# Patient Record
Sex: Male | Born: 1935 | Race: Black or African American | Hispanic: No | Marital: Married | Smoking: Never smoker
Health system: Southern US, Community
[De-identification: ages and names within clinical notes are randomized; demographics above are authoritative.]

## PROBLEM LIST (undated history)

## (undated) DIAGNOSIS — R4702 Dysphasia: Secondary | ICD-10-CM

## (undated) DIAGNOSIS — B1910 Unspecified viral hepatitis B without hepatic coma: Secondary | ICD-10-CM

## (undated) DIAGNOSIS — C801 Malignant (primary) neoplasm, unspecified: Secondary | ICD-10-CM

## (undated) DIAGNOSIS — I10 Essential (primary) hypertension: Secondary | ICD-10-CM

## (undated) DIAGNOSIS — F0390 Unspecified dementia without behavioral disturbance: Secondary | ICD-10-CM

## (undated) DIAGNOSIS — N189 Chronic kidney disease, unspecified: Secondary | ICD-10-CM

## (undated) DIAGNOSIS — R569 Unspecified convulsions: Principal | ICD-10-CM

## (undated) HISTORY — DX: Unspecified convulsions: R56.9

---

## 1997-08-25 ENCOUNTER — Emergency Department (HOSPITAL_COMMUNITY): Admission: EM | Admit: 1997-08-25 | Discharge: 1997-08-25 | Payer: Self-pay | Admitting: Emergency Medicine

## 1999-02-10 ENCOUNTER — Encounter: Admission: RE | Admit: 1999-02-10 | Discharge: 1999-02-10 | Payer: Self-pay | Admitting: Internal Medicine

## 1999-09-13 ENCOUNTER — Encounter: Payer: Self-pay | Admitting: Urology

## 1999-09-13 ENCOUNTER — Ambulatory Visit (HOSPITAL_COMMUNITY): Admission: RE | Admit: 1999-09-13 | Discharge: 1999-09-13 | Payer: Self-pay | Admitting: Urology

## 2004-02-15 ENCOUNTER — Ambulatory Visit: Payer: Self-pay | Admitting: Internal Medicine

## 2004-05-30 ENCOUNTER — Ambulatory Visit: Payer: Self-pay | Admitting: Internal Medicine

## 2004-08-29 ENCOUNTER — Ambulatory Visit: Payer: Self-pay | Admitting: Internal Medicine

## 2004-09-23 ENCOUNTER — Emergency Department (HOSPITAL_COMMUNITY): Admission: EM | Admit: 2004-09-23 | Discharge: 2004-09-24 | Payer: Self-pay | Admitting: Emergency Medicine

## 2004-11-20 ENCOUNTER — Ambulatory Visit: Payer: Self-pay | Admitting: Adult Health

## 2005-03-12 ENCOUNTER — Ambulatory Visit: Payer: Self-pay | Admitting: Internal Medicine

## 2005-04-12 ENCOUNTER — Ambulatory Visit: Payer: Self-pay | Admitting: Pulmonary Disease

## 2005-07-03 ENCOUNTER — Ambulatory Visit: Payer: Self-pay | Admitting: Internal Medicine

## 2005-07-10 ENCOUNTER — Ambulatory Visit: Payer: Self-pay | Admitting: Internal Medicine

## 2005-07-13 ENCOUNTER — Ambulatory Visit: Payer: Self-pay | Admitting: Internal Medicine

## 2005-07-27 ENCOUNTER — Ambulatory Visit: Payer: Self-pay | Admitting: Internal Medicine

## 2005-09-03 ENCOUNTER — Ambulatory Visit: Payer: Self-pay | Admitting: Internal Medicine

## 2005-10-15 ENCOUNTER — Ambulatory Visit: Payer: Self-pay | Admitting: Internal Medicine

## 2005-11-19 ENCOUNTER — Ambulatory Visit: Payer: Self-pay | Admitting: Internal Medicine

## 2006-01-07 ENCOUNTER — Ambulatory Visit: Payer: Self-pay | Admitting: Internal Medicine

## 2006-04-08 ENCOUNTER — Ambulatory Visit: Payer: Self-pay | Admitting: Internal Medicine

## 2006-04-22 ENCOUNTER — Ambulatory Visit: Payer: Self-pay | Admitting: Internal Medicine

## 2006-07-17 ENCOUNTER — Ambulatory Visit: Payer: Self-pay | Admitting: Internal Medicine

## 2006-07-26 ENCOUNTER — Ambulatory Visit: Payer: Self-pay | Admitting: Internal Medicine

## 2007-01-16 DIAGNOSIS — Z87898 Personal history of other specified conditions: Secondary | ICD-10-CM

## 2008-01-04 ENCOUNTER — Emergency Department (HOSPITAL_COMMUNITY): Admission: EM | Admit: 2008-01-04 | Discharge: 2008-01-04 | Payer: Self-pay | Admitting: Emergency Medicine

## 2008-10-18 ENCOUNTER — Ambulatory Visit: Payer: Self-pay | Admitting: Diagnostic Radiology

## 2008-10-18 ENCOUNTER — Emergency Department (HOSPITAL_BASED_OUTPATIENT_CLINIC_OR_DEPARTMENT_OTHER): Admission: EM | Admit: 2008-10-18 | Discharge: 2008-10-18 | Payer: Self-pay | Admitting: Emergency Medicine

## 2008-10-21 ENCOUNTER — Emergency Department (HOSPITAL_BASED_OUTPATIENT_CLINIC_OR_DEPARTMENT_OTHER): Admission: EM | Admit: 2008-10-21 | Discharge: 2008-10-21 | Payer: Self-pay | Admitting: Emergency Medicine

## 2009-09-16 ENCOUNTER — Ambulatory Visit: Payer: Self-pay | Admitting: Hematology and Oncology

## 2009-09-20 ENCOUNTER — Other Ambulatory Visit: Admission: RE | Admit: 2009-09-20 | Discharge: 2009-09-20 | Payer: Self-pay | Admitting: Hematology and Oncology

## 2009-09-20 LAB — CBC & DIFF AND RETIC
Basophils Absolute: 0 10*3/uL (ref 0.0–0.1)
Eosinophils Absolute: 0.1 10*3/uL (ref 0.0–0.5)
HCT: 38.8 % (ref 38.4–49.9)
HGB: 13 g/dL (ref 13.0–17.1)
MCV: 97.2 fL (ref 79.3–98.0)
MONO%: 8.4 % (ref 0.0–14.0)
NEUT#: 1 10*3/uL — ABNORMAL LOW (ref 1.5–6.5)
NEUT%: 31.2 % — ABNORMAL LOW (ref 39.0–75.0)
RDW: 13.8 % (ref 11.0–14.6)
Retic %: 0.84 % (ref 0.50–1.60)
Retic Ct Abs: 33.52 10*3/uL (ref 24.10–77.50)

## 2009-09-20 LAB — MORPHOLOGY

## 2009-09-22 LAB — COMPREHENSIVE METABOLIC PANEL
ALT: 12 U/L (ref 0–53)
Alkaline Phosphatase: 82 U/L (ref 39–117)
Sodium: 143 mEq/L (ref 135–145)
Total Bilirubin: 0.6 mg/dL (ref 0.3–1.2)
Total Protein: 7 g/dL (ref 6.0–8.3)

## 2009-09-22 LAB — PROTEIN ELECTROPHORESIS, SERUM
Beta 2: 17.1 % — ABNORMAL HIGH (ref 3.2–6.5)
Beta Globulin: 5.2 % (ref 4.7–7.2)
Gamma Globulin: 6.4 % — ABNORMAL LOW (ref 11.1–18.8)
M-Spike, %: 0.74 g/dL

## 2009-09-22 LAB — LACTATE DEHYDROGENASE: LDH: 131 U/L (ref 94–250)

## 2009-09-23 ENCOUNTER — Ambulatory Visit (HOSPITAL_COMMUNITY): Admission: RE | Admit: 2009-09-23 | Discharge: 2009-09-23 | Payer: Self-pay | Admitting: Hematology and Oncology

## 2009-09-29 ENCOUNTER — Other Ambulatory Visit: Admission: RE | Admit: 2009-09-29 | Discharge: 2009-09-29 | Payer: Self-pay | Admitting: Hematology and Oncology

## 2009-09-29 LAB — CBC WITH DIFFERENTIAL/PLATELET
BASO%: 0.5 % (ref 0.0–2.0)
Basophils Absolute: 0 10*3/uL (ref 0.0–0.1)
EOS%: 3.4 % (ref 0.0–7.0)
Eosinophils Absolute: 0.1 10*3/uL (ref 0.0–0.5)
HCT: 36.5 % — ABNORMAL LOW (ref 38.4–49.9)
HGB: 12.2 g/dL — ABNORMAL LOW (ref 13.0–17.1)
LYMPH%: 51.9 % — ABNORMAL HIGH (ref 14.0–49.0)
MCH: 33.2 pg (ref 27.2–33.4)
MCHC: 33.4 g/dL (ref 32.0–36.0)
MCV: 99.3 fL — ABNORMAL HIGH (ref 79.3–98.0)
MONO#: 0.3 10*3/uL (ref 0.1–0.9)
MONO%: 9.9 % (ref 0.0–14.0)
NEUT#: 1 10*3/uL — ABNORMAL LOW (ref 1.5–6.5)
NEUT%: 34.3 % — ABNORMAL LOW (ref 39.0–75.0)
Platelets: 124 10*3/uL — ABNORMAL LOW (ref 140–400)
RBC: 3.68 10*6/uL — ABNORMAL LOW (ref 4.20–5.82)
RDW: 14.4 % (ref 11.0–14.6)
WBC: 2.9 10*3/uL — ABNORMAL LOW (ref 4.0–10.3)
lymph#: 1.5 10*3/uL (ref 0.9–3.3)

## 2009-09-30 LAB — PROTEIN ELECTROPHORESIS, SERUM, WITH REFLEX
Alpha-1-Globulin: 3.4 % (ref 2.9–4.9)
Alpha-2-Globulin: 7.7 % (ref 7.1–11.8)
Beta 2: 16.6 % — ABNORMAL HIGH (ref 3.2–6.5)
Beta Globulin: 5.3 % (ref 4.7–7.2)
Gamma Globulin: 6.6 % — ABNORMAL LOW (ref 11.1–18.8)

## 2009-09-30 LAB — IFE INTERPRETATION

## 2009-09-30 LAB — IGG, IGA, IGM: IgG (Immunoglobin G), Serum: 621 mg/dL — ABNORMAL LOW (ref 694–1618)

## 2009-10-14 LAB — CBC WITH DIFFERENTIAL/PLATELET
BASO%: 0.3 % (ref 0.0–2.0)
Basophils Absolute: 0 10*3/uL (ref 0.0–0.1)
EOS%: 4 % (ref 0.0–7.0)
Eosinophils Absolute: 0.1 10*3/uL (ref 0.0–0.5)
HCT: 36.4 % — ABNORMAL LOW (ref 38.4–49.9)
HGB: 12.5 g/dL — ABNORMAL LOW (ref 13.0–17.1)
LYMPH%: 55.1 % — ABNORMAL HIGH (ref 14.0–49.0)
MCH: 34.3 pg — ABNORMAL HIGH (ref 27.2–33.4)
MCHC: 34.3 g/dL (ref 32.0–36.0)
MCV: 100.1 fL — ABNORMAL HIGH (ref 79.3–98.0)
MONO#: 0.3 10*3/uL (ref 0.1–0.9)
MONO%: 9.6 % (ref 0.0–14.0)
NEUT#: 0.8 10*3/uL — ABNORMAL LOW (ref 1.5–6.5)
NEUT%: 31 % — ABNORMAL LOW (ref 39.0–75.0)
Platelets: 108 10*3/uL — ABNORMAL LOW (ref 140–400)
RBC: 3.64 10*6/uL — ABNORMAL LOW (ref 4.20–5.82)
RDW: 13.9 % (ref 11.0–14.6)
WBC: 2.7 10*3/uL — ABNORMAL LOW (ref 4.0–10.3)
lymph#: 1.5 10*3/uL (ref 0.9–3.3)

## 2010-02-24 ENCOUNTER — Encounter: Admission: RE | Admit: 2010-02-24 | Discharge: 2010-02-24 | Payer: Self-pay | Admitting: Geriatric Medicine

## 2010-04-15 ENCOUNTER — Other Ambulatory Visit: Payer: Self-pay | Admitting: Geriatric Medicine

## 2010-04-15 DIAGNOSIS — N281 Cyst of kidney, acquired: Secondary | ICD-10-CM

## 2010-05-29 ENCOUNTER — Inpatient Hospital Stay: Admission: RE | Admit: 2010-05-29 | Payer: Self-pay | Source: Ambulatory Visit

## 2010-06-11 LAB — CHROMOSOME ANALYSIS, BONE MARROW

## 2010-06-11 LAB — BONE MARROW EXAM

## 2010-08-11 NOTE — Assessment & Plan Note (Signed)
[facility]                                   ON-CALL NOTE   TENZIN, EDELMAN                  MRN:          [id number]  DATE:11/18/2005                            DOB:          1923-04-02    SUBJECTIVE:  Ms. Madeira called tonight concerned about her husband's  blood pressure.  He has had increasing lower extremity edema and had a blood  pressure of approximately 200/95 by a fairly new blood pressure monitor.  The patient usually takes Toprol and Cardura twice a day but has not had his  evening dose.  He does not take Lasix or hydrochlorothiazide.   IMPRESSION:  Hypertension with poor control at this time.  I have asked the  patient's wife to go ahead and have him take his evening Toprol as well as  Cardura now, and he is to call first thing in the morning for an appointment  to see Dr. Sandrea Hughs for further management of his blood pressure.  More  than likely he will need a diuretic.  The patient is to call back if he has  further difficulties or is not improving.                                   Barbaraann Share, MD, FCCP   [facility]/MedQ  DD:  11/18/2005  DT:  11/19/2005  Job #:  [id number]   cc:   [name]. Sherene Sires, MD, FCCP

## 2010-08-11 NOTE — Assessment & Plan Note (Signed)
[facility]                             PULMONARY OFFICE NOTE   SUTTER, AHLGREN                  MRN:          [id number]  DATE:07/26/2006                            DOB:          06/29/1923    HISTORY OF PRESENT ILLNESS:  The patient is an 77 year old, African-  American male patient of Dr. Thurston Hole who has a known history of mild  dementia previously evaluated by Dr. Sandria Manly in 2001. 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 A couple years later in  April 2003, the patient had felt that his memory issues were starting to  increase somewhat and subsequently the patient was started on Aricept 10  mg. The patient at that time mini mental status exam was 27/30. Blood  work was essentially unremarkable and a CT scan per Dr. Imagene Gurney note had  showed some mild atrophy which was not out of proportion for the  patient's age. Followup mental mini mental status exam had actually  improved with serial testing showing improved clock drawing and scores  of 28-29/30 on testing. Two years later the patient was placed on  Namenda in April 2005 and the patient has maintained  without  significant change over the last 3 years and last mini mental state exam  score 29/30 and clock drawing was normal. The patient has been very high  functioning and participates in church activities, travels, drives and  has not noticed any loss in memory. Conversation is normal at exam.  However, one month ago, the patient was accompanied by his wife to a  visit who complained that patient has been losing his patience quite  often and has become agitated at activities where things were very  stressful for him. Today upon further questioning, the patient is an  extremely busy person and gets frustrated when things do not go as  planned. This is not a new finding with this patient after  much  discussion and has been like this most of his life. The wife has not  noticed any decline in memory or confusion at times just that he has  become agitated at different activities a little more than usual. The  patient denies any new stresses, depression-like symptoms, urinary  symptoms, cough, shortness of breath, fever, palpitations, presyncopal  or syncopal episodes.   PAST MEDICAL HISTORY:  1. Hypertension.  2. Chronic rhinitis.  3. BPH.  4. Chronic constipation.  5. Family history of Alzheimer's in a mother and a cousin.   CURRENT MEDICATIONS:  1. Aspirin 325 mg daily.  2. Citrucel daily.  3. Aricept 10 mg daily.  4. Namenda 10 mg b.i.d.  5. Toprol XL 25 mg 1/2 b.i.d.  6. Cardura 8 mg b.i.d.  7. Multivitamin daily.  8. Maxzide 75/50 daily.  9. Zyrtec 10 mg p.r.n.  10.Epipen p.r.n.  11.Sorbitol p.r.n.  12.Mucinex DM twice daily.   PHYSICAL EXAMINATION:  GENERAL:  This is very pleasant, delightful,  African-American, male patient in no acute distress.  VITAL SIGNS:  He is afebrile with blood pressure 132/88, pulse is 61, O2  saturation is 97% on room air.  HEENT:  Unremarkable.  NECK:  Supple without cervical adenopathy. No JVD.  LUNGS:  Lung sounds are clear to auscultation bilaterally.  CARDIAC:  S1, S2 without murmurs, rubs or gallops.  ABDOMEN:  Soft and nontender, no palpable hepatosplenomegaly. No  guarding or rebound noted.  EXTREMITIES:  Warm without any calf tenderness, cyanosis, clubbing or  edema.  NEUROLOGIC:  Alert and oriented x3. No focal deficits are detected.   LABORATORY DATA:  A mini mental state exam shows essentially a 29/30  score; however, the patient did redo his spelling of world backwards  correctly the second time which is actually an improvement from previous  testing. Recall was all 3 objects this time as well. Clock drawing test  was normal without any significant change. Conversation throughout the  exam was normal.    IMPRESSION/PLAN:  1. Mild dementia with recent intermittent episodes of agitation which      is actually quite questionable whether this is just the patient's      personality and/or new symptomatology of his dementia. Have offered      the patient to be given some low dose sedatives; however, they      decline at this time and also have recommended that we could      actually refer back to Dr. Sandria Manly for another followup. However at      this time, the patient and wife feel that the patient's memory is      well controlled and has stabilized on Namenda and Aricept and the      patient is encouraged to try stress reducing exercises. He keeps      very active with his schedule at church and hobbies; however, I      have recommended that he not over commit himself which is quite      stressful for this patient. At this time, we will keep his regimen      the same and the patient wife will contact us if they want to see      Dr. Sandria Manly and/or have some low-dose sedative for anxiety symptoms.  2. Complex medication regimen. The patient's medications were reviewed      in detail. Patient education was provided. The patient has been      recommended to discontinue fish oil and      Gingko Biloba at this time and the patient's computerized      medication calendar was adjusted accordingly and patient education      was provided.      Rubye Oaks, NP  Electronically Signed      [name]. Sherene Sires, MD, [facility]  Electronically Signed   TP/MedQ  DD: 07/26/2006  DT: 07/26/2006  Job #: [id number]

## 2010-08-11 NOTE — Assessment & Plan Note (Signed)
[facility]                               PULMONARY OFFICE NOTE   Darryl Ryan, Darryl Ryan                  MRN:          [id number]  DATE:11/19/2005                            DOB:          1923/04/09    This is a extended follow-up office visit.   HISTORY:  A 77 year old black male with hypertension and mild memory loss,  confirmed as dementia by Dr. Sandria Manly in 2000 and maintained on Aricept with a  combination of Namenda with improvement/stabilization by MMSE, documented by  the nurse practitioner.  He comes in with multiple complaints today with his  wife present.  One is that he sleeps erratically, and his wife notices that  he is very restless.  The patient is mildly hypersomnolent during the day  and sleeps an erratic schedule, getting up early to play golf and then  sleeping late on the days he does not play golf.   He is also having trouble with increasing bilateral leg swelling for the  last month, previously controlled with Maxzide 75 1 daily.  After much  discussion, it turns out he actually is not taking this anymore and is not  sure when he stopped it relative to when the swelling began; however, the  swelling is symmetric, gets better overnight, and is not associated with any  orthopnea or increased dyspnea from baseline.   For full list of medications, please see patient face sheet, dated November 19, 2005, and note that he has a very meticulous medication calendar that he  himself constructed, which mimics our calendar, almost 100% with the one  problem being that it does not actually match up with the bag of pills that  he has and the Maxzide is not in the bag.   PHYSICAL EXAMINATION:  GENERAL:  He is a pleasant, ambulatory black male who  states that he did not take any of his morning medicines today before coming  to the clinic.  VITAL SIGNS:  Blood pressure 156/94, pulse rate 59.  HEENT:  Unremarkable.  Oropharynx is clear.  LUNGS:  Lung fields are completely clear bilaterally to auscultation and  percussion.  HEART:  Regular rhythm without murmur, rub or gallop.  No increase in P2 or  S3.  ABDOMEN:  Soft, benign with no palpable ascites.  Excursion was normal in  supine position.  EXTREMITIES:  Warm without calf tenderness, clubbing, or cyanosis.  There  was 1+ edema bilaterally.   Lab data was reviewed as recently as July 03, 2005.  No significant  decrease in albumin, no reduction in renal function, and a normal BNP and  TSH.   IMPRESSION:  1. Short term memory loss has improved on the combination of Aricept and      Namenda, which I have asked him to continue and explained why.  2. Hypertension is poorly controlled, due to nonadherence.  (See problem      #3).  3. Inconsistent about following the medication calendar.  Patient should      have already taken Maxzide 75 and his Toprol this morning before coming  to the office and did neither.  I emphasized to him the reason that he      has a calendar is so he can organize his medicines and remember if it      says to take eight pills in the morning, that all eight pills should be      taken first thing in the morning, as the calendar indicates with      p.r.n.'s listed separately.  4. New onset leg swelling I believe is simply secondary to medication      error and should correct it along with blood pressure if he takes the      medicines as they are prescribed and avoids excess salt, which I      reviewed with him also in detail.   I spent an extra 15-20 minutes with his wife and him going over a day in  his life, reading upside down the list to him to show him how user friendly  and easy it is to self-administer medicines if he simply follows the list.   The only loose end was the new complaint of apparent restlessness at night  with poor sleep hygiene.  I reviewed sleep hygiene issues with the patient,  including setting regular hours to go  to bed and wake up but do feel a sleep  study is indicated to explore the quality of his sleep and ask the question  whether the restlessness is due to sleep apnea or primary restless leg  syndrome, which of course could be treated medically.  Follow-up has already  been arranged for six weeks.  We will see him sooner as needed.  I have  asked them to call me if the edema does not resolve and the blood pressure  normalize (he self-monitors blood pressure) following the instructions I  reviewed with him and his wife today in detail.                                   Darryl Ryan. Darryl Sires, MD, [facility]   MBW/MedQ  DD:  11/19/2005  DT:  11/19/2005  Job #:  [id number]

## 2010-08-11 NOTE — Assessment & Plan Note (Signed)
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  He complains of fatigue that happens after lunch if he is not  keeping himself busy.  He also complains of leg swelling during the day  and for some reason misunderstood the Maxzide instructions and is taking it  in the evening instead of in the morning.  He denies any excessive sleep  disruption but he gets up several times a night to urinate.  It turns out  that he gets up several times a night to urinate related to the Maxzide use  in the evening.  He states he wakes up feeling fine, however, in the morning  and feels well-rested and in the afternoon simply gets sleepy if he is not  stimulated.   For full list of medications, please see face sheet dated October 15, 2005, but  no comments below regarding generics.   The patient denies any exertional chest pain, orthopnea, PND, GI or  claudication symptoms.   PHYSICAL EXAMINATION:  GENERAL:  He is a robust, pleasant-appearing  ambulatory black man in no acute distress.  VITAL SIGNS:  His blood pressure is 140/82 after reporting taking his blood  pressure medicines (except for the Maxzide) this morning.  HEENT:  Unremarkable.  Pharynx clear.  NECK:  Supple without cervical adenopathy or tenderness.  Trachea is  midline.  LUNGS:  Lung fields perfectly clear bilaterally __________  CARDIAC:  Regular rhythm without murmur, gallop, or rub.  ABDOMEN:  Soft, benign.  EXTREMITIES:  Warm, no calf tenderness, cyanosis or  clubbing.  There was  trace edema.   Lab studies were reviewed from April 2007 indicate that the patient had a  normal BMP and TSH with a BUN of 12 and creatinine of 1.6.   IMPRESSION:  1.  Hypertension, complicated by mild renal insufficiency and a tendency to      peripheral edema chronically.  Of note, he has no evidence of heart      failure clinically nor by BNP or chest x-ray.  Therefore, rather than      reevaluate him for the edema, I am simply going to ask him to take the      Maxzide-75 appropriately, that is, one every morning, rather than      waiting until the evening, which may help problem #2.  2.  Afternoon sleepiness if not stimulated.  May be related to sleep      disruption related to diuretic.  By taking the Maxzide in the morning,      this problem should be solved.  If not, a sleep study could be pursued.  Note, however, that when the patient is stimulated, including driving a      car, he has no trouble with hypersomnolence.  3.  Difficulty following instructions.  I went line by line over his entire      medicine calendar with him.  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  Also, I found      out today that many of his medicines are written in generic form and do      not correlate with the calendar.  I made the changes on the calendar so      that by the end of the visit there was 100% correlation between his      medicine calendar and all of his medicines.   I have asked him to make an appointment to see [place] in 3 months, at which time  we need to recheck his renal function with a BMET and see him sooner if the  edema or the fatigue/sleepiness do not improve.                                   [name]. Sherene Sires, MD, [facility]   MBW/MedQ  DD:  10/15/2005  DT:  10/15/2005  Job #:  [id number]

## 2010-08-11 NOTE — Assessment & Plan Note (Signed)
[facility]                             PULMONARY OFFICE NOTE   LESTON, SCHUELLER                  MRN:          [id number]  DATE:07/17/2006                            DOB:          05-31-1923    HISTORY:  An 77 year old black male here for followup of hypertension  and dementia (the records indicate that he has seen Dr. Sandria Manly for  dementia and is on Aricept and Namenda) with increased agitation after  taking his Aricept and Namenda every morning for the last several weeks.  However, at the same time, he can go out and play golf all day without  difficultly. He denies any difficultly with sleeping, overt depression,  headaches, fevers, chills, sweats, chest pain, or leg swelling.   For full inventory of medications please MAR dated July 17, 2006 which  I reviewed with the patient in detail.   On physical examination, he is a pleasant ambulatory male in no acute  distress. He is afebrile, stable vital signs.  HEENT: Unremarkable. Pharynx clear.  LUNG FIELDS: Clear bilaterally to auscultation  and percussion.  There is a regular rate and rhythm without murmur, gallop, or rub.  ABDOMEN: Soft, benign.  EXTREMITIES: Warm without calf tenderness, cyanosis, clubbing, or edema.   IMPRESSION:  1. Hypertension is well controlled.  2. Dementia with intermittent agitation that may or may not be related      to medications. When I attempted to try and do medication      reconciliation, I realized the patient did not actually have the      correct sheet that was given to him and so I am not confident that      the above medication inventory is correct at all. In fact, the more      I talk to him the more sure I became that both he and his wife did      not benefit from medication reconciliation which was attempted by      our nurse practitioner on January 28. Therefore before referring      him back to [facility] for an adverse drug effect versus dementia     effects, I am going to recommend we first do full medication      reconciliation by asking the patient to bring his medications in 2      bags, those that he takes perfectly regularly, and the rest of      those he takes p.r.n. for review by our nurse practitioner and then      referal on to Dr. Sandria Manly if needed. I spent almost 30 minutes with      this patient and his wife today trying to work through this problem      and to help them understand that if he is going to continue to      consider himself a primary care patient here that we need to      maintain full medication reconciliation with each visit, and he      needs to follow the instructions at  home as they written in the  form of a pill box for which we can review not only the  2      different bags of pills but review the pill box on the day he is      seen in      the visit to be sure he is keeping up with how he takes his      medicines, and the pill box      should only be filled once a week so that when he returns to the      office by the day of the week we can tell whether or not he is      taking his medicines effectively.     [name]. Sherene Sires, MD, [facility]  Electronically Signed    MBW/MedQ  DD: 07/17/2006  DT: 07/17/2006  Job #: [id number]

## 2010-08-11 NOTE — Assessment & Plan Note (Signed)
[facility]                             PULMONARY OFFICE NOTE   Darryl Ryan, Darryl Ryan                  MRN:          [id number]  DATE:04/22/2006                            DOB:          [birth year]    HISTORY OF PRESENT ILLNESS:  The patient is an 77 year old African-  American male patient of Dr. Sherene Sires, who has a known history of  hypertension and dementia, who presents for a 2-week followup.  Last  visit, the patient had been off of his Maxzide due to a mix-up with his  insurance company.  The patient has recently refilled this and has  noticed resolution of lower extremity swelling and decreased blood  pressures.  Since last visit the patient reports he is doing well  without any new complaints.  The patient has brought all of his  medications in today, which are correct with his medication list.   PAST MEDICAL HISTORY:  Reviewed.   CURRENT MEDICATIONS:  Reviewed.   PHYSICAL EXAM:  The patient is a pleasant male in no acute distress.  He is afebrile with stable vital signs.  HEENT:  Unremarkable.  NECK:  Supple without anemopathy.  LUNGS:  Clear.  CARDIAC:  Regular rate.  ABDOMEN:  Soft and benign.  EXTREMITIES:  Warm without any edema.   IMPRESSION AND PLAN:  1. Hypertension and fluid retention secondary to discontinuation of      Maxzide.  This is all now resolved with restarting his diuretic.      The patient will continue on his present regimen.  He will continue      on a low sodium diet.  The patient will follow back up in 3 months      or sooner if needed.  2. Complex medication regimen.  The patient's medications are reviewed      in detail.  Patient education is provided.  The patient's      computerized medication calendar was adjusted accordingly and      reviewed with the patient.  3. Mild dementia.  Currently on namenda and Aricept.  The patient will      return here in 3 months for a mini mental state exam or sooner if   needed.      Rubye Oaks, NP  Electronically Signed      Darryl Ryan. Sherene Sires, MD, [facility]  Electronically Signed   TP/MedQ  DD: 04/22/2006  DT: 04/22/2006  Job #: [id number]

## 2010-08-11 NOTE — Assessment & Plan Note (Signed)
[facility]                             PULMONARY OFFICE NOTE   MUNIR, Darryl Ryan                  MRN:          [id number]  DATE:04/08/2006                            DOB:          Jun 17, 1923    HISTORY:  An 77 year old, black male with hypertension and mild dementia  returns for followup evaluation having not been consistent about taking  his medicines per the medication calendar that he carries with him.  For  some reason, his Maxzide was not refilled by his pharmacist and  it is  not clear whether this was the generic or not.  Since his Maxzide ran  out several weeks ago, he has been noticing increasing swelling in his  ankles and his blood pressure is as high as 200/100 on his monitoring.  However, he denies any chest pain, orthopnea, PND, or leg swelling.   PHYSICAL EXAMINATION:  He is a pleasant, ambulatory, black male in no  acute distress.  He is afebrile, stable vital signs.  HEENT:  Unremarkable.  Oropharynx pharynx.  Lung fields revealed diminished breath sounds bilaterally but no  wheezing.  There is a regular rhythm without murmur, gallop, or rub with no S3.  ABDOMEN:  Soft, benign but no palpable organomegaly including aortic  enlargement.  EXTREMITIES:  Warm without calf tenderness, cyanosis, or clubbing.  He  does have trace edema in his ankles only.   IMPRESSION:  Fluid retention and intermittent hypertension are both  related to non-adherence with Maxzide.  It is not clear to me why he  could not get the prescription refilled.  I gave him a script for  generic Maxzide and told him that if his insurance will not pay for it  that he may find that it is cheaper just to buy it on his own rather  than to go jumping through hoops, and I enlisted his wife to either  supply [place] with an accurate updated formulary of what his insurance will  pay for or he always has the option of filling the medications outside  of his insurance  plan.   However, because of the confusion with his medicines and the whole  notion of medication reconciliation, I have asked him to return in 2  weeks with his medicines and the medication calendar for full  reconciliation at that time.  If possible, I would like the patient to  bring a formulary with him so we can find out what his insurance will  reimburse him for in terms of antihypertensive medications.   When his medication calendar is redone, we need to make sure that the  names on the calendar line up with the names on his bottles, noting that  the patient has multiple generic products that do not agree with the  calendar he carries with him and that he and his wife do not appear to  have the insight to work through this and for some reason has not  elicited the help of the pharmacist to solve this problem, which I've  encouraged him to do in the future.     Darryl Ryan. Wert,  MD, [facility]  Electronically Signed   MBW/MedQ  DD: 04/08/2006  DT: 04/09/2006  Job #: [id number]

## 2010-08-11 NOTE — Assessment & Plan Note (Signed)
[facility]                               PULMONARY OFFICE NOTE   Darryl Ryan, Darryl Ryan                  MRN:          [id number]  DATE:01/07/2006                            DOB:          [birth year]    HISTORY OF PRESENT ILLNESS:  The patient is a 77 year old African American  male patient of Dr. Sherene Ryan with a known history of hypertension and dementia.  The patient returns today for a two-week follow up.  Last visit, the patient  had complained of some increased lower extremity swelling.  The patient was  recommended to restart his Maxzide and his symptoms have resolved nicely  with reinstitution of his diuretic.  The patient had also complained of some  episodes of difficulty sleeping and questionable sleep apnea and had been  recommended to go to a sleep study.  However, the patient reports that since  his last visit that patient had changed over some of his different sleep  practices and is sleeping much better now and wants to wait a few months and  see if his symptoms return and then we will go for a sleep study if in fact  they do.  The patient has brought all of his medications in today for review  which are current with our medication list.  Past medical history is  reviewed.  Current medications reviewed.   PHYSICAL EXAMINATION:  The patient is a pleasant male in no acute distress.  He is afebrile with stable vital signs.  O2 saturation is 97% on room air.  HEENT:  Unremarkable.  NECK:  Supple without adenopathy.  LUNGS:  Sounds are clear.  CARDIAC:  Regular rate.  ABDOMEN:  Soft.  EXTREMITIES:  Upper extremities warm with trace edema.   IMPRESSION/PLAN:  1. Hypertension and lower extremity edema, improved on his current      regimen:  The patient will follow up back up with Dr. Sherene Ryan as scheduled      to see him.  2. Dementia:  Currently well compensated on Aricept and Namenda.  The      patient will follow up as scheduled.  3.  Complex medication regimen:  The patient's medications were reviewed in      detail.  The patient education was provided.  A computerized medication      calendar was adjusted and reviewed with this patient.  4. Mild sleep disturbances:  Seems to be improving now with better sleep,      hygiene practices.      We will continue to monitor and determine if need to proceed with this      sleep study in the future.    ______________________________  Darryl Oaks, NP    ______________________________  Darryl Dalton. Sherene Sires, MD, Tonny Bollman   TP/MedQ DD:  01/10/2006 DT:  01/12/2006 Job #:  [id number]

## 2011-07-31 ENCOUNTER — Other Ambulatory Visit: Payer: Self-pay | Admitting: Geriatric Medicine

## 2011-07-31 ENCOUNTER — Ambulatory Visit
Admission: RE | Admit: 2011-07-31 | Discharge: 2011-07-31 | Disposition: A | Discharge status: Home / Self Care | Payer: Medicare Other | Source: Ambulatory Visit | Attending: Geriatric Medicine | Admitting: Geriatric Medicine

## 2011-07-31 DIAGNOSIS — N281 Cyst of kidney, acquired: Secondary | ICD-10-CM

## 2013-04-30 ENCOUNTER — Encounter (HOSPITAL_BASED_OUTPATIENT_CLINIC_OR_DEPARTMENT_OTHER): Payer: Self-pay | Admitting: Emergency Medicine

## 2013-04-30 ENCOUNTER — Emergency Department (HOSPITAL_BASED_OUTPATIENT_CLINIC_OR_DEPARTMENT_OTHER)
Admission: EM | Admit: 2013-04-30 | Discharge: 2013-04-30 | Disposition: A | Discharge status: Home / Self Care | Payer: Medicare Other | Attending: Emergency Medicine | Admitting: Emergency Medicine

## 2013-04-30 ENCOUNTER — Emergency Department (HOSPITAL_BASED_OUTPATIENT_CLINIC_OR_DEPARTMENT_OTHER): Payer: Medicare Other

## 2013-04-30 DIAGNOSIS — J209 Acute bronchitis, unspecified: Secondary | ICD-10-CM | POA: Insufficient documentation

## 2013-04-30 DIAGNOSIS — I10 Essential (primary) hypertension: Secondary | ICD-10-CM | POA: Insufficient documentation

## 2013-04-30 DIAGNOSIS — Z8619 Personal history of other infectious and parasitic diseases: Secondary | ICD-10-CM | POA: Insufficient documentation

## 2013-04-30 DIAGNOSIS — J40 Bronchitis, not specified as acute or chronic: Secondary | ICD-10-CM

## 2013-04-30 DIAGNOSIS — F0390 Unspecified dementia without behavioral disturbance: Secondary | ICD-10-CM | POA: Insufficient documentation

## 2013-04-30 HISTORY — DX: Unspecified dementia, unspecified severity, without behavioral disturbance, psychotic disturbance, mood disturbance, and anxiety: F03.90

## 2013-04-30 HISTORY — DX: Unspecified viral hepatitis B without hepatic coma: B19.10

## 2013-04-30 HISTORY — DX: Essential (primary) hypertension: I10

## 2013-04-30 LAB — BASIC METABOLIC PANEL
BUN: 14 mg/dL (ref 6–23)
CHLORIDE: 100 meq/L (ref 96–112)
CO2: 29 mEq/L (ref 19–32)
Calcium: 9 mg/dL (ref 8.4–10.5)
Creatinine, Ser: 1.2 mg/dL (ref 0.50–1.35)
GFR, EST AFRICAN AMERICAN: 61 mL/min — AB (ref >90)
GFR, EST NON AFRICAN AMERICAN: 53 mL/min — AB (ref >90)
Glucose, Bld: 156 mg/dL — ABNORMAL HIGH (ref 70–99)
POTASSIUM: 3.7 meq/L (ref 3.7–5.3)
SODIUM: 142 meq/L (ref 137–147)

## 2013-04-30 LAB — CBC WITH DIFFERENTIAL/PLATELET
BASOS PCT: 0 % (ref 0–1)
Basophils Absolute: 0 10*3/uL (ref 0.0–0.1)
EOS ABS: 0.2 10*3/uL (ref 0.0–0.7)
Eosinophils Relative: 6 % — ABNORMAL HIGH (ref 0–5)
HCT: 37.5 % — ABNORMAL LOW (ref 39.0–52.0)
Hemoglobin: 12.5 g/dL — ABNORMAL LOW (ref 13.0–17.0)
LYMPHS ABS: 1.3 10*3/uL (ref 0.7–4.0)
Lymphocytes Relative: 35 % (ref 12–46)
MCH: 34.2 pg — AB (ref 26.0–34.0)
MCHC: 33.3 g/dL (ref 30.0–36.0)
MCV: 102.7 fL — ABNORMAL HIGH (ref 78.0–100.0)
Monocytes Absolute: 0.5 10*3/uL (ref 0.1–1.0)
Monocytes Relative: 13 % — ABNORMAL HIGH (ref 3–12)
NEUTROS ABS: 1.7 10*3/uL (ref 1.7–7.7)
NEUTROS PCT: 46 % (ref 43–77)
PLATELETS: 99 10*3/uL — AB (ref 150–400)
RBC: 3.65 MIL/uL — AB (ref 4.22–5.81)
RDW: 12.4 % (ref 11.5–15.5)
WBC: 3.6 10*3/uL — ABNORMAL LOW (ref 4.0–10.5)

## 2013-04-30 MED ORDER — AZITHROMYCIN 250 MG PO TABS: ORAL_TABLET | ORAL | Status: DC

## 2013-04-30 NOTE — ED Notes (Signed)
[facility] [employer] , [facility]

## 2013-04-30 NOTE — Discharge Instructions (Signed)

## 2013-04-30 NOTE — ED Provider Notes (Signed)
CSN: [id number]     Arrival date & time 04/30/13  1531 History   First MD Initiated Contact with Patient 04/30/13 1556     Chief Complaint  Patient presents with  . Cough   (Consider location/radiation/quality/duration/timing/severity/associated sxs/prior Treatment) Patient is a 80 y.o. male presenting with cough.  Cough  Pt with history of HTN has had several days of gradually worsening nasal congestion, sinus drainage, productive cough and SOB. Denies any CP or fever. No leg swelling, vomiting or diarrhea. He has been taking OTC cold medications and has noticed increased BP the last few days.  Past Medical History  Diagnosis Date  . Hypertension   . Dementia   . Hepatitis B    History reviewed. No pertinent past surgical history. History reviewed. No pertinent family history. History  Substance Use Topics  . Smoking status: Never Smoker   . Smokeless tobacco: Not on file  . Alcohol Use: No    Review of Systems  Respiratory: Positive for cough.    All other systems reviewed and are negative except as noted in HPI.   Allergies  Bee venom  Home Medications  No current outpatient prescriptions on file. BP 180/82  Pulse 63  Temp(Src) 98.7 F (37.1 C) (Oral)  Resp 16  Ht 5\' 7"  (1.702 m)  Wt 170 lb (77.111 kg)  BMI 26.62 kg/m2  SpO2 99% Physical Exam  Nursing note and vitals reviewed. Constitutional: He is oriented to person, place, and time. He appears well-developed and well-nourished.  HENT:  Head: Normocephalic and atraumatic.  Eyes: EOM are normal. Pupils are equal, round, and reactive to light.  Neck: Normal range of motion. Neck supple.  Cardiovascular: Normal rate, normal heart sounds and intact distal pulses.   Pulmonary/Chest: Effort normal. He has no wheezes. He has no rales.  Abdominal: Bowel sounds are normal. He exhibits no distension. There is no tenderness.  Musculoskeletal: Normal range of motion. He exhibits no edema and no tenderness.   Neurological: He is alert and oriented to person, place, and time. He has normal strength. No cranial nerve deficit or sensory deficit.  Skin: Skin is warm and dry. No rash noted.  Psychiatric: He has a normal mood and affect.    ED Course  Procedures (including critical care time) Labs Review Labs Reviewed  CBC WITH DIFFERENTIAL - Abnormal; Notable for the following:    WBC 3.6 (*)    RBC 3.65 (*)    Hemoglobin 12.5 (*)    HCT 37.5 (*)    MCV 102.7 (*)    MCH 34.2 (*)    Platelets 99 (*)    Monocytes Relative 13 (*)    Eosinophils Relative 6 (*)    All other components within normal limits  BASIC METABOLIC PANEL - Abnormal; Notable for the following:    Glucose, Bld 156 (*)    GFR calc non Af Amer 53 (*)    GFR calc Af Amer 61 (*)    All other components within normal limits   Imaging Review Dg Chest 2 View  04/30/2013   CLINICAL DATA:  80 year old male hypertension in cough. Shortness of Breath. Initial encounter.  EXAM: CHEST  2 VIEW  COMPARISON:  Abdomen CT 08/16/2011.  FINDINGS: Lung volumes are within normal limits. Normal cardiac size and mediastinal contours. Visualized tracheal air column is within normal limits. Apical pleural/parenchymal scarring. No pneumothorax, pulmonary edema, pleural effusion or consolidation. No confluent pulmonary opacity. No acute osseous abnormality identified.  IMPRESSION: No acute cardiopulmonary  abnormality.   Electronically Signed   By: Lars Pinks M.D.   On: 04/30/2013 17:24    EKG Interpretation    Date/Time:  Thursday April 30 2013 16:22:34 EST Ventricular Rate:  65 PR Interval:  212 QRS Duration: 90 QT Interval:  438 QTC Calculation: 455 R Axis:   40 Text Interpretation:  Sinus rhythm with 1st degree A-V block Otherwise normal ECG No old tracing to compare Confirmed by [facility]  MD, CHARLES [phone]) on 04/30/2013 5:06:36 PM            MDM   1. Bronchitis   2. HYPERTENSION     Pt with URI/Bronchitis. High BP likely from  cold meds. Advised to discuss symptom treatment with PCP. Avoid stimulant decongestants. Will start Zpak, close PCP followup.    Charles B. Karle Starch, MD 04/30/13 [phone]

## 2013-04-30 NOTE — ED Notes (Signed)
Pt c/o hypertension with cough x 1 day

## 2013-09-07 ENCOUNTER — Emergency Department (HOSPITAL_BASED_OUTPATIENT_CLINIC_OR_DEPARTMENT_OTHER): Payer: Medicare Other

## 2013-09-07 ENCOUNTER — Encounter (HOSPITAL_BASED_OUTPATIENT_CLINIC_OR_DEPARTMENT_OTHER): Payer: Self-pay | Admitting: Emergency Medicine

## 2013-09-07 ENCOUNTER — Emergency Department (HOSPITAL_BASED_OUTPATIENT_CLINIC_OR_DEPARTMENT_OTHER)
Admission: EM | Admit: 2013-09-07 | Discharge: 2013-09-07 | Disposition: A | Discharge status: Home / Self Care | Payer: Medicare Other | Attending: Emergency Medicine | Admitting: Emergency Medicine

## 2013-09-07 DIAGNOSIS — I10 Essential (primary) hypertension: Secondary | ICD-10-CM | POA: Insufficient documentation

## 2013-09-07 DIAGNOSIS — Z79899 Other long term (current) drug therapy: Secondary | ICD-10-CM | POA: Insufficient documentation

## 2013-09-07 DIAGNOSIS — Z8619 Personal history of other infectious and parasitic diseases: Secondary | ICD-10-CM | POA: Insufficient documentation

## 2013-09-07 DIAGNOSIS — K5900 Constipation, unspecified: Secondary | ICD-10-CM | POA: Insufficient documentation

## 2013-09-07 DIAGNOSIS — F0390 Unspecified dementia without behavioral disturbance: Secondary | ICD-10-CM | POA: Insufficient documentation

## 2013-09-07 DIAGNOSIS — Z7982 Long term (current) use of aspirin: Secondary | ICD-10-CM | POA: Insufficient documentation

## 2013-09-07 MED ORDER — POLYETHYLENE GLYCOL 3350 17 G PO PACK
17.0000 g | PACK | Freq: Every day | ORAL | Status: DC
  Filled 2013-09-07: qty 1

## 2013-09-07 MED ORDER — MAGNESIUM CITRATE PO SOLN
1.0000 | Freq: Once | ORAL | Status: AC
  Administered 2013-09-07: 0.5 via ORAL
  Filled 2013-09-07: qty 296

## 2013-09-07 MED ORDER — FLEET ENEMA 7-19 GM/118ML RE ENEM
1.0000 | ENEMA | Freq: Once | RECTAL | Status: AC
  Administered 2013-09-07: 1 via RECTAL
  Filled 2013-09-07: qty 1

## 2013-09-07 NOTE — ED Notes (Signed)
Pt c/o constipation , reports last BM x 1 day ago . NO relief with OTC med

## 2013-09-07 NOTE — ED Notes (Signed)
Room found empty when checking to see if pt had been able to provide a urine sample. Pt's clothes gone and gown noted on bed. EDP Horton made aware. Pt did not inform staff he was leaving

## 2013-09-07 NOTE — ED Notes (Signed)
Pt left dept prior to receiving d/c instructions- did not inform staff he was leaving

## 2013-09-07 NOTE — ED Provider Notes (Signed)
CSN: [id number]     Arrival date & time 09/07/13  1454 History   This chart was scribed for Darryl Hacker, MD by Darryl Ryan, ED Scribe. The patient was seen in room MH08/MH08. Patient's care was started at 3:46 PM.   Chief Complaint  Patient presents with  . Constipation   The history is provided by the patient. No language interpreter was used.   HPI Comments: Darryl Ryan is a 80 y.o. male, with a h/o HTN, who presents to the Emergency Department complaining of gradually worsening constipation. Pt also reports associated rectal pain and straining when trying to use the restroom. Last BM was yesterday. Pt typically has a BM daily. He denies vomiting, abdominal pain, blood in stools, fever, urinary symptoms. Pt has tried [employer] tablets 4 hours ago. He denies tobacco or alcohol use. No known allergies.  PCP: Darryl Ryan   Past Medical History  Diagnosis Date  . Hypertension   . Dementia   . Hepatitis B    History reviewed. No pertinent past surgical history. History reviewed. No pertinent family history. History  Substance Use Topics  . Smoking status: Never Smoker   . Smokeless tobacco: Not on file  . Alcohol Use: No    Review of Systems  Constitutional: Negative.  Negative for fever.  Respiratory: Negative.  Negative for chest tightness and shortness of breath.   Cardiovascular: Negative.  Negative for chest pain.  Gastrointestinal: Positive for constipation. Negative for nausea, vomiting, abdominal pain and blood in stool.  Genitourinary: Negative.  Negative for dysuria.  Musculoskeletal: Negative for back pain.  Skin: Negative for rash.  Neurological: Negative for headaches.  All other systems reviewed and are negative.   Allergies  Bee venom  Home Medications   Prior to Admission medications   Medication Sig Start Date End Date Taking? Authorizing Provider  amLODipine (NORVASC) 10 MG tablet Take 10 mg by mouth daily.    Historical  Provider, MD  aspirin 325 MG tablet Take 325 mg by mouth daily.    Historical Provider, MD  azithromycin (ZITHROMAX) 250 MG tablet Use as directed 04/30/13   Charles B. Karle Starch, MD  donepezil (ARICEPT) 10 MG tablet Take 10 mg by mouth at bedtime.    Historical Provider, MD  lisinopril (PRINIVIL,ZESTRIL) 10 MG tablet Take 10 mg by mouth daily.    Historical Provider, MD  memantine (NAMENDA) 10 MG tablet Take 10 mg by mouth 2 (two) times daily.    Historical Provider, MD  metoprolol tartrate (LOPRESSOR) 25 MG tablet Take 25 mg by mouth 2 (two) times daily.    Historical Provider, MD  Multiple Vitamin (MULTIVITAMIN WITH MINERALS) TABS tablet Take 1 tablet by mouth daily.    Historical Provider, MD  vitamin A 7500 UNIT capsule Take 7,500 Units by mouth daily.    Historical Provider, MD  vitamin A 7500 UNIT capsule Take 7,500 Units by mouth daily.    Historical Provider, MD   Triage Vitals: BP 131/76  Pulse 65  Temp(Src) 98.4 F (36.9 C) (Oral)  Resp 16  Ht 5\' 7"  (1.702 m)  Wt 175 lb (79.379 kg)  BMI 27.40 kg/m2  SpO2 99% Physical Exam  Nursing note and vitals reviewed. Constitutional: He is oriented to person, place, and time. He appears well-developed and well-nourished.  Elderly  HENT:  Head: Normocephalic and atraumatic.  Mouth/Throat: Oropharynx is clear and moist.  Cardiovascular: Normal rate, regular rhythm and normal heart sounds.   No murmur heard.  Pulmonary/Chest: Effort normal and breath sounds normal. No respiratory distress. He has no wheezes.  Abdominal: Soft. Bowel sounds are normal. He exhibits no distension. There is no tenderness. There is no rebound.  Genitourinary: Rectum normal.  Normal rectal done, no evidence of external hemorrhoids, hard stool noted in the vault just beyond digital reach  Musculoskeletal: He exhibits no edema.  Lymphadenopathy:    He has no cervical adenopathy.  Neurological: He is alert and oriented to person, place, and time.  Skin: Skin is  warm and dry.  Psychiatric: He has a normal mood and affect.    ED Course  Procedures (including critical care time) DIAGNOSTIC STUDIES: Oxygen Saturation is 99% on RA, normal by my interpretation.    COORDINATION OF CARE: 3:51 PM Discussed treatment plan with pt at bedside and pt agreed to plan.  Labs Review Labs Reviewed  URINALYSIS, ROUTINE W REFLEX MICROSCOPIC   Imaging Review Dg Abd 1 View  09/07/2013   CLINICAL DATA:  Constipation and rectal pain  EXAM: ABDOMEN - 1 VIEW  COMPARISON:  CT scan of the abdomen and pelvis of Aug 16, 2011  FINDINGS: There is increased stool burden throughout the colon and rectum. There is a small amount of small bowel gas without evidence of obstruction. There are calcified stones overlying both kidneys. There are degenerative changes of the lumbar spine.  IMPRESSION: The bowel gas pattern is consistent with clinical constipation. There is no evidence of obstruction, ileus, or perforation. Urinary tract stones are present bilaterally.   Electronically Signed   By: David  Martinique   On: 09/07/2013 16:28    EKG Interpretation None      MDM   Final diagnoses:  Constipation    Patient presents with constipation. He is nontoxic on exam. Abdomen is soft and he has no symptoms of obstruction. He does have a hard stool in the rectal vault just beyond digital reach. KUB consistent with constipation. Patient was given an enema which resulted in a large bowel movement. Requested the patient provide urine to rule out tract infection as a cause of constipation. However, patient left prior to arriving urine and receiving formal discharge. Have low suspicion for other intra-abdominal pathology at this time.  I personally performed the services described in this documentation, which was scribed in my presence. The recorded information has been reviewed and is accurate.     Darryl Hacker, MD 09/07/13 [phone]

## 2013-09-17 ENCOUNTER — Encounter: Payer: Self-pay | Admitting: Gastroenterology

## 2014-10-26 ENCOUNTER — Encounter (HOSPITAL_BASED_OUTPATIENT_CLINIC_OR_DEPARTMENT_OTHER): Payer: Self-pay

## 2014-10-26 ENCOUNTER — Emergency Department (HOSPITAL_BASED_OUTPATIENT_CLINIC_OR_DEPARTMENT_OTHER): Payer: Medicare Other

## 2014-10-26 ENCOUNTER — Emergency Department (HOSPITAL_BASED_OUTPATIENT_CLINIC_OR_DEPARTMENT_OTHER)
Admission: EM | Admit: 2014-10-26 | Discharge: 2014-10-26 | Disposition: A | Discharge status: Home / Self Care | Payer: Medicare Other | Attending: Emergency Medicine | Admitting: Emergency Medicine

## 2014-10-26 DIAGNOSIS — F0390 Unspecified dementia without behavioral disturbance: Secondary | ICD-10-CM | POA: Insufficient documentation

## 2014-10-26 DIAGNOSIS — Z7982 Long term (current) use of aspirin: Secondary | ICD-10-CM | POA: Insufficient documentation

## 2014-10-26 DIAGNOSIS — Z79899 Other long term (current) drug therapy: Secondary | ICD-10-CM | POA: Diagnosis not present

## 2014-10-26 DIAGNOSIS — M545 Low back pain: Secondary | ICD-10-CM | POA: Insufficient documentation

## 2014-10-26 DIAGNOSIS — K5900 Constipation, unspecified: Secondary | ICD-10-CM | POA: Insufficient documentation

## 2014-10-26 DIAGNOSIS — R52 Pain, unspecified: Secondary | ICD-10-CM

## 2014-10-26 DIAGNOSIS — I10 Essential (primary) hypertension: Secondary | ICD-10-CM | POA: Insufficient documentation

## 2014-10-26 DIAGNOSIS — Z8619 Personal history of other infectious and parasitic diseases: Secondary | ICD-10-CM | POA: Diagnosis not present

## 2014-10-26 LAB — CBC WITH DIFFERENTIAL/PLATELET
BASOS ABS: 0 10*3/uL (ref 0.0–0.1)
Basophils Relative: 0 % (ref 0–1)
Eosinophils Absolute: 0.1 10*3/uL (ref 0.0–0.7)
Eosinophils Relative: 2 % (ref 0–5)
HCT: 30.1 % — ABNORMAL LOW (ref 39.0–52.0)
Hemoglobin: 9.8 g/dL — ABNORMAL LOW (ref 13.0–17.0)
Lymphocytes Relative: 25 % (ref 12–46)
Lymphs Abs: 1 10*3/uL (ref 0.7–4.0)
MCH: 33.9 pg (ref 26.0–34.0)
MCHC: 32.6 g/dL (ref 30.0–36.0)
MCV: 104.2 fL — ABNORMAL HIGH (ref 78.0–100.0)
Monocytes Absolute: 0.5 10*3/uL (ref 0.1–1.0)
Monocytes Relative: 14 % — ABNORMAL HIGH (ref 3–12)
NEUTROS PCT: 59 % (ref 43–77)
Neutro Abs: 2.2 10*3/uL (ref 1.7–7.7)
Platelets: 103 10*3/uL — ABNORMAL LOW (ref 150–400)
RBC: 2.89 MIL/uL — ABNORMAL LOW (ref 4.22–5.81)
RDW: 14.9 % (ref 11.5–15.5)
SMEAR REVIEW: DECREASED
WBC: 3.8 10*3/uL — ABNORMAL LOW (ref 4.0–10.5)

## 2014-10-26 LAB — I-STAT CHEM 8, ED
BUN: 18 mg/dL (ref 6–20)
Calcium, Ion: 1.24 mmol/L (ref 1.13–1.30)
Chloride: 102 mmol/L (ref 101–111)
Creatinine, Ser: 1.7 mg/dL — ABNORMAL HIGH (ref 0.61–1.24)
Glucose, Bld: 129 mg/dL — ABNORMAL HIGH (ref 65–99)
HEMATOCRIT: 32 % — AB (ref 39.0–52.0)
HEMOGLOBIN: 10.9 g/dL — AB (ref 13.0–17.0)
Potassium: 3.7 mmol/L (ref 3.5–5.1)
Sodium: 145 mmol/L (ref 135–145)
TCO2: 25 mmol/L (ref 0–100)

## 2014-10-26 LAB — URINALYSIS, ROUTINE W REFLEX MICROSCOPIC
Bilirubin Urine: NEGATIVE
Glucose, UA: NEGATIVE mg/dL
Ketones, ur: NEGATIVE mg/dL
LEUKOCYTES UA: NEGATIVE
Nitrite: NEGATIVE
Protein, ur: 30 mg/dL — AB
Specific Gravity, Urine: 1.01 (ref 1.005–1.030)
Urobilinogen, UA: 0.2 mg/dL (ref 0.0–1.0)
pH: 7 (ref 5.0–8.0)

## 2014-10-26 LAB — URINE MICROSCOPIC-ADD ON

## 2014-10-26 LAB — OCCULT BLOOD X 1 CARD TO LAB, STOOL: Fecal Occult Bld: NEGATIVE

## 2014-10-26 MED ORDER — MINERAL OIL RE ENEM
ENEMA | RECTAL | Status: AC
  Filled 2014-10-26: qty 1

## 2014-10-26 MED ORDER — IOHEXOL 300 MG/ML  SOLN
80.0000 mL | Freq: Once | INTRAMUSCULAR | Status: AC | PRN
  Administered 2014-10-26: 80 mL via INTRAVENOUS

## 2014-10-26 MED ORDER — MINERAL OIL RE ENEM
1.0000 | ENEMA | Freq: Once | RECTAL | Status: AC
  Administered 2014-10-26: 1 via RECTAL

## 2014-10-26 MED ORDER — IOHEXOL 300 MG/ML  SOLN
25.0000 mL | Freq: Once | INTRAMUSCULAR | Status: AC | PRN
  Administered 2014-10-26: 25 mL via ORAL

## 2014-10-26 NOTE — ED Notes (Signed)
MD at bedside. 

## 2014-10-26 NOTE — Discharge Instructions (Signed)
Constipation Darryl Ryan can take MiraLAX as directed for constipation as needed. Make sure that he drinks at least six 8 ounce glasses of water or Gatorade daily. His kidney function is slightly worse today than one year ago, and he is more anemic. Call Dr. Felipa Eth to schedule appointment in the office for within the next week. His blood pressure should be rechecked in the office in a week. Today's blood pressure was elevated at 202/92. Dr. Felipa Eth can gain access to today's laboratory values and to the record of today's visit here Constipation is when a person has fewer than three bowel movements a week, has difficulty having a bowel movement, or has stools that are dry, hard, or larger than normal. As people grow older, constipation is more common. If you try to fix constipation with medicines that make you have a bowel movement (laxatives), the problem may get worse. Long-term laxative use may cause the muscles of the colon to become weak. A low-fiber diet, not taking in enough fluids, and taking certain medicines may make constipation worse.  CAUSES   Certain medicines, such as antidepressants, pain medicine, iron supplements, antacids, and water pills.   Certain diseases, such as diabetes, irritable bowel syndrome (IBS), thyroid disease, or depression.   Not drinking enough water.   Not eating enough fiber-rich foods.   Stress or travel.   Lack of physical activity or exercise.   Ignoring the urge to have a bowel movement.   Using laxatives too much.  SIGNS AND SYMPTOMS   Having fewer than three bowel movements a week.   Straining to have a bowel movement.   Having stools that are hard, dry, or larger than normal.   Feeling full or bloated.   Pain in the lower abdomen.   Not feeling relief after having a bowel movement.  DIAGNOSIS  Your health care provider will take a medical history and perform a physical exam. Further testing may be done for severe  constipation. Some tests may include:  A barium enema X-ray to examine your rectum, colon, and, sometimes, your small intestine.   A sigmoidoscopy to examine your lower colon.   A colonoscopy to examine your entire colon. TREATMENT  Treatment will depend on the severity of your constipation and what is causing it. Some dietary treatments include drinking more fluids and eating more fiber-rich foods. Lifestyle treatments may include regular exercise. If these diet and lifestyle recommendations do not help, your health care provider may recommend taking over-the-counter laxative medicines to help you have bowel movements. Prescription medicines may be prescribed if over-the-counter medicines do not work.  HOME CARE INSTRUCTIONS   Eat foods that have a lot of fiber, such as fruits, vegetables, whole grains, and beans.  Limit foods high in fat and processed sugars, such as french fries, hamburgers, cookies, candies, and soda.   A fiber supplement may be added to your diet if you cannot get enough fiber from foods.   Drink enough fluids to keep your urine clear or pale yellow.   Exercise regularly or as directed by your health care provider.   Go to the restroom when you have the urge to go. Do not hold it.   Only take over-the-counter or prescription medicines as directed by your health care provider. Do not take other medicines for constipation without talking to your health care provider first.  [place] IF:   You have bright red blood in your stool.   Your constipation lasts for more  than 4 days or gets worse.   You have abdominal or rectal pain.   You have thin, pencil-like stools.   You have unexplained weight loss. MAKE SURE YOU:   Understand these instructions.  Will watch your condition.  Will get help right away if you are not doing well or get worse. Document Released: 12/09/2003 Document Revised: 03/17/2013 Document Reviewed:  12/22/2012 [facility] Patient Information 2015 [place], [place]. This information is not intended to replace advice given to you by your health care provider. Make sure you discuss any questions you have with your health care provider.

## 2014-10-26 NOTE — ED Notes (Signed)
Pt reports he feels much better after BM. EDP informed

## 2014-10-26 NOTE — ED Notes (Signed)
Patient transported to CT 

## 2014-10-26 NOTE — ED Provider Notes (Signed)
CSN: [id number]     Arrival date & time 10/26/14  3299 History   First MD Initiated Contact with Patient 10/26/14 [phone]     Chief Complaint  Patient presents with  . Constipation     (Consider location/radiation/quality/duration/timing/severity/associated sxs/prior Treatment) HPI Level V caveat dementia. Patient complains of constipation for approximately a week. He reports he His last bowel movement was 2 days ago. He complains of low back pain worse with movement or changing positions and lower abdominal pain also worse with changing positions he denies decrease in appetite. Denies fever nausea or vomiting. He is treated himself with "oils" bowel movement this bowels as well as a suppository. Low back pain and abdominal pain is improved when he remains still. No other associated symptoms Past Medical History  Diagnosis Date  . Hypertension   . Dementia   . Hepatitis B    No past surgical history on file. No family history on file. History  Substance Use Topics  . Smoking status: Never Smoker   . Smokeless tobacco: Not on file  . Alcohol Use: No    Review of Systems  Unable to perform ROS: Dementia  Gastrointestinal: Positive for abdominal pain and constipation.  Musculoskeletal: Positive for back pain.      Allergies  Bee venom  Home Medications   Prior to Admission medications   Medication Sig Start Date End Date Taking? Authorizing Provider  amLODipine (NORVASC) 10 MG tablet Take 10 mg by mouth daily.    Historical Provider, MD  aspirin 325 MG tablet Take 325 mg by mouth daily.    Historical Provider, MD  donepezil (ARICEPT) 10 MG tablet Take 10 mg by mouth at bedtime.    Historical Provider, MD  lisinopril (PRINIVIL,ZESTRIL) 10 MG tablet Take 10 mg by mouth daily.    Historical Provider, MD  memantine (NAMENDA) 10 MG tablet Take 10 mg by mouth 2 (two) times daily.    Historical Provider, MD  metoprolol tartrate (LOPRESSOR) 25 MG tablet Take 25 mg by mouth 2 (two)  times daily.    Historical Provider, MD  Multiple Vitamin (MULTIVITAMIN WITH MINERALS) TABS tablet Take 1 tablet by mouth daily.    Historical Provider, MD  vitamin A 7500 UNIT capsule Take 7,500 Units by mouth daily.    Historical Provider, MD   BP 199/85 mmHg  Pulse 67  Temp(Src) 97.6 F (36.4 C) (Oral)  Resp 18  Ht 5\' 9"  (1.753 m)  Wt 170 lb (77.111 kg)  BMI 25.09 kg/m2  SpO2 96% Physical Exam  Constitutional: He appears well-developed and well-nourished.  HENT:  Head: Normocephalic and atraumatic.  Eyes: Conjunctivae are normal. Pupils are equal, round, and reactive to light.  Neck: Neck supple. No tracheal deviation present. No thyromegaly present.  Cardiovascular: Normal rate and regular rhythm.   No murmur heard. Pulmonary/Chest: Effort normal and breath sounds normal.  Abdominal: Soft. Bowel sounds are normal. He exhibits no distension. There is no tenderness.  Genitourinary: Guaiac negative stool.  Normal tone nontender. HardBrown stool. No gross blood  Musculoskeletal: Normal range of motion. He exhibits no edema or tenderness.  No point tenderness along entire spine. He has pain at lower back when he sits up from a supine position  Neurological: He is alert. Coordination normal.  Skin: Skin is warm and dry. No rash noted.  Psychiatric: He has a normal mood and affect.  Nursing note and vitals reviewed.   ED Course  Procedures (including critical care time) Labs Review Labs Reviewed -  No data to display  Imaging Review No results found.   EKG Interpretation None     12 noon patient received enema with good result. He is resting comfortably and feels much improved after treatment with enema Results for orders placed or performed during the hospital encounter of 10/26/14  CBC with Differential/Platelet  Result Value Ref Range   WBC 3.8 (L) 4.0 - 10.5 K/uL   RBC 2.89 (L) 4.22 - 5.81 MIL/uL   Hemoglobin 9.8 (L) 13.0 - 17.0 g/dL   HCT 30.1 (L) 39.0 - 52.0 %    MCV 104.2 (H) 78.0 - 100.0 fL   MCH 33.9 26.0 - 34.0 pg   MCHC 32.6 30.0 - 36.0 g/dL   RDW 14.9 11.5 - 15.5 %   Platelets 103 (L) 150 - 400 K/uL   Neutrophils Relative % 59 43 - 77 %   Lymphocytes Relative 25 12 - 46 %   Monocytes Relative 14 (H) 3 - 12 %   Eosinophils Relative 2 0 - 5 %   Basophils Relative 0 0 - 1 %   Neutro Abs 2.2 1.7 - 7.7 K/uL   Lymphs Abs 1.0 0.7 - 4.0 K/uL   Monocytes Absolute 0.5 0.1 - 1.0 K/uL   Eosinophils Absolute 0.1 0.0 - 0.7 K/uL   Basophils Absolute 0.0 0.0 - 0.1 K/uL   Smear Review PLATELETS APPEAR DECREASED   Occult blood card to lab, stool Provider will collect  Result Value Ref Range   Fecal Occult Bld NEGATIVE NEGATIVE  Urinalysis, Routine w reflex microscopic (not at [facility])  Result Value Ref Range   Color, Urine YELLOW YELLOW   APPearance CLEAR CLEAR   Specific Gravity, Urine 1.010 1.005 - 1.030   pH 7.0 5.0 - 8.0   Glucose, UA NEGATIVE NEGATIVE mg/dL   Hgb urine dipstick TRACE (A) NEGATIVE   Bilirubin Urine NEGATIVE NEGATIVE   Ketones, ur NEGATIVE NEGATIVE mg/dL   Protein, ur 30 (A) NEGATIVE mg/dL   Urobilinogen, UA 0.2 0.0 - 1.0 mg/dL   Nitrite NEGATIVE NEGATIVE   Leukocytes, UA NEGATIVE NEGATIVE  Urine microscopic-add on  Result Value Ref Range   Squamous Epithelial / LPF FEW (A) RARE   WBC, UA 3-6 <3 WBC/hpf   RBC / HPF 3-6 <3 RBC/hpf   Bacteria, UA FEW (A) RARE  I-stat chem 8, ed  Result Value Ref Range   Sodium 145 135 - 145 mmol/L   Potassium 3.7 3.5 - 5.1 mmol/L   Chloride 102 101 - 111 mmol/L   BUN 18 6 - 20 mg/dL   Creatinine, Ser 1.70 (H) 0.61 - 1.24 mg/dL   Glucose, Bld 129 (H) 65 - 99 mg/dL   Calcium, Ion 1.24 1.13 - 1.30 mmol/L   TCO2 25 0 - 100 mmol/L   Hemoglobin 10.9 (L) 13.0 - 17.0 g/dL   HCT 32.0 (L) 39.0 - 52.0 %   Ct Abdomen Pelvis W Contrast  10/26/2014   CLINICAL DATA:  Constipation and pelvic pain for 1 week. Low back pain.  EXAM: CT ABDOMEN AND PELVIS WITH CONTRAST  TECHNIQUE: Multidetector CT imaging  of the abdomen and pelvis was performed using the standard protocol following bolus administration of intravenous contrast.  CONTRAST:  61mL OMNIPAQUE IOHEXOL 300 MG/ML SOLN, 70mL OMNIPAQUE IOHEXOL 300 MG/ML SOLN  COMPARISON:  One-view abdomen 09/07/2013. CT of the abdomen and pelvis without and with contrast 08/16/2011.  FINDINGS: Mild atelectasis is present at the lung bases bilaterally. The heart size is within normal limits.  Coronary artery calcifications are present. There is no significant pleural or pericardial effusion.  Mild diffuse fatty infiltration of the liver is again noted. Sub cm cysts are unchanged. Multiple calcifications in the spleen are again noted, compatible with prior granulomatous disease.  The stomach, duodenum, and pancreas are within normal limits. The common bile duct and gallbladder are normal. A right-sided adrenal adenoma is stable. The left adrenal gland is normal.  Benign appearing cysts in both kidneys are unchanged. There is increased in size and number of multiple nonobstructing stones. The largest stone on the right is at the lower pole measuring 7.5 mm. The largest left-sided stone is in the midportion of the kidney measuring 8.0 mm. The ureters are within normal limits. Urinary bladder is unremarkable.  Moderate stool is present in the distal rectosigmoid colon. The just rectum is dilated to 7 cm in transverse diameter. There is moderate stool throughout the colon without evidence for obstruction otherwise. The appendix is not discretely visualized and may be surgically absent. The small bowel is within normal limits. No significant adenopathy or free fluid is present.  Bone windows demonstrate multilevel degenerative change. There is chronic loss of disc height and a vacuum disc at L2-3, L4-5, and L5-S1. Multilevel facet degenerative changes are present. No focal lytic or blastic lesions are noted.  IMPRESSION: 1. Moderate stool at the rectum may be impacted. 2. Extensive  stool throughout the colon suggesting constipation. 3. Chronic mild fatty infiltration of the liver and multiple benign appearing sub cm cyst. 4. Granulomatous changes in the spleen. 5. Increase in size and number of multiple nonobstructing stones in both kidneys. 6. Stable cystic changes in both kidneys. 7. Multilevel degenerative changes in the lumbar spine. 8. Atherosclerotic changes including coronary artery disease.   Electronically Signed   By: San Morelle M.D.   On: 10/26/2014 10:46    MDM  Patient has chronic macrocytic anemia Final diagnoses:  None    Plan  Use Miralax as directed for constipation ; f/u with Dr. Romero Belling. Encourage oral hydration.tylwenol prn back pain D/c'd with wife     Orlie Dakin, MD 10/26/14 [phone]

## 2014-10-26 NOTE — ED Notes (Signed)
Pt returned from CT, no change in condition.

## 2014-10-26 NOTE — ED Notes (Addendum)
Pt reports constipation, pelvic and back pain x 1 week.  Pain is progressively worsening.  Last normal BM 3-4 days ago and has to strain when going to the restroom. Suppository used today without relief.

## 2014-10-26 NOTE — ED Notes (Signed)
Family at bedside and informed of plan of care.

## 2014-11-24 ENCOUNTER — Ambulatory Visit
Admission: RE | Admit: 2014-11-24 | Discharge: 2014-11-24 | Disposition: A | Discharge status: Home / Self Care | Payer: Medicare Other | Source: Ambulatory Visit | Attending: Geriatric Medicine | Admitting: Geriatric Medicine

## 2014-11-24 ENCOUNTER — Other Ambulatory Visit: Payer: Self-pay | Admitting: Geriatric Medicine

## 2014-11-24 DIAGNOSIS — M545 Low back pain: Secondary | ICD-10-CM

## 2014-11-24 DIAGNOSIS — R4702 Dysphasia: Secondary | ICD-10-CM

## 2014-11-26 ENCOUNTER — Ambulatory Visit
Admission: RE | Admit: 2014-11-26 | Discharge: 2014-11-26 | Disposition: A | Discharge status: Home / Self Care | Payer: Medicare Other | Source: Ambulatory Visit | Attending: Geriatric Medicine | Admitting: Geriatric Medicine

## 2014-11-26 DIAGNOSIS — R4702 Dysphasia: Secondary | ICD-10-CM

## 2014-12-07 ENCOUNTER — Encounter (HOSPITAL_BASED_OUTPATIENT_CLINIC_OR_DEPARTMENT_OTHER): Payer: Self-pay | Admitting: Emergency Medicine

## 2014-12-07 ENCOUNTER — Inpatient Hospital Stay (HOSPITAL_BASED_OUTPATIENT_CLINIC_OR_DEPARTMENT_OTHER)
Admission: EM | Admit: 2014-12-07 | Discharge: 2014-12-14 | DRG: 682 | Disposition: A | Discharge status: Home Health | Payer: Medicare Other | Attending: Internal Medicine | Admitting: Internal Medicine

## 2014-12-07 DIAGNOSIS — T508X5A Adverse effect of diagnostic agents, initial encounter: Secondary | ICD-10-CM | POA: Diagnosis present

## 2014-12-07 DIAGNOSIS — M545 Low back pain: Secondary | ICD-10-CM | POA: Diagnosis present

## 2014-12-07 DIAGNOSIS — R040 Epistaxis: Secondary | ICD-10-CM | POA: Diagnosis present

## 2014-12-07 DIAGNOSIS — N3944 Nocturnal enuresis: Secondary | ICD-10-CM | POA: Diagnosis not present

## 2014-12-07 DIAGNOSIS — M4856XA Collapsed vertebra, not elsewhere classified, lumbar region, initial encounter for fracture: Secondary | ICD-10-CM | POA: Diagnosis present

## 2014-12-07 DIAGNOSIS — N141 Nephropathy induced by other drugs, medicaments and biological substances: Secondary | ICD-10-CM | POA: Diagnosis present

## 2014-12-07 DIAGNOSIS — Z79899 Other long term (current) drug therapy: Secondary | ICD-10-CM | POA: Diagnosis not present

## 2014-12-07 DIAGNOSIS — F0390 Unspecified dementia without behavioral disturbance: Secondary | ICD-10-CM | POA: Diagnosis present

## 2014-12-07 DIAGNOSIS — N179 Acute kidney failure, unspecified: Secondary | ICD-10-CM | POA: Diagnosis present

## 2014-12-07 DIAGNOSIS — Z885 Allergy status to narcotic agent status: Secondary | ICD-10-CM | POA: Diagnosis not present

## 2014-12-07 DIAGNOSIS — D472 Monoclonal gammopathy: Secondary | ICD-10-CM | POA: Diagnosis present

## 2014-12-07 DIAGNOSIS — D61818 Other pancytopenia: Secondary | ICD-10-CM | POA: Diagnosis present

## 2014-12-07 DIAGNOSIS — M549 Dorsalgia, unspecified: Secondary | ICD-10-CM

## 2014-12-07 DIAGNOSIS — I129 Hypertensive chronic kidney disease with stage 1 through stage 4 chronic kidney disease, or unspecified chronic kidney disease: Principal | ICD-10-CM | POA: Diagnosis present

## 2014-12-07 DIAGNOSIS — N184 Chronic kidney disease, stage 4 (severe): Secondary | ICD-10-CM | POA: Diagnosis present

## 2014-12-07 DIAGNOSIS — E860 Dehydration: Secondary | ICD-10-CM | POA: Diagnosis present

## 2014-12-07 DIAGNOSIS — Z87442 Personal history of urinary calculi: Secondary | ICD-10-CM | POA: Diagnosis not present

## 2014-12-07 DIAGNOSIS — N170 Acute kidney failure with tubular necrosis: Secondary | ICD-10-CM | POA: Diagnosis present

## 2014-12-07 DIAGNOSIS — I2510 Atherosclerotic heart disease of native coronary artery without angina pectoris: Secondary | ICD-10-CM | POA: Diagnosis present

## 2014-12-07 DIAGNOSIS — C9000 Multiple myeloma not having achieved remission: Secondary | ICD-10-CM | POA: Diagnosis present

## 2014-12-07 DIAGNOSIS — M4854XA Collapsed vertebra, not elsewhere classified, thoracic region, initial encounter for fracture: Secondary | ICD-10-CM | POA: Diagnosis present

## 2014-12-07 DIAGNOSIS — I160 Hypertensive urgency: Secondary | ICD-10-CM

## 2014-12-07 DIAGNOSIS — K760 Fatty (change of) liver, not elsewhere classified: Secondary | ICD-10-CM | POA: Diagnosis present

## 2014-12-07 DIAGNOSIS — Z91030 Bee allergy status: Secondary | ICD-10-CM | POA: Diagnosis not present

## 2014-12-07 DIAGNOSIS — IMO0002 Reserved for concepts with insufficient information to code with codable children: Secondary | ICD-10-CM

## 2014-12-07 DIAGNOSIS — K5900 Constipation, unspecified: Secondary | ICD-10-CM | POA: Diagnosis present

## 2014-12-07 DIAGNOSIS — N189 Chronic kidney disease, unspecified: Secondary | ICD-10-CM

## 2014-12-07 DIAGNOSIS — I10 Essential (primary) hypertension: Secondary | ICD-10-CM | POA: Diagnosis not present

## 2014-12-07 DIAGNOSIS — E876 Hypokalemia: Secondary | ICD-10-CM | POA: Diagnosis present

## 2014-12-07 DIAGNOSIS — T148 Other injury of unspecified body region: Secondary | ICD-10-CM | POA: Diagnosis not present

## 2014-12-07 DIAGNOSIS — G8929 Other chronic pain: Secondary | ICD-10-CM | POA: Diagnosis present

## 2014-12-07 HISTORY — DX: Dysphasia: R47.02

## 2014-12-07 HISTORY — DX: Chronic kidney disease, unspecified: N18.9

## 2014-12-07 LAB — URINALYSIS, ROUTINE W REFLEX MICROSCOPIC
Bilirubin Urine: NEGATIVE
GLUCOSE, UA: NEGATIVE mg/dL
KETONES UR: NEGATIVE mg/dL
LEUKOCYTES UA: NEGATIVE
Nitrite: NEGATIVE
PH: 6.5 (ref 5.0–8.0)
PROTEIN: 30 mg/dL — AB
Specific Gravity, Urine: 1.008 (ref 1.005–1.030)
Urobilinogen, UA: 0.2 mg/dL (ref 0.0–1.0)

## 2014-12-07 LAB — BASIC METABOLIC PANEL
ANION GAP: 16 — AB (ref 5–15)
BUN: 44 mg/dL — AB (ref 6–20)
CALCIUM: 9.3 mg/dL (ref 8.9–10.3)
CO2: 28 mmol/L (ref 22–32)
Chloride: 93 mmol/L — ABNORMAL LOW (ref 101–111)
Creatinine, Ser: 5.39 mg/dL — ABNORMAL HIGH (ref 0.61–1.24)
GFR calc Af Amer: 10 mL/min — ABNORMAL LOW (ref >60)
GFR calc non Af Amer: 8 mL/min — ABNORMAL LOW (ref >60)
GLUCOSE: 121 mg/dL — AB (ref 65–99)
Potassium: 3.5 mmol/L (ref 3.5–5.1)
Sodium: 137 mmol/L (ref 135–145)

## 2014-12-07 LAB — URINE MICROSCOPIC-ADD ON

## 2014-12-07 MED ORDER — SODIUM CHLORIDE 0.9 % IV SOLN: INTRAVENOUS | Status: DC

## 2014-12-07 MED ORDER — SODIUM CHLORIDE 0.9 % IV BOLUS (SEPSIS)
1000.0000 mL | Freq: Once | INTRAVENOUS | Status: AC
  Administered 2014-12-07: 1000 mL via INTRAVENOUS

## 2014-12-07 MED ORDER — AMLODIPINE BESYLATE 5 MG PO TABS
5.0000 mg | ORAL_TABLET | Freq: Once | ORAL | Status: AC
  Administered 2014-12-07: 5 mg via ORAL
  Filled 2014-12-07: qty 1

## 2014-12-07 MED ORDER — HYDRALAZINE HCL 20 MG/ML IJ SOLN
5.0000 mg | Freq: Once | INTRAMUSCULAR | Status: AC
  Administered 2014-12-07: 5 mg via INTRAVENOUS
  Filled 2014-12-07: qty 1

## 2014-12-07 NOTE — ED Notes (Signed)
Nose bleed this afternoon, mild hypertension.  Bleeding controlled.  Pt also c/o lower back pain.

## 2014-12-07 NOTE — ED Provider Notes (Addendum)
CSN: [id number]     Arrival date & time 12/07/14  1754 History   First MD Initiated Contact with Patient 12/07/14 1757     Chief Complaint  Patient presents with  . Epistaxis     (Consider location/radiation/quality/duration/timing/severity/associated sxs/prior Treatment) Patient is a 81 y.o. male presenting with nosebleeds. The history is provided by the patient.  Epistaxis Location:  R nare Severity:  Moderate Duration:  10 minutes Timing:  Constant Progression:  Resolved Chronicity:  New Context: hypertension   Context: not anticoagulants, not bleeding disorder, not nose picking and not recent infection   Context comment:  Over last 3 days blood pressure had been much higher than his baseline.  Running in the 409'W systolic when it was in the 140's.  States 2 weeks ago stopped lisinopril due to kidneys Relieved by:  Applying pressure Worsened by:  Nothing tried Ineffective treatments:  None tried Associated symptoms: no blood in oropharynx, no cough, no dizziness, no facial pain, no fever, no headaches, no sinus pain, no sore throat and no syncope   Associated symptoms comment:  No chest pain, SOB.   Risk factors: no frequent nosebleeds, no intranasal steroids and no recent nasal surgery   Risk factors comment:  No prior nose bleeds   Past Medical History  Diagnosis Date  . Hypertension   . Dementia   . Hepatitis B   . Dysphasia   . Chronic kidney disease    History reviewed. No pertinent past surgical history. No family history on file. Social History  Substance Use Topics  . Smoking status: Never Smoker   . Smokeless tobacco: None  . Alcohol Use: No    Review of Systems  Constitutional: Negative for fever.  HENT: Positive for nosebleeds. Negative for sore throat.   Respiratory: Negative for cough.   Cardiovascular: Negative for syncope.  Musculoskeletal: Positive for back pain.       Ongoing back pain from compression fractures about a month ago. They feel  pretty good while he is lying still but are worse when he attempts to move around  Neurological: Negative for dizziness and headaches.      Allergies  Bee venom and Tramadol  Home Medications   Prior to Admission medications   Medication Sig Start Date End Date Taking? Authorizing Provider  doxazosin (CARDURA) 2 MG tablet Take 2 mg by mouth daily.   Yes Historical Provider, MD  amLODipine (NORVASC) 10 MG tablet Take 10 mg by mouth daily.    Historical Provider, MD  aspirin 325 MG tablet Take 325 mg by mouth daily.    Historical Provider, MD  donepezil (ARICEPT) 10 MG tablet Take 10 mg by mouth at bedtime.    Historical Provider, MD  lisinopril (PRINIVIL,ZESTRIL) 10 MG tablet Take 10 mg by mouth daily.    Historical Provider, MD  memantine (NAMENDA) 10 MG tablet Take 10 mg by mouth 2 (two) times daily.    Historical Provider, MD  metoprolol tartrate (LOPRESSOR) 25 MG tablet Take 25 mg by mouth 2 (two) times daily.    Historical Provider, MD  Multiple Vitamin (MULTIVITAMIN WITH MINERALS) TABS tablet Take 1 tablet by mouth daily.    Historical Provider, MD  vitamin A 7500 UNIT capsule Take 7,500 Units by mouth daily.    Historical Provider, MD   BP 195/88 mmHg  Pulse 73  Resp 18  Ht _0  (1.702 m)  Wt 160 lb (72.576 kg)  BMI 25.05 kg/m2  SpO2 95% Physical Exam  Constitutional:  He is oriented to person, place, and time. He appears well-developed and well-nourished. No distress.  HENT:  Head: Normocephalic and atraumatic.  Nose: Epistaxis is observed.    Mouth/Throat: Oropharynx is clear and moist.  Eyes: Conjunctivae and EOM are normal. Pupils are equal, round, and reactive to light.  Neck: Normal range of motion. Neck supple.  Cardiovascular: Normal rate, regular rhythm and intact distal pulses.   No murmur heard. Pulmonary/Chest: Effort normal and breath sounds normal. No respiratory distress. He has no wheezes. He has no rales.  Abdominal: Soft. Bowel sounds are normal. He  exhibits no distension and no mass. There is no tenderness. There is no rebound and no guarding.  Musculoskeletal: Normal range of motion. He exhibits no edema or tenderness.  Neurological: He is alert and oriented to person, place, and time.  Skin: Skin is warm and dry. No rash noted. No erythema.  Psychiatric: He has a normal mood and affect. His behavior is normal.  Nursing note and vitals reviewed.   ED Course  Procedures (including critical care time) Labs Review Labs Reviewed  BASIC METABOLIC PANEL - Abnormal; Notable for the following:    Chloride 93 (*)    Glucose, Bld 121 (*)    BUN 44 (*)    Creatinine, Ser 5.39 (*)    GFR calc non Af Amer 8 (*)    GFR calc Af Amer 10 (*)    Anion gap 16 (*)    All other components within normal limits  URINALYSIS, ROUTINE W REFLEX MICROSCOPIC (NOT AT [facility]) - Abnormal; Notable for the following:    Hgb urine dipstick TRACE (*)    Protein, ur 30 (*)    All other components within normal limits  URINE MICROSCOPIC-ADD ON  CBC WITH DIFFERENTIAL/PLATELET    Imaging Review No results found. I have personally reviewed and evaluated these images and lab results as part of my medical decision-making.   EKG Interpretation None      MDM   Final diagnoses:  Acute renal failure, unspecified acute renal failure type  Multiple myeloma    Patient presenting today with complaint of right-sided epistaxis and persistent high blood pressure for the last 3 days. Patient in the last 1 month has been diagnosed with compression fractures and his wife states that they've had ongoing issues with back pain but he stooling okay until his nose started bleeding today. He has never had a nosebleed in the past. She states his blood pressure at least the last 3 days has been in the 220U systolic. Patient recently stopped his lisinopril 2 weeks ago because of elevated creatinine. No other medications were adjusted and he has an appointment to see Dr. Felipa Eth  on Friday. He denies any chest pain or shortness of breath. No headaches. He suffers from chronic constipation and has a poor appetite but denies excessive salt intake. Blood pressure here on repetitive checking is 195 over 80s. His nosebleed has stopped spontaneously and no evidence of acute bleeding at this time.  We'll recheck creatinine to ensure is not worsened from his baseline of 2.3. We'll increase amlodipine to 10 mg per day until he sees Dr. Felipa Eth.  7:11 PM Patient's creatinine today has now doubled from last week. He went from 2.5-5.39 putting him in acute renal failure. No urinary retention on bedside u/s.  After findings discussed with the patient and his wife the wife brings me outside the room and states that he had recently been diagnosed with multiple myeloma  today which is most likely the cause of his acute renal failure. Spoke with Dr. Inda Merlin with oncology who states the patient will be seen in the morning. Requested the patient could've [facility] for further evaluation and admission.  Blanchie Dessert, MD 12/07/14 1912  Blanchie Dessert, MD 12/07/14 2114

## 2014-12-07 NOTE — Progress Notes (Addendum)
81 y.o. male with a history of hypertension, recent diagnosis of multiple myeloma, not yet started on treatment, presented with epistaxis to medicine to [facility], also diagnosed with acute on chronic renal failure, recently asked to discontinue lisinopril, also presented with hypertensive urgency. Dr. Maryan Rued spoke with Dr. Julien Nordmann, oncologist on call who would like the patient to be transferred to [facility], Requested Dr. Maryan Rued to stabilize blood pressure prior to transfer Accepted to telemetry

## 2014-12-08 ENCOUNTER — Other Ambulatory Visit: Payer: Self-pay | Admitting: Hematology and Oncology

## 2014-12-08 ENCOUNTER — Encounter: Payer: Self-pay | Admitting: Hematology and Oncology

## 2014-12-08 ENCOUNTER — Inpatient Hospital Stay (HOSPITAL_COMMUNITY): Payer: Medicare Other

## 2014-12-08 ENCOUNTER — Encounter (HOSPITAL_COMMUNITY): Payer: Self-pay

## 2014-12-08 DIAGNOSIS — D472 Monoclonal gammopathy: Secondary | ICD-10-CM | POA: Diagnosis present

## 2014-12-08 DIAGNOSIS — F0390 Unspecified dementia without behavioral disturbance: Secondary | ICD-10-CM | POA: Diagnosis present

## 2014-12-08 DIAGNOSIS — R040 Epistaxis: Secondary | ICD-10-CM

## 2014-12-08 DIAGNOSIS — I10 Essential (primary) hypertension: Secondary | ICD-10-CM

## 2014-12-08 DIAGNOSIS — C9000 Multiple myeloma not having achieved remission: Secondary | ICD-10-CM | POA: Diagnosis present

## 2014-12-08 DIAGNOSIS — N179 Acute kidney failure, unspecified: Secondary | ICD-10-CM

## 2014-12-08 DIAGNOSIS — D61818 Other pancytopenia: Secondary | ICD-10-CM

## 2014-12-08 LAB — BASIC METABOLIC PANEL
ANION GAP: 13 (ref 5–15)
BUN: 41 mg/dL — ABNORMAL HIGH (ref 6–20)
CHLORIDE: 100 mmol/L — AB (ref 101–111)
CO2: 28 mmol/L (ref 22–32)
Calcium: 8.9 mg/dL (ref 8.9–10.3)
Creatinine, Ser: 4.9 mg/dL — ABNORMAL HIGH (ref 0.61–1.24)
GFR calc Af Amer: 11 mL/min — ABNORMAL LOW (ref >60)
GFR, EST NON AFRICAN AMERICAN: 10 mL/min — AB (ref >60)
GLUCOSE: 120 mg/dL — AB (ref 65–99)
POTASSIUM: 3.3 mmol/L — AB (ref 3.5–5.1)
Sodium: 141 mmol/L (ref 135–145)

## 2014-12-08 LAB — CBC WITH DIFFERENTIAL/PLATELET
Basophils Absolute: 0 10*3/uL (ref 0.0–0.1)
Basophils Relative: 0 % (ref 0–1)
EOS ABS: 0.1 10*3/uL (ref 0.0–0.7)
EOS PCT: 3 % (ref 0–5)
HCT: 25.7 % — ABNORMAL LOW (ref 39.0–52.0)
Hemoglobin: 8.4 g/dL — ABNORMAL LOW (ref 13.0–17.0)
LYMPHS ABS: 1.2 10*3/uL (ref 0.7–4.0)
LYMPHS PCT: 35 % (ref 12–46)
MCH: 33.9 pg (ref 26.0–34.0)
MCHC: 32.7 g/dL (ref 30.0–36.0)
MCV: 103.6 fL — AB (ref 78.0–100.0)
MONO ABS: 0.5 10*3/uL (ref 0.1–1.0)
MONOS PCT: 14 % — AB (ref 3–12)
Neutro Abs: 1.7 10*3/uL (ref 1.7–7.7)
Neutrophils Relative %: 48 % (ref 43–77)
PLATELETS: 115 10*3/uL — AB (ref 150–400)
RBC: 2.48 MIL/uL — AB (ref 4.22–5.81)
RDW: 14.8 % (ref 11.5–15.5)
WBC: 3.5 10*3/uL — AB (ref 4.0–10.5)

## 2014-12-08 LAB — CBC
HEMATOCRIT: 25.1 % — AB (ref 39.0–52.0)
HEMOGLOBIN: 8.4 g/dL — AB (ref 13.0–17.0)
MCH: 34 pg (ref 26.0–34.0)
MCHC: 33.5 g/dL (ref 30.0–36.0)
MCV: 101.6 fL — ABNORMAL HIGH (ref 78.0–100.0)
Platelets: 117 10*3/uL — ABNORMAL LOW (ref 150–400)
RBC: 2.47 MIL/uL — ABNORMAL LOW (ref 4.22–5.81)
RDW: 15.6 % — ABNORMAL HIGH (ref 11.5–15.5)
WBC: 3.8 10*3/uL — ABNORMAL LOW (ref 4.0–10.5)

## 2014-12-08 LAB — HEPATIC FUNCTION PANEL
ALBUMIN: 2.3 g/dL — AB (ref 3.5–5.0)
ALK PHOS: 67 U/L (ref 38–126)
ALT: 9 U/L — ABNORMAL LOW (ref 17–63)
AST: 16 U/L (ref 15–41)
BILIRUBIN TOTAL: 0.5 mg/dL (ref 0.3–1.2)
Bilirubin, Direct: <0.1 mg/dL — ABNORMAL LOW (ref 0.1–0.5)
Total Protein: 7.8 g/dL (ref 6.5–8.1)

## 2014-12-08 LAB — VITAMIN B12: VITAMIN B 12: 232 pg/mL (ref 180–914)

## 2014-12-08 LAB — PROTIME-INR
INR: 1.17 (ref 0.00–1.49)
PROTHROMBIN TIME: 15 s (ref 11.6–15.2)

## 2014-12-08 LAB — APTT: aPTT: 34 seconds (ref 24–37)

## 2014-12-08 LAB — FERRITIN: Ferritin: 250 ng/mL (ref 24–336)

## 2014-12-08 LAB — IRON AND TIBC
Iron: 49 ug/dL (ref 45–182)
SATURATION RATIOS: 32 % (ref 17.9–39.5)
TIBC: 154 ug/dL — ABNORMAL LOW (ref 250–450)
UIBC: 105 ug/dL

## 2014-12-08 MED ORDER — POTASSIUM CHLORIDE CRYS ER 20 MEQ PO TBCR
40.0000 meq | EXTENDED_RELEASE_TABLET | Freq: Once | ORAL | Status: AC
  Administered 2014-12-08: 40 meq via ORAL
  Filled 2014-12-08: qty 2

## 2014-12-08 MED ORDER — OXYCODONE HCL 5 MG PO TABS
5.0000 mg | ORAL_TABLET | ORAL | Status: DC | PRN
  Administered 2014-12-08 (×2): 5 mg via ORAL
  Filled 2014-12-08 (×2): qty 1

## 2014-12-08 MED ORDER — ONDANSETRON HCL 4 MG/2ML IJ SOLN: 4.0000 mg | Freq: Four times a day (QID) | INTRAMUSCULAR | Status: DC | PRN

## 2014-12-08 MED ORDER — DOXAZOSIN MESYLATE 2 MG PO TABS
2.0000 mg | ORAL_TABLET | Freq: Every day | ORAL | Status: DC
  Administered 2014-12-08 – 2014-12-14 (×7): 2 mg via ORAL
  Filled 2014-12-08 (×7): qty 1

## 2014-12-08 MED ORDER — SODIUM CHLORIDE 0.9 % IJ SOLN
3.0000 mL | Freq: Two times a day (BID) | INTRAMUSCULAR | Status: DC
  Administered 2014-12-08 – 2014-12-14 (×9): 3 mL via INTRAVENOUS

## 2014-12-08 MED ORDER — OMEGA-3-ACID ETHYL ESTERS 1 G PO CAPS
1.0000 g | ORAL_CAPSULE | Freq: Every day | ORAL | Status: DC
  Administered 2014-12-08 – 2014-12-14 (×7): 1 g via ORAL
  Filled 2014-12-08 (×7): qty 1

## 2014-12-08 MED ORDER — ONDANSETRON HCL 4 MG PO TABS: 4.0000 mg | ORAL_TABLET | Freq: Four times a day (QID) | ORAL | Status: DC | PRN

## 2014-12-08 MED ORDER — ALUM & MAG HYDROXIDE-SIMETH 200-200-20 MG/5ML PO SUSP: 30.0000 mL | Freq: Four times a day (QID) | ORAL | Status: DC | PRN

## 2014-12-08 MED ORDER — SODIUM CHLORIDE 0.9 % IV SOLN
INTRAVENOUS | Status: DC
Start: 2014-12-08 — End: 2014-12-10
  Administered 2014-12-08 – 2014-12-09 (×4): via INTRAVENOUS

## 2014-12-08 MED ORDER — ADULT MULTIVITAMIN W/MINERALS CH
1.0000 | ORAL_TABLET | Freq: Every day | ORAL | Status: DC
  Administered 2014-12-08 – 2014-12-14 (×7): 1 via ORAL
  Filled 2014-12-08 (×7): qty 1

## 2014-12-08 MED ORDER — HYDROMORPHONE HCL 1 MG/ML IJ SOLN
0.5000 mg | INTRAMUSCULAR | Status: DC | PRN
  Filled 2014-12-08 (×2): qty 1

## 2014-12-08 MED ORDER — AMLODIPINE BESYLATE 10 MG PO TABS
10.0000 mg | ORAL_TABLET | Freq: Every day | ORAL | Status: DC
  Administered 2014-12-08 – 2014-12-14 (×7): 10 mg via ORAL
  Filled 2014-12-08 (×7): qty 1

## 2014-12-08 MED ORDER — HYDRALAZINE HCL 20 MG/ML IJ SOLN
10.0000 mg | Freq: Four times a day (QID) | INTRAMUSCULAR | Status: DC | PRN
  Administered 2014-12-10 – 2014-12-14 (×2): 10 mg via INTRAVENOUS
  Filled 2014-12-08 (×2): qty 1

## 2014-12-08 MED ORDER — METOPROLOL TARTRATE 25 MG PO TABS
25.0000 mg | ORAL_TABLET | Freq: Two times a day (BID) | ORAL | Status: DC
  Administered 2014-12-08 – 2014-12-14 (×13): 25 mg via ORAL
  Filled 2014-12-08 (×14): qty 1

## 2014-12-08 MED ORDER — INFLUENZA VAC SPLIT QUAD 0.5 ML IM SUSY
0.5000 mL | PREFILLED_SYRINGE | INTRAMUSCULAR | Status: AC
  Administered 2014-12-10: 0.5 mL via INTRAMUSCULAR
  Filled 2014-12-08 (×2): qty 0.5

## 2014-12-08 MED ORDER — DONEPEZIL HCL 10 MG PO TABS
10.0000 mg | ORAL_TABLET | Freq: Every day | ORAL | Status: DC
  Administered 2014-12-08 – 2014-12-13 (×6): 10 mg via ORAL
  Filled 2014-12-08 (×8): qty 1

## 2014-12-08 MED ORDER — MEMANTINE HCL 10 MG PO TABS
10.0000 mg | ORAL_TABLET | Freq: Two times a day (BID) | ORAL | Status: DC
  Administered 2014-12-08 – 2014-12-14 (×13): 10 mg via ORAL
  Filled 2014-12-08 (×14): qty 1

## 2014-12-08 MED ORDER — ACETAMINOPHEN 650 MG RE SUPP: 650.0000 mg | Freq: Four times a day (QID) | RECTAL | Status: DC | PRN

## 2014-12-08 MED ORDER — ACETAMINOPHEN 325 MG PO TABS
650.0000 mg | ORAL_TABLET | Freq: Four times a day (QID) | ORAL | Status: DC | PRN
  Administered 2014-12-08 – 2014-12-12 (×6): 650 mg via ORAL
  Filled 2014-12-08 (×7): qty 2

## 2014-12-08 NOTE — Progress Notes (Signed)
Pt admitted after midnight. Please see earlier admission note by Dr. Arnoldo Morale. Pt admitted for evaluation of AKI, MM. Oncologist consulted and we appreciate assistance. Follow up on blood work and renal US.  [name], MD  [facility] Pager [phone]  If 7PM-7AM, please contact night-coverage [email or web address] Password [email or web address]

## 2014-12-08 NOTE — H&P (Signed)
[facility] Admission History and Physical       SHANTI EICHEL IHW:[id number] DOB: 1923/09/20 DOA: 12/07/2014  Referring physician: EDP PCP: Mathews Argyle, MD  Specialists:   Chief Complaint: Nosebleed  HPI: Darryl Ryan is a 81 y.o. male with a history of HTN, CKD, Pancytopenia/Recent Diagnosis of Multiple Myeloma and Dementia who was seen at the [facility] ED due to Epistaxis from the Right Nare.  In the ED he was evaluated and found to have an elevated Blood Pressure of 194/85.   He was also found to have an elevated BUN/Cr of 44/5.39.  Previously his baseline cr had been 2.5.  His Lisinopril Rx has been on hold for the past 2 days due to his lab values.   He is unable to give a history.   His Wife assisted with the history while in the [facility] ED.   He was transferred to  [facility] for further evaluation and treatment.     Review of Systems: Unable to Obtain from the Patient   Past Medical History  Diagnosis Date  . Hypertension   . Dementia   . Hepatitis B   . Dysphasia   . Chronic kidney disease      History reviewed. No pertinent past surgical history.    Prior to Admission medications   Medication Sig Start Date End Date Taking? Authorizing Provider  amLODipine (NORVASC) 10 MG tablet Take 10 mg by mouth daily.   Yes Historical Provider, MD  Ascorbic Acid (VITAMIN C) 1000 MG tablet Take 1,000 mg by mouth daily.   Yes Historical Provider, MD  aspirin 325 MG tablet Take 325 mg by mouth daily.   Yes Historical Provider, MD  donepezil (ARICEPT) 10 MG tablet Take 10 mg by mouth at bedtime.   Yes Historical Provider, MD  doxazosin (CARDURA) 2 MG tablet Take 2 mg by mouth daily.   Yes Historical Provider, MD  memantine (NAMENDA) 10 MG tablet Take 10 mg by mouth 2 (two) times daily.   Yes Historical Provider, MD  metoprolol tartrate (LOPRESSOR) 25 MG tablet Take 25 mg by mouth 2 (two) times daily.   Yes Historical Provider, MD  Multiple Vitamin  (MULTIVITAMIN WITH MINERALS) TABS tablet Take 1 tablet by mouth daily.   Yes Historical Provider, MD  Omega-3 Fatty Acids (FISH OIL) 1000 MG CAPS Take 1 capsule by mouth daily.   Yes Historical Provider, MD     Allergies  Allergen Reactions  . Bee Venom Anaphylaxis  . Tramadol Hives    Social History:  reports that he has never smoked. He does not have any smokeless tobacco history on file. He reports that he does not drink alcohol. His drug history is not on file.    History reviewed. No pertinent family history.     Physical Exam:  GEN:  Pleasant Elderly Well Nourished and Well Developed  81 y.o. African American male examined and in no acute distress; cooperative with exam Filed Vitals:   12/07/14 2315 12/07/14 2330 12/07/14 2345 12/08/14 0114  BP: 181/80 176/84 178/77 194/85  Pulse: 80 72 72 70  Temp:    97.8 F (36.6 C)  TempSrc:    Oral  Resp:    16  Height:    5' 8"  (1.727 m)  Weight:    71.3 kg (157 lb 3 oz)  SpO2: 96% 94% 92% 96%   Blood pressure 194/85, pulse 70, temperature 97.8 F (36.6 C), temperature source Oral, resp. rate 16, height 5' 8"  (  1.727 m), weight 71.3 kg (157 lb 3 oz), SpO2 96 %. PSYCH: He is alert and oriented x 2; does not appear anxious does not appear depressed; affect is normal HEENT: Normocephalic and Atraumatic, Mucous membranes pink; PERRLA; EOM intact; Fundi:  Benign;  No scleral icterus, Nares: Patent, Oropharynx: Clear,     Neck:  FROM, No Cervical Lymphadenopathy nor Thyromegaly or Carotid Bruit; No JVD; Breasts:: Not examined CHEST WALL: No tenderness CHEST: Normal respiration, clear to auscultation bilaterally HEART: Regular rate and rhythm; no murmurs rubs or gallops BACK: No kyphosis or scoliosis; No CVA tenderness ABDOMEN: Positive Bowel Sounds, Soft Non-Tender, No Rebound or Guarding; No Masses, No Organomegaly Rectal Exam: Not done EXTREMITIES: No Cyanosis, Clubbing, or Edema; No Ulcerations. Genitalia: not examined PULSES: 2+  and symmetric SKIN: Normal hydration no rash or ulceration CNS:  Alert and Oriented x 2, No Focal Deficits Vascular: pulses palpable throughout    Labs on Admission:  Basic Metabolic Panel:  Recent Labs Lab 12/07/14 1840  NA 137  K 3.5  CL 93*  CO2 28  GLUCOSE 121*  BUN 44*  CREATININE 5.39*  CALCIUM 9.3   Liver Function Tests: No results for input(s): AST, ALT, ALKPHOS, BILITOT, PROT, ALBUMIN in the last 168 hours. No results for input(s): LIPASE, AMYLASE in the last 168 hours. No results for input(s): AMMONIA in the last 168 hours. CBC:  Recent Labs Lab 12/07/14 1840  WBC 3.5*  NEUTROABS 1.7  HGB 8.4*  HCT 25.7*  MCV 103.6*  PLT 115*   Cardiac Enzymes: No results for input(s): CKTOTAL, CKMB, CKMBINDEX, TROPONINI in the last 168 hours.  BNP (last 3 results) No results for input(s): BNP in the last 8760 hours.  ProBNP (last 3 results) No results for input(s): PROBNP in the last 8760 hours.  CBG: No results for input(s): GLUCAP in the last 168 hours.  Radiological Exams on Admission: No results found.     Assessment/Plan:     81 y.o. male with  Principal Problem:   1.     AKI (acute kidney injury)   IVFs   Monitor BUN/Cr   Active Problems:   2.    Multiple myeloma/Other pancytopenia- Not clear when Multiple Myeloma was diagnosed per Documentation in [place]   Notify Oncology     3.    Essential hypertension   PRN IV Hydralazine for SBP > 170   Continue Lopressor , Cardura, and Amlodipine Rx    As BP tolerates     4.    Dementia   Continue Aricept and Namenda Rx      6.    DVT Prophylaxis   SCDs    Code Status:     FULL CODE        Family Communication:    No Family Present    Disposition Plan:    Inpatient Status   Expected to return to his home in 2-3 days  Time spent:  [facility] Pager [phone]   If [place] Please Contact the Day Rounding Team MD for [facility]  If 7PM-7AM,  Please Contact Night-Floor Coverage  [email or web address] Password [email or web address] 12/08/2014, 3:17 AM     ADDENDUM:   Patient was seen and examined on 12/08/2014

## 2014-12-08 NOTE — Progress Notes (Signed)
Patient became restless and pul IV out attempt to get up out of bed. Pt reorients easily, wife at bed side. Will Con to monitor. SRP, RN

## 2014-12-08 NOTE — Evaluation (Signed)
Physical Therapy Evaluation Patient Details Name: Darryl Ryan MRN: [id number] DOB: 1924/02/06 Today's Date: 12/08/2014   History of Present Illness  81 y.o. male is admitted to the hospital after presentation with nosebleed. He was also noted to have significant hypertension and evidence of acute renal failure.  PMHx: dementia, hypertension, Hep B, Dysphasia, chronic kidney disease, Pancytopenia, Multiple Myeloma   Clinical Impression  Pt admitted with above diagnosis. Pt currently with functional limitations due to the deficits listed below (see PT Problem List).  Pt will benefit from skilled PT to increase their independence and safety with mobility to allow discharge to the venue listed below.  Pt able to tolerate good distance ambulation today and anticipate no further needs upon d/c as pt appears close to his baseline.     Follow Up Recommendations No PT follow up    Equipment Recommendations  None recommended by PT    Recommendations for Other Services       Precautions / Restrictions Precautions Precautions: Fall      Mobility  Bed Mobility Overal bed mobility: Needs Assistance Bed Mobility: Supine to Sit     Supine to sit: Min assist     General bed mobility comments: slight assist for trunk due to pain  Transfers Overall transfer level: Needs assistance Equipment used: None Transfers: Sit to/from Stand Sit to Stand: Min guard         General transfer comment: verbal cues for safe technique  Ambulation/Gait Ambulation/Gait assistance: Min guard Ambulation Distance (Feet): 320 Feet Assistive device:  (IV pole) Gait Pattern/deviations: Step-through pattern;Decreased stride length     General Gait Details: pt pushed IV pole, steady with IV pole, no LOB observed, denies dizziness  Stairs            Wheelchair Mobility    Modified Rankin (Stroke Patients Only)       Balance                                              Pertinent Vitals/Pain Pain Assessment: Faces Faces Pain Scale: Hurts even more Pain Location: chronic back pain  Pain Descriptors / Indicators: Grimacing Pain Intervention(s): Monitored during session;Limited activity within patient's tolerance;Repositioned (spouse reports transitional movement typically cause pain, better once rest or on the move)    Home Living Family/patient expects to be discharged to:: Private residence Living Arrangements: Spouse/significant other   Type of Home: House Home Access: Ramped entrance     Home Layout: One level Home Equipment: Environmental consultant - 2 wheels      Prior Function Level of Independence: Independent               Hand Dominance        Extremity/Trunk Assessment               Lower Extremity Assessment: Generalized weakness         Communication   Communication: No difficulties  Cognition Arousal/Alertness: Awake/alert Behavior During Therapy: WFL for tasks assessed/performed Overall Cognitive Status: History of cognitive impairments - at baseline (dementia)                      General Comments      Exercises        Assessment/Plan    PT Assessment Patient needs continued PT services  PT Diagnosis Difficulty walking   PT  Problem List Decreased strength;Decreased activity tolerance;Decreased mobility  PT Treatment Interventions Gait training;DME instruction;Functional mobility training;Patient/family education;Therapeutic activities;Therapeutic exercise   PT Goals (Current goals can be found in the Care Plan section) Acute Rehab PT Goals PT Goal Formulation: With patient/family Time For Goal Achievement: 12/15/14 Potential to Achieve Goals: Good    Frequency Min 3X/week   Barriers to discharge        Co-evaluation               End of Session Equipment Utilized During Treatment: Gait belt Activity Tolerance: Patient tolerated treatment well Patient left: in chair;with chair alarm  set;with call bell/phone within reach;with family/visitor present           Time: 6438-3779 PT Time Calculation (min) (ACUTE ONLY): 16 min   Charges:   PT Evaluation $Initial PT Evaluation Tier I: 1 Procedure     PT G Codes:        Nefi Musich,KATHrine E 12/08/2014, 2:54 PM Carmelia Bake, PT, DPT 12/08/2014 Pager: [phone]

## 2014-12-08 NOTE — Consult Note (Signed)
[facility] CONSULT NOTE  Patient Care Team: Lajean Manes, MD as PCP - General (Internal Medicine)  CHIEF COMPLAINTS/PURPOSE OF CONSULTATION:  Acute renal failure, chronic pancytopenia, history of MGUS  HISTORY OF PRESENTING ILLNESS:  Darryl Ryan 81 y.o. male is admitted to the hospital after presentation with nosebleed. He was also noted to have significant hypertension and evidence of acute renal failure. He was subsequently admitted for further management. The patient is a poor historian and has reported history of dementia. Not much history is obtained from the patient's. I attempted to call his wife and unable to get hold of her. The patient was last seen at the cancer center 5 years ago for history of pancytopenia and was also follow for IgA MGUS.  He denies history of abnormal bone pain or bone fracture. Patient denies history of recurrent infection or atypical infections such as shingles of meningitis. Denies chills, night sweats. He states his appetite is poor and have some weight loss but he cannot quantify how much. In August 2016, he had constipation and lower back pain. He has CT scan with contrast at that time which showed kidney stones. Fatty liver infiltration were noted. Bilateral kidney cysts were noted. Degenerative changes were noted in his bones but no lytic lesions were noted. Lumbar spine x-ray from 11/24/2014 show no fracture. The patient had esophagram performed on 11/26/2014 for evaluation of dysphagia but he could not remember why. His baseline blood work from 10/26/2014 showed mild pancytopenia with white count 3.8, hemoglobin 9.8 with associated macrocytosis and low platelet count of 103. Serum creatinine at the time was 1.7. At the time of admission on 12/07/2014, white count was 3.5, hemoglobin 8.4, platelet count of 115 and evidence of acute renal failure with creatinine of 5.39. Hepatic panel was not drawn. He had remote history of bone  marrow biopsy in 2011 [id number]. At that time, it was done to evaluate for pancytopenia. 11% plasma cells were noted in that bone marrow biopsy although it is not clear whether they come in sheets or just scattered.  MEDICAL HISTORY:  Past Medical History  Diagnosis Date  . Hypertension   . Dementia   . Hepatitis B   . Dysphasia   . Chronic kidney disease     SURGICAL HISTORY: History reviewed. No pertinent past surgical history.  SOCIAL HISTORY: Social History   Social History  . Marital Status: Married    Spouse Name: N/A  . Number of Children: N/A  . Years of Education: N/A   Occupational History  . Not on file.   Social History Main Topics  . Smoking status: Never Smoker   . Smokeless tobacco: Never Used  . Alcohol Use: No  . Drug Use: Not on file  . Sexual Activity: No   Other Topics Concern  . Not on file   Social History Narrative    FAMILY HISTORY: History reviewed. No pertinent family history.  ALLERGIES:  is allergic to bee venom and tramadol.  MEDICATIONS:  Current Facility-Administered Medications  Medication Dose Route Frequency Provider Last Rate Last Dose  . 0.9 %  sodium chloride infusion   Intravenous Continuous Theressa Millard, MD 75 mL/hr at 12/08/14 0400    . acetaminophen (TYLENOL) tablet 650 mg  650 mg Oral Q6H PRN Theressa Millard, MD       Or  . acetaminophen (TYLENOL) suppository 650 mg  650 mg Rectal Q6H PRN Theressa Millard, MD      .  alum & mag hydroxide-simeth (MAALOX/MYLANTA) 200-200-20 MG/5ML suspension 30 mL  30 mL Oral Q6H PRN Theressa Millard, MD      . amLODipine (NORVASC) tablet 10 mg  10 mg Oral Daily Theressa Millard, MD   10 mg at 12/08/14 1019  . donepezil (ARICEPT) tablet 10 mg  10 mg Oral QHS Harvette Evonnie Dawes, MD      . doxazosin (CARDURA) tablet 2 mg  2 mg Oral Daily Theressa Millard, MD   2 mg at 12/08/14 1019  . hydrALAZINE (APRESOLINE) injection 10 mg  10 mg Intravenous Q6H PRN Theressa Millard, MD      . HYDROmorphone (DILAUDID) injection 0.5-1 mg  0.5-1 mg Intravenous Q3H PRN Theressa Millard, MD      . Derrill Memo ON 12/09/2014] Influenza vac split quadrivalent PF (FLUARIX) injection 0.5 mL  0.5 mL Intramuscular Tomorrow-1000 Reyne Dumas, MD      . memantine (NAMENDA) tablet 10 mg  10 mg Oral BID Theressa Millard, MD   10 mg at 12/08/14 1019  . metoprolol tartrate (LOPRESSOR) tablet 25 mg  25 mg Oral BID Theressa Millard, MD   25 mg at 12/08/14 1019  . multivitamin with minerals tablet 1 tablet  1 tablet Oral Daily Theressa Millard, MD   1 tablet at 12/08/14 1019  . omega-3 acid ethyl esters (LOVAZA) capsule 1 g  1 g Oral Daily Theressa Millard, MD   1 g at 12/08/14 1019  . ondansetron (ZOFRAN) tablet 4 mg  4 mg Oral Q6H PRN Theressa Millard, MD       Or  . ondansetron (ZOFRAN) injection 4 mg  4 mg Intravenous Q6H PRN Theressa Millard, MD      . oxyCODONE (Oxy IR/ROXICODONE) immediate release tablet 5 mg  5 mg Oral Q4H PRN Theressa Millard, MD   5 mg at 12/08/14 0400  . sodium chloride 0.9 % injection 3 mL  3 mL Intravenous Q12H Theressa Millard, MD   3 mL at 12/08/14 0400    REVIEW OF SYSTEMS:  Unreliable due to his dementia Eyes: Denies blurriness of vision, double vision or watery eyes Ears, nose, mouth, throat, and face: Denies mucositis or sore throat Respiratory: Denies cough, dyspnea or wheezes Cardiovascular: Denies palpitation, chest discomfort or lower extremity swelling Gastrointestinal:  Denies nausea, heartburn or change in bowel habits Skin: Denies abnormal skin rashes Lymphatics: Denies new lymphadenopathy or easy bruising Neurological:Denies numbness, tingling or new weaknesses Behavioral/Psych: Mood is stable, no new changes  All other systems were reviewed with the patient and are negative.  PHYSICAL EXAMINATION: ECOG PERFORMANCE STATUS: 1 - Symptomatic but completely ambulatory  Filed Vitals:   12/08/14 0651  BP: 158/77  Pulse: 68   Temp: 97.6 F (36.4 C)  Resp: 16   Filed Weights   12/07/14 1757 12/08/14 0114  Weight: 160 lb (72.576 kg) 157 lb 3 oz (71.3 kg)    GENERAL:alert, no distress and comfortable SKIN: skin color, texture, turgor are normal, no rashes or significant lesions EYES: normal, conjunctiva are pale and non-injected, sclera clear OROPHARYNX:no exudate, no erythema and lips, buccal mucosa, and tongue normal  NECK: supple, thyroid normal size, non-tender, without nodularity LYMPH:  no palpable lymphadenopathy in the cervical, axillary or inguinal LUNGS: clear to auscultation and percussion with normal breathing effort HEART: regular rate & rhythm and no murmurs and no lower extremity edema ABDOMEN:abdomen soft, non-tender and normal bowel sounds Musculoskeletal:no cyanosis of digits and  no clubbing  PSYCH: alert & oriented x 3 with fluent speech. He has very poor memory NEURO: no focal motor/sensory deficits  LABORATORY DATA:  I have reviewed the data as listed Lab Results  Component Value Date   WBC 3.8* 12/08/2014   HGB 8.4* 12/08/2014   HCT 25.1* 12/08/2014   MCV 101.6* 12/08/2014   PLT 117* 12/08/2014    RADIOGRAPHIC STUDIES: I have personally reviewed the radiological images as listed and agreed with the findings in the report. Dg Lumbar Spine 2-3 Views  11/24/2014   CLINICAL DATA:  Low back and bilateral hip pain for several weeks. No known injury. Initial encounter.  EXAM: LUMBAR SPINE - 2-3 VIEW  COMPARISON:  CT abdomen and pelvis is 10/26/2014.  FINDINGS: The patient has an acute or subacute superior endplate compression fracture of L1 with vertebral body height loss of approximately 30%. No other fracture is identified. Multilevel loss of disc space height is noted. Multiple bilateral renal stones are seen as on the prior examination.  IMPRESSION: Acute or subacute mild superior endplate compression fracture of L1.  Marked multilevel spondylosis.  Multiple bilateral renal stones.    Electronically Signed   By: Inge Rise M.D.   On: 11/24/2014 13:25   Dg Esophagus  11/26/2014   CLINICAL DATA:  Dysphagia  EXAM: ESOPHOGRAM/BARIUM SWALLOW  TECHNIQUE: Single contrast examination was performed using  thin barium.  FLUOROSCOPY TIME:  Radiation Exposure Index (as provided by the fluoroscopic device): 144 Gy per sq cm  If the device does not provide the exposure index:  Fluoroscopy Time:  2 minutes 0 second  Number of Acquired Images:  COMPARISON:  None.  FINDINGS: The study was begun in the direct lateral projection, to assess for possible aspiration. Rapid sequence spot films show the swallowing mechanism to be normal. No aspiration or penetration is seen. Only mild tertiary contractions are noted in the mid and distal esophagus. No definite hiatal hernia is seen. However there is an indentation upon the lower esophagus just above the gastroesophageal junction. On additional obliques, this appears to represent an intrinsic polypoid structure, and may represent a distal esophageal polyp. No gastroesophageal reflux is seen.  IMPRESSION: 1. Only mild tertiary contractions in the mid and distal esophagus. 2. Probable small polyp in the distal esophagus just above the gastroesophageal junction. No definite hernia or reflux is seen. 3. No aspiration or penetration is noted.   Electronically Signed   By: Ivar Drape M.D.   On: 11/26/2014 11:23    ASSESSMENT & PLAN:   Chronic worsening pancytopenia The patient have chronic pancytopenia. Cause is unknown. Prior bone marrow biopsy 5 years ago was nondiagnostic. I will order additional workup for this. The present time, he is not symptomatic and does not require transfusion. If further workup is unremarkable markable, he may need to have repeat bone marrow biopsy for further evaluation.  Acute on chronic renal failure Recent IV contrast use for CT scan in August 2016 Kidney stones IgA MGUS Poorly controlled hypertension I wonder whether  this is induced by recent contrast administration and lisinopril. The patient stated he had poor appetite and dehydration could very well cause acute renal failure. With a history of MGUS, it is important to exclude progression into multiple myeloma. I will order additional workup for this. I will return once the test results are available. I recommend nephrology to follow. He is currently receiving IV fluid resuscitation. Lisinopril has been discontinued  I will follow. Plan of care is  discussed with the hospitalist.  [facility], [place], MD 12/08/2014

## 2014-12-08 NOTE — Care Management Note (Signed)
Case Management Note  Patient Details  Name: Darryl Ryan MRN: [id number] Date of Birth: December 13, 1923  Subjective/Objective:   81 y/o m admitted w/AKI. From home.  PT cons. Await recc.               Action/Plan:d/c plan home.   Expected Discharge Date:                  Expected Discharge Plan:  Home/Self Care  In-House Referral:     Discharge planning Services  CM Consult  Post Acute Care Choice:    Choice offered to:     DME Arranged:    DME Agency:     HH Arranged:    HH Agency:     Status of Service:  In process, will continue to follow  Medicare Important Message Given:    Date Medicare IM Given:    Medicare IM give by:    Date Additional Medicare IM Given:    Additional Medicare Important Message give by:     If discussed at [place] of Stay Meetings, dates discussed:    Additional Comments:  Dessa Phi, RN 12/08/2014, 1:12 PM

## 2014-12-09 DIAGNOSIS — N189 Chronic kidney disease, unspecified: Secondary | ICD-10-CM

## 2014-12-09 DIAGNOSIS — N179 Acute kidney failure, unspecified: Secondary | ICD-10-CM | POA: Diagnosis present

## 2014-12-09 DIAGNOSIS — E876 Hypokalemia: Secondary | ICD-10-CM

## 2014-12-09 DIAGNOSIS — C9000 Multiple myeloma not having achieved remission: Secondary | ICD-10-CM

## 2014-12-09 DIAGNOSIS — D472 Monoclonal gammopathy: Secondary | ICD-10-CM

## 2014-12-09 DIAGNOSIS — I160 Hypertensive urgency: Secondary | ICD-10-CM | POA: Diagnosis present

## 2014-12-09 LAB — BASIC METABOLIC PANEL
Anion gap: 11 (ref 5–15)
BUN: 39 mg/dL — ABNORMAL HIGH (ref 6–20)
CALCIUM: 9.1 mg/dL (ref 8.9–10.3)
CO2: 27 mmol/L (ref 22–32)
CREATININE: 4.47 mg/dL — AB (ref 0.61–1.24)
Chloride: 104 mmol/L (ref 101–111)
GFR, EST AFRICAN AMERICAN: 12 mL/min — AB (ref >60)
GFR, EST NON AFRICAN AMERICAN: 11 mL/min — AB (ref >60)
Glucose, Bld: 99 mg/dL (ref 65–99)
Potassium: 3.9 mmol/L (ref 3.5–5.1)
SODIUM: 142 mmol/L (ref 135–145)

## 2014-12-09 LAB — KAPPA/LAMBDA LIGHT CHAINS
Kappa free light chain: 4086 mg/L — ABNORMAL HIGH (ref 3.30–19.40)
Kappa, lambda light chain ratio: 963.68 — ABNORMAL HIGH (ref 0.26–1.65)
Lambda free light chains: 4.24 mg/L — ABNORMAL LOW (ref 5.71–26.30)

## 2014-12-09 LAB — CBC
HCT: 23.5 % — ABNORMAL LOW (ref 39.0–52.0)
Hemoglobin: 8 g/dL — ABNORMAL LOW (ref 13.0–17.0)
MCH: 34.3 pg — ABNORMAL HIGH (ref 26.0–34.0)
MCHC: 34 g/dL (ref 30.0–36.0)
MCV: 100.9 fL — ABNORMAL HIGH (ref 78.0–100.0)
PLATELETS: 106 10*3/uL — AB (ref 150–400)
RBC: 2.33 MIL/uL — ABNORMAL LOW (ref 4.22–5.81)
RDW: 15.9 % — AB (ref 11.5–15.5)
WBC: 3.8 10*3/uL — AB (ref 4.0–10.5)

## 2014-12-09 LAB — CREATININE, URINE, RANDOM: Creatinine, Urine: 64.65 mg/dL

## 2014-12-09 LAB — IMMUNOFIXATION ELECTROPHORESIS
IGA: 3126 mg/dL — AB (ref 61–437)
IGG (IMMUNOGLOBIN G), SERUM: 264 mg/dL — AB (ref 700–1600)
IGM, SERUM: 27 mg/dL (ref 15–143)
TOTAL PROTEIN ELP: 7.6 g/dL (ref 6.0–8.5)

## 2014-12-09 LAB — PROTEIN ELECTROPHORESIS, SERUM
A/G Ratio: 0.6 — ABNORMAL LOW (ref 0.7–1.7)
ALBUMIN ELP: 2.7 g/dL — AB (ref 2.9–4.4)
ALPHA-1-GLOBULIN: 0.3 g/dL (ref 0.0–0.4)
Alpha-2-Globulin: 0.5 g/dL (ref 0.4–1.0)
BETA GLOBULIN: 4 g/dL — AB (ref 0.7–1.3)
GAMMA GLOBULIN: 0.3 g/dL — AB (ref 0.4–1.8)
Globulin, Total: 4.9 g/dL — ABNORMAL HIGH (ref 2.2–3.9)
M-SPIKE, %: 3.7 g/dL — AB
Total Protein ELP: 7.6 g/dL (ref 6.0–8.5)

## 2014-12-09 LAB — SODIUM, URINE, RANDOM: Sodium, Ur: 74 mmol/L

## 2014-12-09 MED ORDER — HYDRALAZINE HCL 10 MG PO TABS
10.0000 mg | ORAL_TABLET | Freq: Three times a day (TID) | ORAL | Status: DC
  Administered 2014-12-09 – 2014-12-14 (×15): 10 mg via ORAL
  Filled 2014-12-09 (×16): qty 1

## 2014-12-09 NOTE — Progress Notes (Signed)
Patient ID: Darryl Ryan, male   DOB: 12-20-1923, 81 y.o.   MRN: [id number] [facility] PROGRESS NOTE  Darryl Ryan DGU:[id number] DOB: Apr 08, 1923 DOA: 12/07/2014 PCP: Mathews Argyle, MD   Brief narrative:    81 yo male with HTN, CKD stage II - III, ? Recent diagnosis of MM, dementia, presented to [facility] for evaluation of epistaxis and while in ED, he was found to have elevated BP up to 194/85, elevated BUN/Cr = 44/5.39 (with last known baseline Cr 2.5). Pt's PCP has stopped Lisinopril as Cr continued to get worse. In ED pt was unable to provide history and by hospital day #1 wife was at the bedside able to provide more details. 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 Record review indicate chronic pancytopenia on 10/26/2014 WBC 3.8, Hg 9.8, Plt 103K.  In ED, blood work notable for WBC 3.5, Hg 8.3, Plt 115 and Cr up to 5.39. Pt was transferred to [facility] for further evaluation.   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 Lisinopril effect, contrast induced nephropathy (had Ct abd with contrast on 10/26/2014), dehydration and poor oral intake - renal US with no signs of hydronephrosis - oncologist following for work up of ? MM - follow upon on urine sodium and urine Cr to determine FENa - continue IVF for now and repeat BMP in AM  Active Problems:   Hypertensive urgency - currently on Norvasc and Metoprolol - will add Hydralazine scheduled and as needed as SBP is still in 150's    Acute on chronic pancytopenia - Cause is unknown. - Prior bone marrow biopsy 5 years ago was nondiagnostic. - Workup including iron studies B-12 etc. were unremarkable - per Dr. Alvy Bimler, plan on obtaining bone marrow biopsy tomorrow AM - no indication for transfusion at this time -  repeat CBC in AM    Epistaxis  - follow Plt count - this is now stable     Hypokalemia - supplemented and WNL this AM - BMP in AM    Dementia - stable and at baseline    MGUS history - important to exclude progression into multiple myeloma - oncologist assistance is greatly appreciated   DVT prophylaxis - Heparin SQ  Code Status: Full.  Family Communication:  plan of care discussed with the patient Disposition Plan: Home when stable.   IV access:  Peripheral IV  Procedures and diagnostic studies:    Dg Lumbar Spine 2-3 Views  11/24/2014   CLINICAL DATA:  Low back and bilateral hip pain for several weeks. No known injury. Initial encounter.  EXAM: LUMBAR SPINE - 2-3 VIEW  COMPARISON:  CT abdomen and pelvis is 10/26/2014.  FINDINGS: The patient has an acute or subacute superior endplate compression fracture of L1 with vertebral body height loss of approximately 30%. No other fracture is identified. Multilevel loss of disc space height is noted. Multiple bilateral renal stones are seen as on the prior examination.  IMPRESSION: Acute or subacute mild superior endplate compression fracture of L1.  Marked multilevel spondylosis.  Multiple bilateral renal stones.   Electronically Signed   By: Inge Rise M.D.   On: 11/24/2014 13:25   Dg Esophagus  11/26/2014   CLINICAL DATA:  Dysphagia  EXAM: ESOPHOGRAM/BARIUM SWALLOW  TECHNIQUE: Single  contrast examination was performed using  thin barium.  FLUOROSCOPY TIME:  Radiation Exposure Index (as provided by the fluoroscopic device): 144 Gy per sq cm  If the device does not provide the exposure index:  Fluoroscopy Time:  2 minutes 0 second  Number of Acquired Images:  COMPARISON:  None.  FINDINGS: The study was begun in the direct lateral projection, to assess for possible aspiration. Rapid sequence spot films show the swallowing mechanism to be normal. No aspiration or penetration is seen. Only mild tertiary contractions are noted in the mid and  distal esophagus. No definite hiatal hernia is seen. However there is an indentation upon the lower esophagus just above the gastroesophageal junction. On additional obliques, this appears to represent an intrinsic polypoid structure, and may represent a distal esophageal polyp. No gastroesophageal reflux is seen.  IMPRESSION: 1. Only mild tertiary contractions in the mid and distal esophagus. 2. Probable small polyp in the distal esophagus just above the gastroesophageal junction. No definite hernia or reflux is seen. 3. No aspiration or penetration is noted.   Electronically Signed   By: Ivar Drape M.D.   On: 11/26/2014 11:23   US Renal  12/08/2014   CLINICAL DATA:  Acute kidney injury.  EXAM: RENAL / URINARY TRACT ULTRASOUND COMPLETE  COMPARISON:  None.  FINDINGS: Right Kidney:  Length: 11.1 cm. Mildly increased parenchymal echogenicity. Lower pole cyst measures 4.2 x 3.1 x 3.2 cm. Upper pole cyst measuring 1.6 x 1.5 x 1.5 cm. Small, 4 mm, stone in the midpole. No hydronephrosis.  Left Kidney:  Length: 11.3 cm. Normal parenchymal echogenicity. Somewhat irregular renal sinus cyst measuring 3.4 x 2.5 x 2.9 cm. Mid and upper pole stones measuring 5-6 mm in size. No hydronephrosis.  Compared the prior ultrasound, right lower pole cyst is stable. Right upper pole cyst is mildly increased in size. Left renal sinus cyst is stable. Intrarenal stones seen currently were not evident on the prior ultrasound.  Bladder:  Appears normal for degree of bladder distention.  Prostate gland is heterogeneous, irregular and borderline enlarged measuring 4 x 3.1 x 4.5 cm.  Incidental note made of bilateral pleural effusions.  IMPRESSION: 1. Bilateral renal cysts and bilateral nonobstructing intrarenal stones. No hydronephrosis. 2. Increased parenchymal echogenicity from the right kidney consistent with medical renal disease.   Electronically Signed   By: Lajean Manes M.D.   On: 12/08/2014 16:53   Dg Bone Survey Met  12/08/2014    CLINICAL DATA:  81 year old male with history of monoclonal gammopathy. Evaluate for bone lesions.  EXAM: METASTATIC BONE SURVEY  COMPARISON:  None.  FINDINGS: Complete skeletal survey does not demonstrate definite acute fracture. No definite discrete lytic or sclerotic lesion identified. Multiple small sclerotic foci involving the L1 and L2 vertebra are indeterminate. There wall small lucencies involving the femoral neck bilaterally, likely related to osteopenia. MRI or nuclear medicine study may provide better evaluation of the osseous structures. There is L1 compression fracture, advanced from CT dated 10/26/2014. Clinical correlation is recommended. There is multilevel degenerative changes of spine.  IMPRESSION: L1 compression fracture, advanced since CT dated 10/26/2014.  In no definite suspicious lytic or sclerotic osseous lesions identified. Small sclerotic foci in the L1 and L2 vertebral bodies are indeterminate.   Electronically Signed   By: Anner Crete M.D.   On: 12/08/2014 18:47    Medical Consultants:  Oncology      Other Consultants:  PT     IAnti-Infectives:   None  Darryl Fowle,  MD  [facility] Pager [phone] Cell [phone]  If 7PM-7AM, please contact night-coverage [email or web address] Password [facility] 12/09/2014, 5:28 PM   LOS: 2 days   HPI/Subjective: No events overnight.   Objective: Filed Vitals:   12/09/14 0148 12/09/14 0526 12/09/14 1002 12/09/14 1346  BP: 163/73 161/86 161/77 156/81  Pulse: 70 79 78 71  Temp: 98.7 F (37.1 C) 99 F (37.2 C)  97.9 F (36.6 C)  TempSrc: Oral Oral  Oral  Resp: 16 16  20   Height:      Weight:      SpO2: 96% 96% 97% 98%    Intake/Output Summary (Last 24 hours) at 12/09/14 1728 Last data filed at 12/09/14 1350  Gross per 24 hour  Intake   1635 ml  Output   1125 ml  Net    510 ml    Exam:   General:  Pt is alert, follows commands appropriately, not in acute distress  Cardiovascular: Regular rate and rhythm, no  rubs, no gallops  Respiratory: Clear to auscultation bilaterally, no wheezing, no crackles, no rhonchi  Abdomen: Soft, non tender, non distended, bowel sounds present, no guarding  Extremities: pulses DP and PT palpable bilaterally  Neuro: Grossly nonfocal  Data Reviewed: Basic Metabolic Panel:  Recent Labs Lab 12/07/14 1840 12/08/14 0515 12/09/14 0454  NA 137 141 142  K 3.5 3.3* 3.9  CL 93* 100* 104  CO2 28 28 27   GLUCOSE 121* 120* 99  BUN 44* 41* 39*  CREATININE 5.39* 4.90* 4.47*  CALCIUM 9.3 8.9 9.1   Liver Function Tests:  Recent Labs Lab 12/08/14 1420  AST 16  ALT 9*  ALKPHOS 67  BILITOT 0.5  PROT 7.8  ALBUMIN 2.3*   CBC:  Recent Labs Lab 12/07/14 1840 12/08/14 0515 12/09/14 0454  WBC 3.5* 3.8* 3.8*  NEUTROABS 1.7  --   --   HGB 8.4* 8.4* 8.0*  HCT 25.7* 25.1* 23.5*  MCV 103.6* 101.6* 100.9*  PLT 115* 117* 106*   Scheduled Meds: . amLODipine  10 mg Oral Daily  . donepezil  10 mg Oral QHS  . doxazosin  2 mg Oral Daily  . Influenza vac split quadrivalent PF  0.5 mL Intramuscular Tomorrow-1000  . memantine  10 mg Oral BID  . metoprolol tartrate  25 mg Oral BID  . multivitamin with minerals  1 tablet Oral Daily  . omega-3 acid ethyl esters  1 g Oral Daily  . sodium chloride  3 mL Intravenous Q12H   Continuous Infusions: . sodium chloride 75 mL/hr at 12/09/14 0454

## 2014-12-09 NOTE — Care Management Important Message (Signed)
Important Message  Patient Details  Name: Darryl Ryan MRN: [id number] Date of Birth: 04/06/1923   Medicare Important Message Given:  Yes-second notification given    Camillo Flaming 12/09/2014, 1:20 [facility] Message  Patient Details  Name: Darryl Ryan MRN: [id number] Date of Birth: [birth year]   Medicare Important Message Given:  Yes-second notification given    Camillo Flaming 12/09/2014, 1:20 PM

## 2014-12-09 NOTE — Progress Notes (Signed)
Physical Therapy Treatment and Discharge from Acute PT  Patient Details Name: Darryl Ryan MRN: [id number] DOB: 08-23-1923 Today's Date: 01/05/15    History of Present Illness 81 y.o. male is admitted to the hospital after presentation with nosebleed. He was also noted to have significant hypertension and evidence of acute renal failure.  PMHx: dementia, hypertension, Hep B, Dysphasia, chronic kidney disease, Pancytopenia, Multiple Myeloma     PT Comments    Pt ambulated in hallway supervision level.  Family reports pt ambulated this morning once already.  Since pt at supervision level and spouse will be home upon d/c, PT to sign off.  Pt encouraged to ambulate with family/staff at least 3x/day during acute stay.   Follow Up Recommendations  No PT follow up     Equipment Recommendations  None recommended by PT    Recommendations for Other Services       Precautions / Restrictions Precautions Precautions: Fall    Mobility  Bed Mobility                  Transfers Overall transfer level: Needs assistance Equipment used: None Transfers: Sit to/from Stand Sit to Stand: Supervision            Ambulation/Gait Ambulation/Gait assistance: Supervision Ambulation Distance (Feet): 400 Feet Assistive device: None (pushed IV pole with both hands) Gait Pattern/deviations: Step-through pattern;Decreased stride length     General Gait Details: pt pushed IV pole with both hands - declined attempting without UE support so agreeable to use at least SPC upon d/c, steady with IV pole, no LOB observed, denies dizziness   Stairs            Wheelchair Mobility    Modified Rankin (Stroke Patients Only)       Balance                                    Cognition Arousal/Alertness: Awake/alert Behavior During Therapy: WFL for tasks assessed/performed Overall Cognitive Status: History of cognitive impairments - at baseline (dementia)                      Exercises      General Comments        Pertinent Vitals/Pain Pain Assessment: No/denies pain    Home Living                      Prior Function            PT Goals (current goals can now be found in the care plan section) Progress towards PT goals: Progressing toward goals    Frequency       PT Plan Other (comment) (d/c from acute PT)    Co-evaluation             End of Session   Activity Tolerance: Patient tolerated treatment well Patient left: in chair;with call bell/phone within reach;with chair alarm set;with family/visitor present     Time: 2952-8413 PT Time Calculation (min) (ACUTE ONLY): 9 min  Charges:  $Gait Training: 8-22 mins                    G Codes:      Delrick Dehart,KATHrine E 01-05-15, 1:20 PM Carmelia Bake, PT, DPT 01-05-2015 Pager: [phone]

## 2014-12-09 NOTE — Progress Notes (Signed)
Darryl Ryan   DOB:1923/09/15   AT#:[id number]    Subjective: He is feeling a bit better. No further bleeding is noted. His wife is by the bedside and fill in some of the history. He denies nausea. Denies pain. He received IV fluids overnight and underwent imaging study  Objective:  Filed Vitals:   12/09/14 1346  BP: 156/81  Pulse: 71  Temp: 97.9 F (36.6 C)  Resp: 20     Intake/Output Summary (Last 24 hours) at 12/09/14 1521 Last data filed at 12/09/14 1350  Gross per 24 hour  Intake   1635 ml  Output   1125 ml  Net    510 ml    GENERAL:alert, no distress and comfortable SKIN: skin color, texture, turgor are normal, no rashes or significant lesions EYES: normal, Conjunctiva are pale and non-injected, sclera clear OROPHARYNX:no exudate, no erythema and lips, buccal mucosa, and tongue normal  Musculoskeletal:no cyanosis of digits and no clubbing  NEURO: alert & oriented x 3 with fluent speech, no focal motor/sensory deficits   Labs:  Lab Results  Component Value Date   WBC 3.8* 12/09/2014   HGB 8.0* 12/09/2014   HCT 23.5* 12/09/2014   MCV 100.9* 12/09/2014   PLT 106* 12/09/2014   NEUTROABS 1.7 12/07/2014    Lab Results  Component Value Date   NA 142 12/09/2014   K 3.9 12/09/2014   CL 104 12/09/2014   CO2 27 12/09/2014    Studies:  US Renal  12/08/2014   CLINICAL DATA:  Acute kidney injury.  EXAM: RENAL / URINARY TRACT ULTRASOUND COMPLETE  COMPARISON:  None.  FINDINGS: Right Kidney:  Length: 11.1 cm. Mildly increased parenchymal echogenicity. Lower pole cyst measures 4.2 x 3.1 x 3.2 cm. Upper pole cyst measuring 1.6 x 1.5 x 1.5 cm. Small, 4 mm, stone in the midpole. No hydronephrosis.  Left Kidney:  Length: 11.3 cm. Normal parenchymal echogenicity. Somewhat irregular renal sinus cyst measuring 3.4 x 2.5 x 2.9 cm. Mid and upper pole stones measuring 5-6 mm in size. No hydronephrosis.  Compared the prior ultrasound, right lower pole cyst is stable. Right upper  pole cyst is mildly increased in size. Left renal sinus cyst is stable. Intrarenal stones seen currently were not evident on the prior ultrasound.  Bladder:  Appears normal for degree of bladder distention.  Prostate gland is heterogeneous, irregular and borderline enlarged measuring 4 x 3.1 x 4.5 cm.  Incidental note made of bilateral pleural effusions.  IMPRESSION: 1. Bilateral renal cysts and bilateral nonobstructing intrarenal stones. No hydronephrosis. 2. Increased parenchymal echogenicity from the right kidney consistent with medical renal disease.   Electronically Signed   By: Lajean Manes M.D.   On: 12/08/2014 16:53   Dg Bone Survey Met  12/08/2014   CLINICAL DATA:  81 year old male with history of monoclonal gammopathy. Evaluate for bone lesions.  EXAM: METASTATIC BONE SURVEY  COMPARISON:  None.  FINDINGS: Complete skeletal survey does not demonstrate definite acute fracture. No definite discrete lytic or sclerotic lesion identified. Multiple small sclerotic foci involving the L1 and L2 vertebra are indeterminate. There wall small lucencies involving the femoral neck bilaterally, likely related to osteopenia. MRI or nuclear medicine study may provide better evaluation of the osseous structures. There is L1 compression fracture, advanced from CT dated 10/26/2014. Clinical correlation is recommended. There is multilevel degenerative changes of spine.  IMPRESSION: L1 compression fracture, advanced since CT dated 10/26/2014.  In no definite suspicious lytic or sclerotic osseous lesions identified. Small sclerotic  foci in the L1 and L2 vertebral bodies are indeterminate.   Electronically Signed   By: Anner Crete M.D.   On: 12/08/2014 18:47    Assessment & Plan:   Chronic worsening pancytopenia The patient have chronic pancytopenia. Cause is unknown. Prior bone marrow biopsy 5 years ago was nondiagnostic. Workup including iron studies B-12 etc. were unremarkable I recommend repeat bone marrow  aspirate and biopsy and the patient agreed to proceed. I will proceed with unsedated bone marrow biopsy tomorrow at 8 AM. He does not need to be fasting The present time, he is not symptomatic and does not require transfusion.  Acute on chronic renal failure Recent IV contrast use for CT scan in August 2016 Kidney stones IgA MGUS Poorly controlled hypertension I wonder whether this is induced by recent contrast administration and lisinopril. Ultrasound showed no evidence of hydronephrosis The patient stated he had poor appetite and dehydration could very well caused acute renal failure. With a history of MGUS, it is important to exclude progression into multiple myeloma. I will order additional workup for this, SPEP is pending  He is currently receiving IV fluid resuscitation.  I will follow.  [facility], Panfilo Ketchum, MD 12/09/2014  3:21 PM  Amal Saiki, MD 12/09/2014

## 2014-12-09 NOTE — Clinical Documentation Improvement (Signed)
Hospitalist  Can the diagnosis of CKD be further specified?   CKD Stage I - GFR greater than or equal to 90  CKD Stage II - GFR 60-89  CKD Stage III - GFR 30-59  CKD Stage IV - GFR 15-29  CKD Stage V - GFR < 15  ESRD (End Stage Renal Disease)  Other condition  Unable to clinically determine   Supporting Information:  Patient with a history of chronic kidney disease per 9/14 progress notes.  Labs:  Bun   Creat   GFR: 9/15:    39       4.47     12 9/14:    41       4.90     11 9/13:    44       5.39     10  Please exercise your independent, professional judgment when responding. A specific answer is not anticipated or expected.   Thank You, [place] [phone]

## 2014-12-10 DIAGNOSIS — I129 Hypertensive chronic kidney disease with stage 1 through stage 4 chronic kidney disease, or unspecified chronic kidney disease: Secondary | ICD-10-CM | POA: Diagnosis not present

## 2014-12-10 LAB — BASIC METABOLIC PANEL
ANION GAP: 14 (ref 5–15)
BUN: 34 mg/dL — AB (ref 6–20)
CALCIUM: 9.3 mg/dL (ref 8.9–10.3)
CO2: 23 mmol/L (ref 22–32)
CREATININE: 3.85 mg/dL — AB (ref 0.61–1.24)
Chloride: 107 mmol/L (ref 101–111)
GFR calc Af Amer: 15 mL/min — ABNORMAL LOW (ref >60)
GFR, EST NON AFRICAN AMERICAN: 13 mL/min — AB (ref >60)
GLUCOSE: 116 mg/dL — AB (ref 65–99)
Potassium: 3.4 mmol/L — ABNORMAL LOW (ref 3.5–5.1)
Sodium: 144 mmol/L (ref 135–145)

## 2014-12-10 LAB — CBC WITH DIFFERENTIAL/PLATELET
Basophils Absolute: 0 10*3/uL (ref 0.0–0.1)
Basophils Relative: 0 %
EOS PCT: 3 %
Eosinophils Absolute: 0.1 10*3/uL (ref 0.0–0.7)
HEMATOCRIT: 24 % — AB (ref 39.0–52.0)
Hemoglobin: 7.9 g/dL — ABNORMAL LOW (ref 13.0–17.0)
LYMPHS ABS: 1.3 10*3/uL (ref 0.7–4.0)
LYMPHS PCT: 31 %
MCH: 33.6 pg (ref 26.0–34.0)
MCHC: 32.9 g/dL (ref 30.0–36.0)
MCV: 102.1 fL — AB (ref 78.0–100.0)
MONO ABS: 0.3 10*3/uL (ref 0.1–1.0)
MONOS PCT: 7 %
NEUTROS ABS: 2.4 10*3/uL (ref 1.7–7.7)
Neutrophils Relative %: 59 %
PLATELETS: 117 10*3/uL — AB (ref 150–400)
RBC: 2.35 MIL/uL — ABNORMAL LOW (ref 4.22–5.81)
RDW: 16.1 % — AB (ref 11.5–15.5)
WBC: 4.1 10*3/uL (ref 4.0–10.5)

## 2014-12-10 LAB — BONE MARROW EXAM: Bone Marrow Exam: 695

## 2014-12-10 NOTE — Progress Notes (Signed)
PT Cancellation Note / Screen  Patient Details Name: GENEVA PALLAS MRN: [id number] DOB: [birth year]   Cancelled Treatment:    Reason Eval/Treat Not Completed: PT screened, no needs identified, will sign off Received new order for PT evaluation.  Patient was evaluated 9/14 and seen again 9/15, please refer to these notes.  Pt mobilizing well and plans to ambulate with family during acute stay so PT has signed off.   LEMYRE,KATHrine E 12/10/2014, 8:20 AM Carmelia Bake, PT, DPT 12/10/2014 Pager: [phone]

## 2014-12-10 NOTE — Progress Notes (Signed)
Darryl Ryan   DOB:[birth year]   IH#:[id number]    I have seen the patient, examined him and edited the notes as follows  Subjective: He is feeling "miserable" this morning due to accidentally bedwetting this morning due to leaking catheter. No further bleeding is noted. His wife is by the bedside and fill in some of the history. Denies fever or chills, no night sweats. Denies chest pain or palpitations, or lower extremity edema.He denies nausea. Denies diarrhea. No bowel movements since admission. Denies pain. Wife reports mild confusion on this patient. He received IV fluids overnight   Scheduled Meds: . amLODipine  10 mg Oral Daily  . donepezil  10 mg Oral QHS  . doxazosin  2 mg Oral Daily  . hydrALAZINE  10 mg Oral 3 times per day  . Influenza vac split quadrivalent PF  0.5 mL Intramuscular Tomorrow-1000  . memantine  10 mg Oral BID  . metoprolol tartrate  25 mg Oral BID  . multivitamin with minerals  1 tablet Oral Daily  . omega-3 acid ethyl esters  1 g Oral Daily  . sodium chloride  3 mL Intravenous Q12H   Continuous Infusions: . sodium chloride 75 mL/hr at 12/09/14 1919   PRN Meds:.acetaminophen **OR** acetaminophen, alum & mag hydroxide-simeth, hydrALAZINE, HYDROmorphone (DILAUDID) injection, ondansetron **OR** ondansetron (ZOFRAN) IV, oxyCODONE   Objective:  Filed Vitals:   12/10/14 0556  BP: 143/67  Pulse: 98  Temp: 98 F (36.7 C)  Resp: 16     Intake/Output Summary (Last 24 hours) at 12/10/14 0747 Last data filed at 12/09/14 2303  Gross per 24 hour  Intake 1462.5 ml  Output   1350 ml  Net  112.5 ml    GENERAL:alert, no distress, uncomfortable after wetting his bed. SKIN: skin color, texture, turgor are normal, no rashes or significant lesions EYES: normal, Conjunctiva are pale and non-injected, sclera clear OROPHARYNX:no exudate, no erythema and lips, buccal mucosa, and tongue normal  Musculoskeletal:no cyanosis of digits and no clubbing  NEURO: alert &  oriented x 2, mildly confused,  no focal motor/sensory deficits   Labs:  Lab Results  Component Value Date   WBC 4.1 12/10/2014   HGB 7.9* 12/10/2014   HCT 24.0* 12/10/2014   MCV 102.1* 12/10/2014   PLT 117* 12/10/2014   NEUTROABS 2.4 12/10/2014    Lab Results  Component Value Date   NA 144 12/10/2014   K 3.4* 12/10/2014   CL 107 12/10/2014   CO2 23 12/10/2014   Urinalysis    Component Value Date/Time   COLORURINE YELLOW 12/07/2014 1945   APPEARANCEUR CLEAR 12/07/2014 1945   LABSPEC 1.008 12/07/2014 1945   PHURINE 6.5 12/07/2014 1945   GLUCOSEU NEGATIVE 12/07/2014 1945   HGBUR TRACE* 12/07/2014 1945   BILIRUBINUR NEGATIVE 12/07/2014 1945   KETONESUR NEGATIVE 12/07/2014 1945   PROTEINUR 30* 12/07/2014 1945   UROBILINOGEN 0.2 12/07/2014 1945   NITRITE NEGATIVE 12/07/2014 1945   LEUKOCYTESUR NEGATIVE 12/07/2014 1945     Studies:   METASTATIC BONE SURVEY  COMPARISON: None.  FINDINGS: Complete skeletal survey does not demonstrate definite acute fracture. No definite discrete lytic or sclerotic lesion identified.Multiple small sclerotic foci involving the L1 and L2 vertebra are indeterminate. There wall small lucencies involving the femoral neck bilaterally, likely related to osteopenia. MRI or nuclear medicine study may provide better evaluation of the osseous structures. There is L1 compression fracture, advanced from CT dated 10/26/2014. Clinical correlation is recommended. There is multilevel degenerative changes of spine.  IMPRESSION:  L1 compression fracture, advanced since CT dated 10/26/2014.  In no definite suspicious lytic or sclerotic osseous lesions identified. Small sclerotic foci in the L1 and L2 vertebral bodies are indeterminate. No new imaging studies  Assessment & Plan:   Chronic worsening pancytopenia The patient have chronic pancytopenia. Cause is unknown. Prior bone marrow biopsy 5 years ago was nondiagnostic. Workup including iron  studies B-12 etc. were unremarkable Metastatic bone survey on 9/14 shows no definite suspicious lytic or sclerotic lesions, except for a Small sclerotic foci in the L1 and L2 vertebral bodies Recommended repeat bone marrow aspirate and biopsy and the patient agreed to proceed to be performed today. The present time, he is not symptomatic and does not require transfusion.  Acute on chronic renal failure Recent IV contrast use for CT scan in August 2016 Kidney stones IgA MGUS Poorly controlled hypertension I wonder whether this is induced by recent contrast administration and lisinopril. Ultrasound showed no evidence of hydronephrosis The patient stated he had poor appetite and dehydration could very well caused acute renal failure. With a history of MGUS, it is important to exclude progression into multiple myeloma. Additional workup for this including Kappa/Lambda light chains, SPEP with immunofixation are pending  Renal failure improving slowly with IV fluid resuscitation.  Will follow.  Bone Marrow Biopsy and Aspiration Procedure Note   Informed consent was obtained and potential risks including bleeding, infection and pain were reviewed with the patient.   The patient's name, date of birth, identification, consent and allergies were verified prior to the start of procedure and time out was performed.  The left posterior iliac crest was chosen as the site of biopsy.  The skin was prepped with Betadine solution.   8 cc of 1% lidocaine was used to provide local anaesthesia.   10 cc of bone marrow aspirate was obtained followed by 1 inch biopsy. Multiple attempts were made to obtain biopsy due to fragile bone.  The procedure was tolerated well and there were no complications.  The patient was stable at the end of the procedure.  Specimens sent for flow cytometry, cytogenetics and additional studies.   Rondel Jumbo, PA-C 12/10/2014  7:47 AM GORSUCH, NI, MD 12/10/2014

## 2014-12-10 NOTE — Progress Notes (Signed)
Patient ID: JAVONNE DORKO, male   DOB: 01/14/1924, 81 y.o.   MRN: [id number] [facility] PROGRESS NOTE  TAYVIAN HOLYCROSS WER:[id number] DOB: Oct 29, 1923 DOA: 12/07/2014 PCP: Mathews Argyle, MD   Brief narrative:    81 yo male with HTN, CKD stage II - III, ? Recent diagnosis of MM, dementia, presented to [facility] for evaluation of epistaxis and while in ED, he was found to have elevated BP up to 194/85, elevated BUN/Cr = 44/5.39 (with last known baseline Cr 2.5). Pt's PCP has stopped Lisinopril as Cr continued to get worse. In ED pt was unable to provide history and by hospital day #1 wife was at the bedside able to provide more details. 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 Record review indicate chronic pancytopenia on 10/26/2014 WBC 3.8, Hg 9.8, Plt 103K.  In ED, blood work notable for WBC 3.5, Hg 8.3, Plt 115 and Cr up to 5.39. Pt was transferred to [facility] for further evaluation.   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 Lisinopril effect, contrast induced nephropathy (had Ct abd with contrast on 10/26/2014), dehydration and poor oral intake - renal US with no signs of hydronephrosis - oncologist following for work up of ? MM - Cr continues trending down  - urine sodium and urine Cr still pending    Active Problems:   Hypertensive urgency - currently on Norvasc and Metoprolol - added Hydralazine scheduled and as needed and BP more stable     Acute on chronic pancytopenia - Cause is unknown. - Prior bone marrow biopsy 5 years ago was nondiagnostic. - Workup including iron studies B-12 etc. were unremarkable - BM biopsy done this AM, follow upon results  - no indication for transfusion at this time - repeat CBC in AM    Epistaxis  - follow Plt count -  resolved     Hypokalemia - supplement and repeat BMP in AM    Dementia - stable and at baseline    MGUS history - important to exclude progression into multiple myeloma - oncologist assistance is greatly appreciated   DVT prophylaxis - Heparin SQ  Code Status: Full.  Family Communication:  plan of care discussed with the patient Disposition Plan: Home when stable.   IV access:  Peripheral IV  Procedures and diagnostic studies:    Dg Lumbar Spine 2-3 Views  11/24/2014   CLINICAL DATA:  Low back and bilateral hip pain for several weeks. No known injury. Initial encounter.  EXAM: LUMBAR SPINE - 2-3 VIEW  COMPARISON:  CT abdomen and pelvis is 10/26/2014.  FINDINGS: The patient has an acute or subacute superior endplate compression fracture of L1 with vertebral body height loss of approximately 30%. No other fracture is identified. Multilevel loss of disc space height is noted. Multiple bilateral renal stones are seen as on the prior examination.  IMPRESSION: Acute or subacute mild superior endplate compression fracture of L1.  Marked multilevel spondylosis.  Multiple bilateral renal stones.   Electronically Signed   By: Inge Rise M.D.   On: 11/24/2014 13:25   Dg Esophagus  11/26/2014   CLINICAL DATA:  Dysphagia  EXAM: ESOPHOGRAM/BARIUM SWALLOW  TECHNIQUE: Single contrast examination was performed using  thin barium.  FLUOROSCOPY TIME:  Radiation Exposure Index (as  provided by the fluoroscopic device): 144 Gy per sq cm  If the device does not provide the exposure index:  Fluoroscopy Time:  2 minutes 0 second  Number of Acquired Images:  COMPARISON:  None.  FINDINGS: The study was begun in the direct lateral projection, to assess for possible aspiration. Rapid sequence spot films show the swallowing mechanism to be normal. No aspiration or penetration is seen. Only mild tertiary contractions are noted in the mid and distal esophagus. No definite hiatal hernia is seen. However there is an  indentation upon the lower esophagus just above the gastroesophageal junction. On additional obliques, this appears to represent an intrinsic polypoid structure, and may represent a distal esophageal polyp. No gastroesophageal reflux is seen.  IMPRESSION: 1. Only mild tertiary contractions in the mid and distal esophagus. 2. Probable small polyp in the distal esophagus just above the gastroesophageal junction. No definite hernia or reflux is seen. 3. No aspiration or penetration is noted.   Electronically Signed   By: Ivar Drape M.D.   On: 11/26/2014 11:23   US Renal  12/08/2014   CLINICAL DATA:  Acute kidney injury.  EXAM: RENAL / URINARY TRACT ULTRASOUND COMPLETE  COMPARISON:  None.  FINDINGS: Right Kidney:  Length: 11.1 cm. Mildly increased parenchymal echogenicity. Lower pole cyst measures 4.2 x 3.1 x 3.2 cm. Upper pole cyst measuring 1.6 x 1.5 x 1.5 cm. Small, 4 mm, stone in the midpole. No hydronephrosis.  Left Kidney:  Length: 11.3 cm. Normal parenchymal echogenicity. Somewhat irregular renal sinus cyst measuring 3.4 x 2.5 x 2.9 cm. Mid and upper pole stones measuring 5-6 mm in size. No hydronephrosis.  Compared the prior ultrasound, right lower pole cyst is stable. Right upper pole cyst is mildly increased in size. Left renal sinus cyst is stable. Intrarenal stones seen currently were not evident on the prior ultrasound.  Bladder:  Appears normal for degree of bladder distention.  Prostate gland is heterogeneous, irregular and borderline enlarged measuring 4 x 3.1 x 4.5 cm.  Incidental note made of bilateral pleural effusions.  IMPRESSION: 1. Bilateral renal cysts and bilateral nonobstructing intrarenal stones. No hydronephrosis. 2. Increased parenchymal echogenicity from the right kidney consistent with medical renal disease.   Electronically Signed   By: Lajean Manes M.D.   On: 12/08/2014 16:53   Dg Bone Survey Met  12/08/2014   CLINICAL DATA:  81 year old male with history of monoclonal  gammopathy. Evaluate for bone lesions.  EXAM: METASTATIC BONE SURVEY  COMPARISON:  None.  FINDINGS: Complete skeletal survey does not demonstrate definite acute fracture. No definite discrete lytic or sclerotic lesion identified. Multiple small sclerotic foci involving the L1 and L2 vertebra are indeterminate. There wall small lucencies involving the femoral neck bilaterally, likely related to osteopenia. MRI or nuclear medicine study may provide better evaluation of the osseous structures. There is L1 compression fracture, advanced from CT dated 10/26/2014. Clinical correlation is recommended. There is multilevel degenerative changes of spine.  IMPRESSION: L1 compression fracture, advanced since CT dated 10/26/2014.  In no definite suspicious lytic or sclerotic osseous lesions identified. Small sclerotic foci in the L1 and L2 vertebral bodies are indeterminate.   Electronically Signed   By: Anner Crete M.D.   On: 12/08/2014 18:47    Medical Consultants:  Oncology      Other Consultants:  PT     IAnti-Infectives:   None  [name], MD  [facility] Pager [phone] Cell [phone]  If 7PM-7AM, please contact night-coverage [email or web address] Password [email or web address]  12/10/2014, 8:06 PM   LOS: 3 days   HPI/Subjective: No events overnight.   Objective: Filed Vitals:   12/10/14 1001 12/10/14 1025 12/10/14 1337 12/10/14 1824  BP: 126/56 125/56 175/81 138/66  Pulse: 88  89 77  Temp: 98.8 F (37.1 C)  99 F (37.2 C) 98.9 F (37.2 C)  TempSrc: Oral  Oral Oral  Resp: 20  20 20   Height:      Weight:      SpO2: 97%  96% 97%    Intake/Output Summary (Last 24 hours) at 12/10/14 2006 Last data filed at 12/10/14 1800  Gross per 24 hour  Intake   1740 ml  Output   1775 ml  Net    -35 ml    Exam:   General:  Pt is alert, follows commands appropriately, not in acute distress  Cardiovascular: Regular rate and rhythm, no rubs, no gallops  Respiratory: Clear to auscultation bilaterally, no  wheezing, no crackles, no rhonchi  Abdomen: Soft, non tender, non distended, bowel sounds present, no guarding  Extremities: pulses DP and PT palpable bilaterally  Neuro: Grossly nonfocal  Data Reviewed: Basic Metabolic Panel:  Recent Labs Lab 12/07/14 1840 12/08/14 0515 12/09/14 0454 12/10/14 0510  NA 137 141 142 144  K 3.5 3.3* 3.9 3.4*  CL 93* 100* 104 107  CO2 28 28 27 23   GLUCOSE 121* 120* 99 116*  BUN 44* 41* 39* 34*  CREATININE 5.39* 4.90* 4.47* 3.85*  CALCIUM 9.3 8.9 9.1 9.3   Liver Function Tests:  Recent Labs Lab 12/08/14 1420  AST 16  ALT 9*  ALKPHOS 67  BILITOT 0.5  PROT 7.8  ALBUMIN 2.3*   CBC:  Recent Labs Lab 12/07/14 1840 12/08/14 0515 12/09/14 0454 12/10/14 0510  WBC 3.5* 3.8* 3.8* 4.1  NEUTROABS 1.7  --   --  2.4  HGB 8.4* 8.4* 8.0* 7.9*  HCT 25.7* 25.1* 23.5* 24.0*  MCV 103.6* 101.6* 100.9* 102.1*  PLT 115* 117* 106* 117*   Scheduled Meds: . amLODipine  10 mg Oral Daily  . donepezil  10 mg Oral QHS  . doxazosin  2 mg Oral Daily  . hydrALAZINE  10 mg Oral 3 times per day  . memantine  10 mg Oral BID  . metoprolol tartrate  25 mg Oral BID  . multivitamin with minerals  1 tablet Oral Daily  . omega-3 acid ethyl esters  1 g Oral Daily  . sodium chloride  3 mL Intravenous Q12H   Continuous Infusions: . sodium chloride 75 mL/hr at 12/09/14 1919

## 2014-12-11 DIAGNOSIS — F0390 Unspecified dementia without behavioral disturbance: Secondary | ICD-10-CM

## 2014-12-11 LAB — CBC
HCT: 22.3 % — ABNORMAL LOW (ref 39.0–52.0)
Hemoglobin: 7.4 g/dL — ABNORMAL LOW (ref 13.0–17.0)
MCH: 33.6 pg (ref 26.0–34.0)
MCHC: 33.2 g/dL (ref 30.0–36.0)
MCV: 101.4 fL — AB (ref 78.0–100.0)
PLATELETS: 104 10*3/uL — AB (ref 150–400)
RBC: 2.2 MIL/uL — AB (ref 4.22–5.81)
RDW: 16.3 % — AB (ref 11.5–15.5)
WBC: 4.5 10*3/uL (ref 4.0–10.5)

## 2014-12-11 LAB — BASIC METABOLIC PANEL
ANION GAP: 10 (ref 5–15)
BUN: 32 mg/dL — ABNORMAL HIGH (ref 6–20)
CALCIUM: 9.4 mg/dL (ref 8.9–10.3)
CO2: 25 mmol/L (ref 22–32)
Chloride: 108 mmol/L (ref 101–111)
Creatinine, Ser: 3.57 mg/dL — ABNORMAL HIGH (ref 0.61–1.24)
GFR calc Af Amer: 16 mL/min — ABNORMAL LOW (ref >60)
GFR, EST NON AFRICAN AMERICAN: 14 mL/min — AB (ref >60)
GLUCOSE: 97 mg/dL (ref 65–99)
POTASSIUM: 3.7 mmol/L (ref 3.5–5.1)
SODIUM: 143 mmol/L (ref 135–145)

## 2014-12-11 LAB — ABO/RH: ABO/RH(D): O POS

## 2014-12-11 LAB — PREPARE RBC (CROSSMATCH)

## 2014-12-11 MED ORDER — SODIUM CHLORIDE 0.9 % IV SOLN
Freq: Once | INTRAVENOUS | Status: AC
  Administered 2014-12-11: 18:00:00 via INTRAVENOUS

## 2014-12-11 NOTE — Progress Notes (Signed)
Patient ID: Darryl Ryan, male   DOB: May 14, 1923, 81 y.o.   MRN: [id number] [facility] PROGRESS NOTE  Darryl Ryan DDU:[id number] DOB: 1923/10/11 DOA: 12/07/2014 PCP: Mathews Argyle, MD   Brief narrative:    81 yo male with HTN, CKD stage II - III, ? Recent diagnosis of MM, dementia, presented to [facility] for evaluation of epistaxis and while in ED, he was found to have elevated BP up to 194/85, elevated BUN/Cr = 44/5.39 (with last known baseline Cr 2.5). Pt's PCP has stopped Lisinopril as Cr continued to get worse. In ED pt was unable to provide history and by hospital day #1 wife was at the bedside able to provide more details. 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 Record review indicate chronic pancytopenia on 10/26/2014 WBC 3.8, Hg 9.8, Plt 103K.  In ED, blood work notable for WBC 3.5, Hg 8.3, Plt 115 and Cr up to 5.39. Pt was transferred to [facility] for further evaluation.   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 Lisinopril effect, contrast induced nephropathy (had Ct abd with contrast on 10/26/2014), dehydration and poor oral intake - FENa 3.6 consistent with ATN  - renal US with no signs of hydronephrosis - oncologist following for work up of ? MM - Cr continues trending down   Active Problems:   Hypertensive urgency - currently on Norvasc and Metoprolol - will add hydralazine 10 mg TID and continue hydralazine as needed     Acute on chronic pancytopenia - Cause is still unknown. - Prior bone marrow biopsy 5 years ago was nondiagnostic. - BM biopsy done 9/16, awaiting path report  - WBC is stable this AM  - Hg is down to 7.4, will transfuse one U PRBC today 9/17 - repeat CBC in AM    Acute on chronic low back pain - bone scan with lumbar  compression fracture - continue analgesia as needed  - PT evaluation - IR consult, > vertebroplasty benefit     Epistaxis  - follow Plt count - resolved     Hypokalemia - supplemented and WNL - BMP in AM    Dementia - stable and at baseline    MGUS history - important to exclude progression into multiple myeloma - oncologist assistance is greatly appreciated   DVT prophylaxis - Heparin SQ  Code Status: Full.  Family Communication:  plan of care discussed with the patient Disposition Plan: Home when stable.   IV access:  Peripheral IV  Procedures and diagnostic studies:    Dg Lumbar Spine 2-3 Views 11/24/2014  Acute or subacute mild superior endplate compression fracture of L1.  Marked multilevel spondylosis.  Multiple bilateral renal stones.     Dg Esophagus 11/26/2014  Only mild tertiary contractions in the mid and distal esophagus. 2. Probable small polyp in the distal esophagus just above the gastroesophageal junction. No definite hernia or reflux is seen. 3. No aspiration or penetration is noted.     US Renal 12/08/2014   Bilateral renal cysts and bilateral nonobstructing intrarenal stones. No hydronephrosis. 2. Increased parenchymal echogenicity from the right kidney consistent with medical renal disease.   Dg Bone Survey Met 12/08/2014  L1 compression fracture, advanced since CT dated 10/26/2014.  In no definite suspicious lytic or sclerotic  osseous lesions identified. Small sclerotic foci in the L1 and L2 vertebral bodies are indeterminate.    Medical Consultants:  Oncology   IR    Other Consultants:  PT     IAnti-Infectives:   None  [name], MD  [facility] Pager [phone] Cell [phone]  If 7PM-7AM, please contact night-coverage [email or web address] Password [email or web address] 12/11/2014, 1:53 PM   LOS: 4 days   HPI/Subjective: No events overnight. Still with low back pain   Objective: Filed Vitals:   12/10/14 2107 12/11/14 0144 12/11/14 0456 12/11/14 1005   BP: 163/78 149/72 170/89 175/81  Pulse: 88 74 88 88  Temp: 98.9 F (37.2 C) 98.7 F (37.1 C) 98.8 F (37.1 C) 98.1 F (36.7 C)  TempSrc: Oral Oral Oral Oral  Resp: 20 18 20 20   Height:      Weight:      SpO2: 95% 94% 97% 99%    Intake/Output Summary (Last 24 hours) at 12/11/14 1353 Last data filed at 12/11/14 0908  Gross per 24 hour  Intake    600 ml  Output    775 ml  Net   -175 ml    Exam:   General:  Pt is not in acute distress  Cardiovascular: Regular rate and rhythm, no rubs, no gallops  Respiratory: Clear to auscultation bilaterally, no wheezing, no crackles, no rhonchi  Abdomen: Soft, non tender, non distended, bowel sounds present, no guarding  Extremities: pulses DP and PT palpable bilaterally  Neuro: Grossly nonfocal  Data Reviewed: Basic Metabolic Panel:  Recent Labs Lab 12/07/14 1840 12/08/14 0515 12/09/14 0454 12/10/14 0510 12/11/14 0508  NA 137 141 142 144 143  K 3.5 3.3* 3.9 3.4* 3.7  CL 93* 100* 104 107 108  CO2 28 28 27 23 25   GLUCOSE 121* 120* 99 116* 97  BUN 44* 41* 39* 34* 32*  CREATININE 5.39* 4.90* 4.47* 3.85* 3.57*  CALCIUM 9.3 8.9 9.1 9.3 9.4   Liver Function Tests:  Recent Labs Lab 12/08/14 1420  AST 16  ALT 9*  ALKPHOS 67  BILITOT 0.5  PROT 7.8  ALBUMIN 2.3*   CBC:  Recent Labs Lab 12/07/14 1840 12/08/14 0515 12/09/14 0454 12/10/14 0510 12/11/14 0508  WBC 3.5* 3.8* 3.8* 4.1 4.5  NEUTROABS 1.7  --   --  2.4  --   HGB 8.4* 8.4* 8.0* 7.9* 7.4*  HCT 25.7* 25.1* 23.5* 24.0* 22.3*  MCV 103.6* 101.6* 100.9* 102.1* 101.4*  PLT 115* 117* 106* 117* 104*   Scheduled Meds: . amLODipine  10 mg Oral Daily  . donepezil  10 mg Oral QHS  . doxazosin  2 mg Oral Daily  . hydrALAZINE  10 mg Oral 3 times per day  . memantine  10 mg Oral BID  . metoprolol tartrate  25 mg Oral BID  . multivitamin with minerals  1 tablet Oral Daily  . omega-3 acid ethyl esters  1 g Oral Daily  . sodium chloride  3 mL Intravenous Q12H    Continuous Infusions:

## 2014-12-12 LAB — BASIC METABOLIC PANEL
ANION GAP: 13 (ref 5–15)
BUN: 31 mg/dL — ABNORMAL HIGH (ref 6–20)
CHLORIDE: 105 mmol/L (ref 101–111)
CO2: 23 mmol/L (ref 22–32)
Calcium: 9.3 mg/dL (ref 8.9–10.3)
Creatinine, Ser: 3.39 mg/dL — ABNORMAL HIGH (ref 0.61–1.24)
GFR calc non Af Amer: 15 mL/min — ABNORMAL LOW (ref >60)
GFR, EST AFRICAN AMERICAN: 17 mL/min — AB (ref >60)
Glucose, Bld: 106 mg/dL — ABNORMAL HIGH (ref 65–99)
POTASSIUM: 3.8 mmol/L (ref 3.5–5.1)
SODIUM: 141 mmol/L (ref 135–145)

## 2014-12-12 LAB — CBC
HEMATOCRIT: 24.8 % — AB (ref 39.0–52.0)
HEMOGLOBIN: 8.5 g/dL — AB (ref 13.0–17.0)
MCH: 34.3 pg — ABNORMAL HIGH (ref 26.0–34.0)
MCHC: 34.3 g/dL (ref 30.0–36.0)
MCV: 100 fL (ref 78.0–100.0)
Platelets: 103 10*3/uL — ABNORMAL LOW (ref 150–400)
RBC: 2.48 MIL/uL — AB (ref 4.22–5.81)
RDW: 16.9 % — ABNORMAL HIGH (ref 11.5–15.5)
WBC: 5.3 10*3/uL (ref 4.0–10.5)

## 2014-12-12 NOTE — Progress Notes (Signed)
   Request made for IR to consult for possible L1 VP/KP Plain films and Bone survey show L1 fx Will need cross sectional imaging per Dr Vernard Gambles  Will order MRI Await those results and pt examination

## 2014-12-12 NOTE — Progress Notes (Signed)
Patient ID: Darryl Ryan, male   DOB: [birth year], 81 y.o.   MRN: [id number] [facility] PROGRESS NOTE  Darryl Ryan GHW:[id number] DOB: 07-03-1923 DOA: 12/07/2014 PCP: Mathews Argyle, MD   Brief narrative:    81 yo male with HTN, CKD stage II - III, ? Recent diagnosis of MM, dementia, presented to [facility] for evaluation of epistaxis and while in ED, he was found to have elevated BP up to 194/85, elevated BUN/Cr = 44/5.39 (with last known baseline Cr 2.5). Pt's PCP has stopped Lisinopril as Cr continued to get worse. In ED pt was unable to provide history and by hospital day #1 wife was at the bedside able to provide more details. 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 Record review indicate chronic pancytopenia on 10/26/2014 WBC 3.8, Hg 9.8, Plt 103K.  In ED, blood work notable for WBC 3.5, Hg 8.3, Plt 115 and Cr up to 5.39. Pt was transferred to [facility] for further evaluation.   Assessment/Plan:    Principal Problem:   Acute on chronic renal failure stage III - IV, pre renal etiology progressed to ATN - this appears to be multifactorial and secondary to progressive renal disease in the setting of uncontrolled HTN, ? Lisinopril effect, contrast induced nephropathy (had Ct abd with contrast on 10/26/2014), dehydration and poor oral intake - FENa 3.6 consistent with ATN  - renal US with no signs of hydronephrosis - Cr continues trending down   Active Problems:   Hypertensive urgency - currently on Norvasc and Metoprolol - added hydralazine 10 mg TID but SBP still in 170's - will increase the dose to 25 mg PO TID - continue Hydralazine as needed     Acute on chronic pancytopenia - Cause is still unknown. - Prior bone marrow biopsy 5 years ago was nondiagnostic. - BM biopsy done 9/16, awaiting path report  - WBC is stable this AM  - Hg is appropriately up post transfusion of 1 U PRBC 9/17 from 7.4  --> 8.5 - repeat CBC in  AM    Acute on chronic low back pain - bone scan with lumbar compression fracture - continue analgesia as needed  - PT evaluation - IR consult, ? vertebroplasty benefit, follow upon recommendations     Epistaxis  - follow Plt count - resolved     Hypokalemia - supplemented and WNL    Dementia - stable and at baseline    MGUS history - important to exclude progression into multiple myeloma - oncologist assistance is greatly appreciated   DVT prophylaxis - Heparin SQ  Code Status: Full.  Family Communication:  plan of care discussed with the patient Disposition Plan: Home when stable, possibly in AM   IV access:  Peripheral IV  Procedures and diagnostic studies:    Dg Lumbar Spine 2-3 Views 11/24/2014  Acute or subacute mild superior endplate compression fracture of L1.  Marked multilevel spondylosis.  Multiple bilateral renal stones.     Dg Esophagus 11/26/2014  Only mild tertiary contractions in the mid and distal esophagus. 2. Probable small polyp in the distal esophagus just above the gastroesophageal junction. No definite hernia or reflux is seen. 3. No aspiration or penetration is noted.     US Renal 12/08/2014   Bilateral renal cysts and bilateral nonobstructing intrarenal stones. No hydronephrosis. 2. Increased parenchymal echogenicity from the right kidney consistent with medical renal disease.   Dg Bone Survey Met 12/08/2014  L1 compression fracture, advanced since CT dated 10/26/2014.  In no definite suspicious lytic or sclerotic osseous lesions identified. Small sclerotic foci in the L1 and L2 vertebral bodies are indeterminate.    Medical Consultants:  Oncology   IR    Other Consultants:  PT     IAnti-Infectives:   None  [name], MD  [facility] Pager [phone] Cell [phone]  If 7PM-7AM, please contact night-coverage [email or web address] Password [email or web address] 12/12/2014, 7:11 AM   LOS: 5 days   HPI/Subjective: No events overnight. Still with low back  pain   Objective: Filed Vitals:   12/11/14 2016 12/11/14 2200 12/11/14 2259 12/12/14 0532  BP: 170/82 149/74 153/78 170/80  Pulse: 88  84 76  Temp: 98.8 F (37.1 C)  99.1 F (37.3 C) 98.1 F (36.7 C)  TempSrc: Axillary  Oral Oral  Resp: 18  16 20   Height:      Weight:      SpO2: 96%  97% 96%    Intake/Output Summary (Last 24 hours) at 12/12/14 0711 Last data filed at 12/12/14 0600  Gross per 24 hour  Intake   1763 ml  Output   1052 ml  Net    711 ml    Exam:   General:  Pt is not in acute distress  Cardiovascular: Regular rate and rhythm, no rubs, no gallops  Respiratory: Clear to auscultation bilaterally, no wheezing, no crackles, no rhonchi  Abdomen: Soft, non tender, non distended, bowel sounds present, no guarding  Extremities: pulses DP and PT palpable bilaterally  Neuro: Grossly nonfocal  Data Reviewed: Basic Metabolic Panel:  Recent Labs Lab 12/08/14 0515 12/09/14 0454 12/10/14 0510 12/11/14 0508 12/12/14 0510  NA 141 142 144 143 141  K 3.3* 3.9 3.4* 3.7 3.8  CL 100* 104 107 108 105  CO2 28 27 23 25 23   GLUCOSE 120* 99 116* 97 106*  BUN 41* 39* 34* 32* 31*  CREATININE 4.90* 4.47* 3.85* 3.57* 3.39*  CALCIUM 8.9 9.1 9.3 9.4 9.3   Liver Function Tests:  Recent Labs Lab 12/08/14 1420  AST 16  ALT 9*  ALKPHOS 67  BILITOT 0.5  PROT 7.8  ALBUMIN 2.3*   CBC:  Recent Labs Lab 12/07/14 1840 12/08/14 0515 12/09/14 0454 12/10/14 0510 12/11/14 0508 12/12/14 0510  WBC 3.5* 3.8* 3.8* 4.1 4.5 5.3  NEUTROABS 1.7  --   --  2.4  --   --   HGB 8.4* 8.4* 8.0* 7.9* 7.4* 8.5*  HCT 25.7* 25.1* 23.5* 24.0* 22.3* 24.8*  MCV 103.6* 101.6* 100.9* 102.1* 101.4* 100.0  PLT 115* 117* 106* 117* 104* 103*   Scheduled Meds: . amLODipine  10 mg Oral Daily  . donepezil  10 mg Oral QHS  . doxazosin  2 mg Oral Daily  . hydrALAZINE  10 mg Oral 3 times per day  . memantine  10 mg Oral BID  . metoprolol tartrate  25 mg Oral BID  . multivitamin with  minerals  1 tablet Oral Daily  . omega-3 acid ethyl esters  1 g Oral Daily  . sodium chloride  3 mL Intravenous Q12H   Continuous Infusions:

## 2014-12-13 ENCOUNTER — Telehealth: Payer: Self-pay | Admitting: Hematology and Oncology

## 2014-12-13 ENCOUNTER — Other Ambulatory Visit: Payer: Self-pay | Admitting: Hematology and Oncology

## 2014-12-13 ENCOUNTER — Inpatient Hospital Stay (HOSPITAL_COMMUNITY): Payer: Medicare Other

## 2014-12-13 DIAGNOSIS — IMO0002 Reserved for concepts with insufficient information to code with codable children: Secondary | ICD-10-CM | POA: Insufficient documentation

## 2014-12-13 DIAGNOSIS — N189 Chronic kidney disease, unspecified: Secondary | ICD-10-CM

## 2014-12-13 DIAGNOSIS — M549 Dorsalgia, unspecified: Secondary | ICD-10-CM | POA: Insufficient documentation

## 2014-12-13 DIAGNOSIS — D472 Monoclonal gammopathy: Secondary | ICD-10-CM

## 2014-12-13 DIAGNOSIS — N179 Acute kidney failure, unspecified: Secondary | ICD-10-CM

## 2014-12-13 DIAGNOSIS — T148 Other injury of unspecified body region: Secondary | ICD-10-CM

## 2014-12-13 LAB — TYPE AND SCREEN
ABO/RH(D): O POS
Antibody Screen: NEGATIVE
Unit division: 0

## 2014-12-13 LAB — BASIC METABOLIC PANEL
Anion gap: 12 (ref 5–15)
BUN: 30 mg/dL — AB (ref 6–20)
CHLORIDE: 103 mmol/L (ref 101–111)
CO2: 23 mmol/L (ref 22–32)
CREATININE: 3.11 mg/dL — AB (ref 0.61–1.24)
Calcium: 9.4 mg/dL (ref 8.9–10.3)
GFR calc Af Amer: 19 mL/min — ABNORMAL LOW (ref >60)
GFR calc non Af Amer: 16 mL/min — ABNORMAL LOW (ref >60)
GLUCOSE: 102 mg/dL — AB (ref 65–99)
Potassium: 3.3 mmol/L — ABNORMAL LOW (ref 3.5–5.1)
SODIUM: 138 mmol/L (ref 135–145)

## 2014-12-13 LAB — CBC
HCT: 25.6 % — ABNORMAL LOW (ref 39.0–52.0)
HEMOGLOBIN: 8.6 g/dL — AB (ref 13.0–17.0)
MCH: 33.5 pg (ref 26.0–34.0)
MCHC: 33.6 g/dL (ref 30.0–36.0)
MCV: 99.6 fL (ref 78.0–100.0)
Platelets: 120 10*3/uL — ABNORMAL LOW (ref 150–400)
RBC: 2.57 MIL/uL — ABNORMAL LOW (ref 4.22–5.81)
RDW: 17.3 % — ABNORMAL HIGH (ref 11.5–15.5)
WBC: 4.9 10*3/uL (ref 4.0–10.5)

## 2014-12-13 MED ORDER — POTASSIUM CHLORIDE CRYS ER 20 MEQ PO TBCR
40.0000 meq | EXTENDED_RELEASE_TABLET | Freq: Once | ORAL | Status: AC
  Administered 2014-12-13: 40 meq via ORAL
  Filled 2014-12-13: qty 2

## 2014-12-13 NOTE — Care Management Important Message (Signed)
Important Message  Patient Details  Name: Darryl Ryan MRN: [id number] Date of Birth: 05-May-1923   Medicare Important Message Given:  Yes-third notification given    Camillo Flaming 12/13/2014, 11:02 AMImportant Message  Patient Details  Name: Darryl Ryan MRN: [id number] Date of Birth: Mar 30, 1923   Medicare Important Message Given:  Yes-third notification given    Camillo Flaming 12/13/2014, 11:02 AM

## 2014-12-13 NOTE — Care Management Note (Signed)
Case Management Note  Patient Details  Name: CIARAN BEGAY MRN: [id number] Date of Birth: 1923/04/29  Subjective/Objective: Spoke to patient/spouse about d/c plans. Informed awaiting recommendations ongoing.PT to f/u post IR eval VP/KP. Informed if dme items needed can arrange,& updated them on cost of custodial vs medically needed dme(3n1,shower chair,hospital bed)Provided also HHC, & private duty care list as resource.Patient/spouse voiced understanding.  Recommend HHRN/Nurse's aide.                 Action/Plan:d/c plan home w/HHC.   Expected Discharge Date:                  Expected Discharge Plan:  Home/Self Care  In-House Referral:     Discharge planning Services  CM Consult  Post Acute Care Choice:    Choice offered to:     DME Arranged:    DME Agency:     HH Arranged:    HH Agency:     Status of Service:  In process, will continue to follow  Medicare Important Message Given:  Yes-third notification given Date Medicare IM Given:    Medicare IM give by:    Date Additional Medicare IM Given:    Additional Medicare Important Message give by:     If discussed at [place] of Stay Meetings, dates discussed:    Additional Comments:  Dessa Phi, RN 12/13/2014, 12:04 PM

## 2014-12-13 NOTE — Progress Notes (Signed)
Patient ID: Darryl Ryan, male   DOB: 01/10/1924, 81 y.o.   MRN: [id number] [facility] PROGRESS NOTE  Darryl Ryan MQK:[id number] DOB: 07/04/1923 DOA: 12/07/2014 PCP: Mathews Argyle, MD   Brief narrative:    81 yo male with HTN, CKD stage II - III, ? Recent diagnosis of MM, dementia, presented to [facility] for evaluation of epistaxis and while in ED, he was found to have elevated BP up to 194/85, elevated BUN/Cr = 44/5.39 (with last known baseline Cr 2.5). Pt's PCP has stopped Lisinopril as Cr continued to get worse. In ED pt was unable to provide history and by hospital day #1 wife was at the bedside able to provide more details. 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 Record review indicate chronic pancytopenia on 10/26/2014 WBC 3.8, Hg 9.8, Plt 103K.  In ED, blood work notable for WBC 3.5, Hg 8.3, Plt 115 and Cr up to 5.39. Pt was transferred to [facility] for further evaluation.   Assessment/Plan:    Principal Problem:   Acute on chronic renal failure stage III - IV, pre renal etiology progressed to ATN - multifactorial, secondary to progressive renal disease in the setting of uncontrolled HTN, ? Lisinopril effect, contrast induced nephropathy (had Ct abd with contrast on 10/26/2014), dehydration and poor oral intake - FENa 3.6 consistent with ATN  - renal US with no signs of hydronephrosis - Cr continues trending down  - will repeat BMP in AM  Active Problems:   Hypertensive urgency - currently on Norvasc and Metoprolol, hydralazine 25 mg TID  - continue Hydralazine as needed     Acute on chronic pancytopenia - Cause is still unknown. - Prior bone marrow biopsy 5 years ago was nondiagnostic. - BM biopsy done 9/16, awaiting path report  - WBC is stable this AM  - Hg is appropriately up post transfusion of 1 U PRBC 9/17 from 7.4  --> 8.5 --> 8.6 - repeat CBC in AM    Acute on chronic low back pain - bone scan with  lumbar compression fracture - IR consult, ? vertebroplasty benefit, follow upon recommendations     Epistaxis  - follow Plt count - resolved     Hypokalemia - supplement and repeat BMP in AM    Dementia - stable and at baseline    MGUS history - important to exclude progression into multiple myeloma - oncologist assistance is greatly appreciated   DVT prophylaxis - Heparin SQ  Code Status: Full.  Family Communication:  plan of care discussed with the patient Disposition Plan: Home when stable, possibly in AM   IV access:  Peripheral IV  Procedures and diagnostic studies:    Dg Lumbar Spine 2-3 Views 11/24/2014  Acute or subacute mild superior endplate compression fracture of L1.  Marked multilevel spondylosis.  Multiple bilateral renal stones.     Dg Esophagus 11/26/2014  Only mild tertiary contractions in the mid and distal esophagus. 2. Probable small polyp in the distal esophagus just above the gastroesophageal junction. No definite hernia or reflux is seen. 3. No aspiration or penetration is noted.     US Renal 12/08/2014   Bilateral renal cysts and bilateral nonobstructing intrarenal stones. No hydronephrosis. 2. Increased parenchymal echogenicity from the right kidney consistent with medical renal disease.   Dg Bone Survey Met 12/08/2014  L1 compression fracture, advanced since CT dated 10/26/2014.  In no definite suspicious lytic or sclerotic osseous lesions  identified. Small sclerotic foci in the L1 and L2 vertebral bodies are indeterminate.    Medical Consultants:  Oncology   IR    Other Consultants:  PT     IAnti-Infectives:   None  [name], MD  [facility] Pager [phone] Cell [phone]  If 7PM-7AM, please contact night-coverage [email or web address] Password [facility] 12/13/2014, 12:35 PM   LOS: 6 days   HPI/Subjective: No events overnight. Still with low back pain   Objective: Filed Vitals:   12/12/14 1430 12/12/14 2139 12/13/14 0616 12/13/14 1021   BP: 159/81 158/75 164/67 158/73  Pulse: 79 88 82 81  Temp: 98.5 F (36.9 C) 98.2 F (36.8 C) 99.5 F (37.5 C) 98.5 F (36.9 C)  TempSrc: Oral Oral Oral Oral  Resp: 18 18 18 20   Height:      Weight:      SpO2: 97% 94% 94% 93%    Intake/Output Summary (Last 24 hours) at 12/13/14 1235 Last data filed at 12/13/14 0930  Gross per 24 hour  Intake    960 ml  Output    900 ml  Net     60 ml    Exam:   General:  Pt is not in acute distress  Cardiovascular: Regular rate and rhythm, no rubs, no gallops  Respiratory: Clear to auscultation bilaterally, no wheezing, no crackles, no rhonchi  Abdomen: Soft, non tender, non distended, bowel sounds present, no guarding  Extremities: pulses DP and PT palpable bilaterally  Neuro: Grossly nonfocal  Data Reviewed: Basic Metabolic Panel:  Recent Labs Lab 12/09/14 0454 12/10/14 0510 12/11/14 0508 12/12/14 0510 12/13/14 0532  NA 142 144 143 141 138  K 3.9 3.4* 3.7 3.8 3.3*  CL 104 107 108 105 103  CO2 27 23 25 23 23   GLUCOSE 99 116* 97 106* 102*  BUN 39* 34* 32* 31* 30*  CREATININE 4.47* 3.85* 3.57* 3.39* 3.11*  CALCIUM 9.1 9.3 9.4 9.3 9.4   Liver Function Tests:  Recent Labs Lab 12/08/14 1420  AST 16  ALT 9*  ALKPHOS 67  BILITOT 0.5  PROT 7.8  ALBUMIN 2.3*   CBC:  Recent Labs Lab 12/07/14 1840  12/09/14 0454 12/10/14 0510 12/11/14 0508 12/12/14 0510 12/13/14 0532  WBC 3.5*  < > 3.8* 4.1 4.5 5.3 4.9  NEUTROABS 1.7  --   --  2.4  --   --   --   HGB 8.4*  < > 8.0* 7.9* 7.4* 8.5* 8.6*  HCT 25.7*  < > 23.5* 24.0* 22.3* 24.8* 25.6*  MCV 103.6*  < > 100.9* 102.1* 101.4* 100.0 99.6  PLT 115*  < > 106* 117* 104* 103* 120*  < > = values in this interval not displayed. Scheduled Meds: . amLODipine  10 mg Oral Daily  . donepezil  10 mg Oral QHS  . doxazosin  2 mg Oral Daily  . hydrALAZINE  10 mg Oral 3 times per day  . memantine  10 mg Oral BID  . metoprolol tartrate  25 mg Oral BID  . multivitamin with  minerals  1 tablet Oral Daily  . omega-3 acid ethyl esters  1 g Oral Daily  . sodium chloride  3 mL Intravenous Q12H   Continuous Infusions:

## 2014-12-13 NOTE — Consult Note (Signed)
Chief Complaint: Patient was seen in consultation today for possible vertebroplasty/kyphoplasty Chief Complaint  Patient presents with  . Epistaxis    Referring Physician(s): [facility]  History of Present Illness: Darryl Ryan is a 81 y.o. male with past medical history significant for hypertension, coronary artery disease, nephrolithiasis, fatty liver, dementia, chronic kidney disease, chronic pancytopenia, IgA MGUS who was  recently admitted with nosebleed, worsening high blood pressure , acute on chronic renal failure and low back pain. Subsequent MRI of the lumbar spine performed on 9/19 revealed subacute compression fractures of T12, L1 and L4. There was low T1 marrow signal without obvious pathologic process. In addition there was multilevel disc disease and facet disease at multiple levels. Per discussion with patient's family, Darryl Ryan has been having back pain since lifting a computer at home in July 2016. His pain is primarily  located in the lower back region with some radiation to the anterior abdominal region. He denies associated lower extremity paresthesias or bowel/ bladder dysfunction at this time. Request has now been received for consideration of vertebroplasty/kyphoplasty.  Past Medical History  Diagnosis Date  . Hypertension   . Dementia   . Hepatitis B   . Dysphasia   . Chronic kidney disease     History reviewed. No pertinent past surgical history.  Allergies: Bee venom and Tramadol  Medications: Prior to Admission medications   Medication Sig Start Date End Date Taking? Authorizing Provider  amLODipine (NORVASC) 10 MG tablet Take 10 mg by mouth daily.   Yes Historical Provider, MD  Ascorbic Acid (VITAMIN C) 1000 MG tablet Take 1,000 mg by mouth daily.   Yes Historical Provider, MD  aspirin 325 MG tablet Take 325 mg by mouth daily.   Yes Historical Provider, MD  donepezil (ARICEPT) 10 MG tablet Take 10 mg by mouth at bedtime.   Yes Historical  Provider, MD  doxazosin (CARDURA) 2 MG tablet Take 2 mg by mouth daily.   Yes Historical Provider, MD  memantine (NAMENDA) 10 MG tablet Take 10 mg by mouth 2 (two) times daily.   Yes Historical Provider, MD  metoprolol tartrate (LOPRESSOR) 25 MG tablet Take 25 mg by mouth 2 (two) times daily.   Yes Historical Provider, MD  Multiple Vitamin (MULTIVITAMIN WITH MINERALS) TABS tablet Take 1 tablet by mouth daily.   Yes Historical Provider, MD  Omega-3 Fatty Acids (FISH OIL) 1000 MG CAPS Take 1 capsule by mouth daily.   Yes Historical Provider, MD     History reviewed. No pertinent family history.  Social History   Social History  . Marital Status: Married    Spouse Name: N/A  . Number of Children: N/A  . Years of Education: N/A   Social History Main Topics  . Smoking status: Never Smoker   . Smokeless tobacco: Never Used  . Alcohol Use: No  . Drug Use: None  . Sexual Activity: No   Other Topics Concern  . None   Social History Narrative      Review of Systems   Constitutional: Negative for fever and chills.  HENT: Positive for hearing loss and nosebleeds.   Respiratory:       Occasional cough and some dyspnea with exertion  Cardiovascular: Negative for chest pain.  Gastrointestinal: Positive for constipation. Negative for nausea, vomiting and blood in stool.       Intermittent abdominal pain  Genitourinary: Negative for dysuria and hematuria.  Musculoskeletal: Positive for back pain.  Neurological: Negative for seizures,  syncope and numbness.       Occasional headaches  Psychiatric/Behavioral:       History of dementia noted    Vital Signs: BP 146/73 mmHg  Pulse 75  Temp(Src) 98.8 F (37.1 C) (Oral)  Resp 20  Ht 5' 8"  (1.727 m)  Wt 157 lb 3 oz (71.3 kg)  BMI 23.91 kg/m2  SpO2 96%  Physical Exam  Constitutional: He appears well-developed and well-nourished.  Cardiovascular: Normal rate and regular rhythm.   Pulmonary/Chest: Effort normal.  Slightly  diminished breath sounds at bases bilat  Abdominal: Soft. Bowel sounds are normal.  Mild generalized tenderness  Musculoskeletal: Normal range of motion. He exhibits no edema.  Paravertebral tenderness noted from mid to lower back region  Neurological:  Alert and oriented 2 ,mild confusion    Mallampati Score:     Imaging: Dg Lumbar Spine 2-3 Views  11/24/2014   CLINICAL DATA:  Low back and bilateral hip pain for several weeks. No known injury. Initial encounter.  EXAM: LUMBAR SPINE - 2-3 VIEW  COMPARISON:  CT abdomen and pelvis is 10/26/2014.  FINDINGS: The patient has an acute or subacute superior endplate compression fracture of L1 with vertebral body height loss of approximately 30%. No other fracture is identified. Multilevel loss of disc space height is noted. Multiple bilateral renal stones are seen as on the prior examination.  IMPRESSION: Acute or subacute mild superior endplate compression fracture of L1.  Marked multilevel spondylosis.  Multiple bilateral renal stones.   Electronically Signed   By: Inge Rise M.D.   On: 11/24/2014 13:25   Mr Lumbar Spine Wo Contrast  12/13/2014   CLINICAL DATA:  L1 compression fracture. Back pain. Possible multiple myeloma.  EXAM: MRI LUMBAR SPINE WITHOUT CONTRAST  TECHNIQUE: Multiplanar, multisequence MR imaging of the lumbar spine was performed. No intravenous contrast was administered.  COMPARISON:  CT scan 10/26/2014  FINDINGS: Normal overall alignment of the lumbar vertebral bodies. Mild degenerative lumbar spondylosis with multilevel disc disease and facet disease. Subacute compression fracture of L1 involving the superior endplate. Similar findings involving the T12 and L4 vertebral bodies but these are more remote. Diffuse low T1 marrow signal without significant STIR signal abnormality. This could be due to osteoporosis, smoking, anemia or obesity.  No significant paraspinal or retroperitoneal findings. There are multiple renal cysts and  multiple renal calculi. There is also a stable right adrenal gland nodule.  L1-2: No significant findings. No retropulsion or canal compromise. No spinal or foraminal stenosis.  L2-3: Moderate degenerative disc disease and facet disease with a diffuse bulging annulus asymmetrical left contributing to mild spinal and bilateral lateral recess stenosis. There is also mild bilateral foraminal encroachment.  L3-4: Bulging annulus and osteophytic ridging with flattening of the ventral thecal sac and mild bilateral lateral recess stenosis. No significant spinal or foraminal stenosis.  L4-5: Diffuse bulging annulus and osteophytic ridging with flattening of the ventral thecal sac and mild bilateral lateral recess stenosis. No foraminal stenosis. Moderate facet disease but no significant spinal stenosis.  L5-S1: Degenerative disc disease and facet disease but no focal disc protrusion or spinal stenosis. There is mild bilateral foraminal stenosis.  IMPRESSION: 1. Subacute compression fractures of T12, L1 and L4. 2. Low T1 marrow signal without obvious pathologic process. 3. Multilevel disc disease and facet disease as discussed above at the individual levels.   Electronically Signed   By: Marijo Sanes M.D.   On: 12/13/2014 09:27   Dg Esophagus  11/26/2014   CLINICAL  DATA:  Dysphagia  EXAM: ESOPHOGRAM/BARIUM SWALLOW  TECHNIQUE: Single contrast examination was performed using  thin barium.  FLUOROSCOPY TIME:  Radiation Exposure Index (as provided by the fluoroscopic device): 144 Gy per sq cm  If the device does not provide the exposure index:  Fluoroscopy Time:  2 minutes 0 second  Number of Acquired Images:  COMPARISON:  None.  FINDINGS: The study was begun in the direct lateral projection, to assess for possible aspiration. Rapid sequence spot films show the swallowing mechanism to be normal. No aspiration or penetration is seen. Only mild tertiary contractions are noted in the mid and distal esophagus. No definite hiatal  hernia is seen. However there is an indentation upon the lower esophagus just above the gastroesophageal junction. On additional obliques, this appears to represent an intrinsic polypoid structure, and may represent a distal esophageal polyp. No gastroesophageal reflux is seen.  IMPRESSION: 1. Only mild tertiary contractions in the mid and distal esophagus. 2. Probable small polyp in the distal esophagus just above the gastroesophageal junction. No definite hernia or reflux is seen. 3. No aspiration or penetration is noted.   Electronically Signed   By: Ivar Drape M.D.   On: 11/26/2014 11:23   US Renal  12/08/2014   CLINICAL DATA:  Acute kidney injury.  EXAM: RENAL / URINARY TRACT ULTRASOUND COMPLETE  COMPARISON:  None.  FINDINGS: Right Kidney:  Length: 11.1 cm. Mildly increased parenchymal echogenicity. Lower pole cyst measures 4.2 x 3.1 x 3.2 cm. Upper pole cyst measuring 1.6 x 1.5 x 1.5 cm. Small, 4 mm, stone in the midpole. No hydronephrosis.  Left Kidney:  Length: 11.3 cm. Normal parenchymal echogenicity. Somewhat irregular renal sinus cyst measuring 3.4 x 2.5 x 2.9 cm. Mid and upper pole stones measuring 5-6 mm in size. No hydronephrosis.  Compared the prior ultrasound, right lower pole cyst is stable. Right upper pole cyst is mildly increased in size. Left renal sinus cyst is stable. Intrarenal stones seen currently were not evident on the prior ultrasound.  Bladder:  Appears normal for degree of bladder distention.  Prostate gland is heterogeneous, irregular and borderline enlarged measuring 4 x 3.1 x 4.5 cm.  Incidental note made of bilateral pleural effusions.  IMPRESSION: 1. Bilateral renal cysts and bilateral nonobstructing intrarenal stones. No hydronephrosis. 2. Increased parenchymal echogenicity from the right kidney consistent with medical renal disease.   Electronically Signed   By: Lajean Manes M.D.   On: 12/08/2014 16:53   Dg Bone Survey Met  12/08/2014   CLINICAL DATA:  81 year old male  with history of monoclonal gammopathy. Evaluate for bone lesions.  EXAM: METASTATIC BONE SURVEY  COMPARISON:  None.  FINDINGS: Complete skeletal survey does not demonstrate definite acute fracture. No definite discrete lytic or sclerotic lesion identified. Multiple small sclerotic foci involving the L1 and L2 vertebra are indeterminate. There wall small lucencies involving the femoral neck bilaterally, likely related to osteopenia. MRI or nuclear medicine study may provide better evaluation of the osseous structures. There is L1 compression fracture, advanced from CT dated 10/26/2014. Clinical correlation is recommended. There is multilevel degenerative changes of spine.  IMPRESSION: L1 compression fracture, advanced since CT dated 10/26/2014.  In no definite suspicious lytic or sclerotic osseous lesions identified. Small sclerotic foci in the L1 and L2 vertebral bodies are indeterminate.   Electronically Signed   By: Anner Crete M.D.   On: 12/08/2014 18:47    Labs:  CBC:  Recent Labs  12/10/14 0510 12/11/14 0508 12/12/14 0510 12/13/14  0532  WBC 4.1 4.5 5.3 4.9  HGB 7.9* 7.4* 8.5* 8.6*  HCT 24.0* 22.3* 24.8* 25.6*  PLT 117* 104* 103* 120*    COAGS:  Recent Labs  12/08/14 1420  INR 1.17  APTT 34    BMP:  Recent Labs  12/10/14 0510 12/11/14 0508 12/12/14 0510 12/13/14 0532  NA 144 143 141 138  K 3.4* 3.7 3.8 3.3*  CL 107 108 105 103  CO2 23 25 23 23   GLUCOSE 116* 97 106* 102*  BUN 34* 32* 31* 30*  CALCIUM 9.3 9.4 9.3 9.4  CREATININE 3.85* 3.57* 3.39* 3.11*  GFRNONAA 13* 14* 15* 16*  GFRAA 15* 16* 17* 19*    LIVER FUNCTION TESTS:  Recent Labs  12/08/14 1420  BILITOT 0.5  AST 16  ALT 9*  ALKPHOS 67  PROT 7.8  ALBUMIN 2.3*    TUMOR MARKERS: No results for input(s): AFPTM, CEA, CA199, CHROMGRNA in the last 8760 hours.  Assessment and Plan: Darryl Ryan is a 81 y.o. male with past medical history significant for hypertension, coronary artery  disease, nephrolithiasis, fatty liver, dementia, chronic kidney disease, chronic pancytopenia, IgA MGUS who was recently admitted with nosebleed, worsening high blood pressure , acute on chronic renal failure and low back pain. Subsequent MRI of the lumbar spine performed on 9/19 revealed subacute compression fractures of T12, L1 and L4. There was low T1 marrow signal without obvious pathologic process. In addition there  was multilevel disc disease and facet disease at multiple levels. Per discussion with patient's family Darryl Ryan has been having back pain since lifting a computer at home in July 2016. His pain is primarily  located in the lower back region was some radiation to the anterior abdominal region. He denies associated lower extremity paresthesias or bowel/ bladder dysfunction at this time. Request has now been received for consideration of vertebroplasty/kyphoplasty. Recent imaging studies have been reviewed by Dr. Pascal Lux. Plan at this time is to examine patient's thoracic and lumbar spine under fluoroscopy on 9/20 to determine extent of reproducible pain in area for potential vertebroplasty/kyphoplasty. Further recommendations will be made following this exam. Above plans were discussed with patient and family. Brief review of the details and associated risks of vertebroplasty/kyphoplasty were discussed with patient and family including but not limited to, internal bleeding, infection, nerve injury, and inability to eradicate back pain.   Thank you for this interesting consult.  I greatly enjoyed meeting Darryl Ryan and look forward to participating in their care.  A copy of this report was sent to the requesting provider on this date.  Signed: D. Rowe Robert 12/13/2014, 3:47 PM   I spent a total of 40 minutes in face to face in clinical consultation, greater than 50% of which was counseling/coordinating care for possible vertebroplasty/kyphoplasty.

## 2014-12-13 NOTE — Telephone Encounter (Signed)
[facility] f/u appt-s/w Janett Billow patient nurse and gave appt for 09/26 @ 12:30 w/Dr. Alvy Bimler

## 2014-12-13 NOTE — Progress Notes (Signed)
Darryl Ryan   DOB:[birth year]   YC#:[id number]    I have seen the patient, examined him and edited the notes as follows  Subjective: He is feeling well this morning.No further bleeding is noted. His wife is by the bedside and fill in some of the history. Denies fever or chills, no night sweats. Denies chest pain or palpitations, or lower extremity edema. He denies nausea. Denies diarrhea. Denies pain. Wife reports mild confusion on this patient. His lower back pain is present at the lumbar compression fracture region, which hinders his ability to be comfortable.  Scheduled Meds: . amLODipine  10 mg Oral Daily  . donepezil  10 mg Oral QHS  . doxazosin  2 mg Oral Daily  . hydrALAZINE  10 mg Oral 3 times per day  . memantine  10 mg Oral BID  . metoprolol tartrate  25 mg Oral BID  . multivitamin with minerals  1 tablet Oral Daily  . omega-3 acid ethyl esters  1 g Oral Daily  . sodium chloride  3 mL Intravenous Q12H   Continuous Infusions:   PRN Meds:.acetaminophen **OR** acetaminophen, alum & mag hydroxide-simeth, hydrALAZINE, HYDROmorphone (DILAUDID) injection, ondansetron **OR** ondansetron (ZOFRAN) IV, oxyCODONE   Objective:  Filed Vitals:   12/13/14 0616  BP: 164/67  Pulse: 82  Temp: 99.5 F (37.5 C)  Resp: 18     Intake/Output Summary (Last 24 hours) at 12/13/14 0817 Last data filed at 12/13/14 0700  Gross per 24 hour  Intake    840 ml  Output    600 ml  Net    240 ml    GENERAL:alert, no distress, uncomfortable after wetting his bed. SKIN: skin color, texture, turgor are normal, no rashes or significant lesions EYES: normal, Conjunctiva are pale and non-injected, sclera clear OROPHARYNX:no exudate, no erythema and lips, buccal mucosa, and tongue normal  Musculoskeletal:no cyanosis of digits and no clubbing  NEURO: alert & oriented x 2, mildly confused,  no focal motor/sensory deficits   Labs:  Lab Results  Component Value Date   WBC 4.9 12/13/2014   HGB 8.6*  12/13/2014   HCT 25.6* 12/13/2014   MCV 99.6 12/13/2014   PLT 120* 12/13/2014   NEUTROABS 2.4 12/10/2014    Lab Results  Component Value Date   NA 138 12/13/2014   K 3.3* 12/13/2014   CL 103 12/13/2014   CO2 23 12/13/2014   Urinalysis    Component Value Date/Time   COLORURINE YELLOW 12/07/2014 1945   APPEARANCEUR CLEAR 12/07/2014 1945   LABSPEC 1.008 12/07/2014 1945   PHURINE 6.5 12/07/2014 1945   GLUCOSEU NEGATIVE 12/07/2014 1945   HGBUR TRACE* 12/07/2014 1945   BILIRUBINUR NEGATIVE 12/07/2014 1945   KETONESUR NEGATIVE 12/07/2014 1945   PROTEINUR 30* 12/07/2014 1945   UROBILINOGEN 0.2 12/07/2014 1945   NITRITE NEGATIVE 12/07/2014 1945   LEUKOCYTESUR NEGATIVE 12/07/2014 1945     Studies:   No new imaging studies  Assessment & Plan:   Chronic worsening pancytopenia The patient have chronic pancytopenia. Cause is unknown. Prior bone marrow biopsy 5 years ago was nondiagnostic. Workup including iron studies B-12 etc. were unremarkable Metastatic bone survey on 9/14 shows no definite suspicious lytic or sclerotic lesions, except for a Small sclerotic foci in the L1 and L2 vertebral bodies Recommended repeat bone marrow aspirate and biopsy and the patient agreed to proceed, performed on 9/16 He received 1 unit of blood on 9/18 with good response. Will monitor  Acute on chronic renal failure Recent IV  contrast use for CT scan in August 2016 Kidney stones IgA MGUS Poorly controlled hypertension Renal failure likely induced by recent contrast administration and lisinopril. Ultrasound showed no evidence of hydronephrosis Renal failure improving slowly with IV fluid resuscitation. The patient stated he had poor appetite and dehydration could very well caused acute renal failure. With a history of MGUS, it is important to exclude progression into multiple myeloma. Additional workup for this including Kappa/Lambda light chains, SPEP with immunofixation are pending  Bone  marrow biopsy performed on 9/16, results pending  Acute on chronic back pain Metastatic bone survey shows lumbar compression fracture PT to see IR to see, to determine if patient is a candidate for vertebroplasty Continue tylenol and Dilaudid 0.5-1 IV q 3 hrs Consider increase oxycodone to 10 mg po q 4 hrs as needed prior to discharge with bowel support.   DVT prophylaxis On sq heparin   Other medical issues including hypertensive urgency, hypokalemia, dementia, as per primary team.  Will sign off. I have schedule return appointment next week on Monday, 12/20/2014 to review test results and repeat blood work.   Rondel Jumbo, PA-C 12/13/2014  8:17 AM   GORSUCH, NI, MD 12/13/2014

## 2014-12-14 ENCOUNTER — Inpatient Hospital Stay (HOSPITAL_COMMUNITY): Payer: Medicare Other

## 2014-12-14 ENCOUNTER — Telehealth (HOSPITAL_COMMUNITY): Payer: Self-pay | Admitting: Radiology

## 2014-12-14 ENCOUNTER — Encounter: Payer: Self-pay | Admitting: *Deleted

## 2014-12-14 LAB — BASIC METABOLIC PANEL
ANION GAP: 12 (ref 5–15)
BUN: 27 mg/dL — AB (ref 6–20)
CALCIUM: 9.2 mg/dL (ref 8.9–10.3)
CO2: 24 mmol/L (ref 22–32)
CREATININE: 2.94 mg/dL — AB (ref 0.61–1.24)
Chloride: 104 mmol/L (ref 101–111)
GFR calc Af Amer: 20 mL/min — ABNORMAL LOW (ref >60)
GFR, EST NON AFRICAN AMERICAN: 18 mL/min — AB (ref >60)
GLUCOSE: 102 mg/dL — AB (ref 65–99)
Potassium: 3.7 mmol/L (ref 3.5–5.1)
Sodium: 140 mmol/L (ref 135–145)

## 2014-12-14 LAB — CBC
HEMATOCRIT: 24.3 % — AB (ref 39.0–52.0)
Hemoglobin: 8.2 g/dL — ABNORMAL LOW (ref 13.0–17.0)
MCH: 33.6 pg (ref 26.0–34.0)
MCHC: 33.7 g/dL (ref 30.0–36.0)
MCV: 99.6 fL (ref 78.0–100.0)
PLATELETS: 106 10*3/uL — AB (ref 150–400)
RBC: 2.44 MIL/uL — ABNORMAL LOW (ref 4.22–5.81)
RDW: 17 % — AB (ref 11.5–15.5)
WBC: 4 10*3/uL (ref 4.0–10.5)

## 2014-12-14 MED ORDER — HYDRALAZINE HCL 10 MG PO TABS: 10.0000 mg | ORAL_TABLET | Freq: Three times a day (TID) | ORAL | Status: DC

## 2014-12-14 MED ORDER — OXYCODONE HCL 5 MG PO TABS: 5.0000 mg | ORAL_TABLET | ORAL | Status: DC | PRN

## 2014-12-14 NOTE — Consult Note (Signed)
   [facility] CM Inpatient Consult   12/14/2014  Darryl Ryan 1923/08/10 [id number]   Patient evaluated for [name] from long length of stay list.  Patient lives with wife. Explained [place] Management services to patient and wife. Consents obtained. Explained to [name] that they will receive post hospital transition of care calls and will be evaluated for monthly home visits. Noted patient is supposed to have home health with [facility] at discharge as well. Explained that [place] Management will not interfere or replace services provided by home health. Left [facility] Care Management packet at bedside. [name] reports she is hard of hearing and asks that calls be made on home phone at first as she hears better on the home phone. Confirmed best contact number as [phone]. Made inpatient RNCM aware [facility] to follow post discharge. Will request for [facility] RNCM to follow.  Marthenia Rolling, MSN-Ed, RN,BSN [facility] Liaison [phone]

## 2014-12-14 NOTE — Care Management Note (Signed)
Case Management Note  Patient Details  Name: Darryl Ryan MRN: [id number] Date of Birth: 08-Jan-1924  Subjective/Objective:[facility] chosen. Rep Sheppard Evens aware & following.HHRN/HH Nurse's aide, DME rep [place] aware & following.DME-hospital bed,shower chair, 3n1.Await HHC/DME orders.                    Action/Plan:d/c plan home w/HHC/DME.   Expected Discharge Date:                  Expected Discharge Plan:  [place]  In-House Referral:     Discharge planning Services  CM Consult  Post Acute Care Choice:    Choice offered to:     DME Arranged:    DME Agency:     HH Arranged:    [place] Agency:     Status of Service:  In process, will continue to follow  Medicare Important Message Given:  Yes-third notification given Date Medicare IM Given:    Medicare IM give by:    Date Additional Medicare IM Given:    Additional Medicare Important Message give by:     If discussed at [place] of Stay Meetings, dates discussed:    Additional Comments:  Dessa Phi, RN 12/14/2014, 12:47 PM

## 2014-12-14 NOTE — Patient Outreach (Signed)
[facility]) Care Management  12/14/2014  HAMZAH SAVOCA 02-Oct-1923 [id number]   Referral from Marthenia Rolling, RN to assign [employer], assigned Dannielle Huh, RN.  Thanks, Ronnell Freshwater. [place], [place] Assistant Phone: [phone] Fax: [phone]

## 2014-12-14 NOTE — Discharge Instructions (Signed)
Acute Kidney Injury Acute kidney injury is a disease in which there is sudden (acute) damage to the kidneys. The kidneys are 2 organs that lie on either side of the spine between the middle of the back and the front of the abdomen. The kidneys:  Remove wastes and extra water from the blood.   Produce important hormones. These help keep bones strong, regulate blood pressure, and help create red blood cells.   Balance the fluids and chemicals in the blood and tissues. A small amount of kidney damage may not cause problems, but a large amount of damage may make it difficult or impossible for the kidneys to work the way they should. Acute kidney injury may develop into long-lasting (chronic) kidney disease. It may also develop into a life-threatening disease called end-stage kidney disease. Acute kidney injury can get worse very quickly, so it should be treated right away. Early treatment may prevent other kidney diseases from developing.  CAUSES   A problem with blood flow to the kidneys. This may be caused by:   Blood loss.   Heart disease.   Severe burns.   Liver disease.  Direct damage to the kidneys. This may be caused by:  Some medicines.   A kidney infection.   Poisoning or consuming toxic substances.   A surgical wound.   A blow to the kidney area.   A problem with urine flow. This may be caused by:   Cancer.   Kidney stones.   An enlarged prostate. SYMPTOMS   Swelling (edema) of the legs, ankles, or feet.   Tiredness (lethargy).   Nausea or vomiting.   Confusion.   Problems with urination, such as:   Painful or burning feeling during urination.   Decreased urine production.   Frequent accidents in children who are potty trained.   Bloody urine.   Muscle twitches and cramps.   Shortness of breath.   Seizures.   Chest pain or pressure. Sometimes, no symptoms are present. DIAGNOSIS Acute kidney injury may be detected  and diagnosed by tests, including blood, urine, imaging, or kidney biopsy tests.  TREATMENT Treatment of acute kidney injury varies depending on the cause and severity of the kidney damage. In mild cases, no treatment may be needed. The kidneys may heal on their own. If acute kidney injury is more severe, your caregiver will treat the cause of the kidney damage, help the kidneys heal, and prevent complications from occurring. Severe cases may require a procedure to remove toxic wastes from the body (dialysis) or surgery to repair kidney damage. Surgery may involve:   Repair of a torn kidney.   Removal of an obstruction. Most of the time, you will need to stay overnight at the hospital.  HOME CARE INSTRUCTIONS:  Follow your prescribed diet.  Only take over-the-counter or prescription medicines as directed by your caregiver.  Do not take any new medicines (prescription, over-the-counter, or nutritional supplements) unless approved by your caregiver. Many medicines can worsen your kidney damage or need to have the dose adjusted.   Keep all follow-up appointments as directed by your caregiver.  Observe your condition to make sure you are healing as expected. SEEK IMMEDIATE MEDICAL CARE IF:  You are feeling ill or have severe pain in the back or side.   Your symptoms return or you have new symptoms.  You have any symptoms of end-stage kidney disease. These include:   Persistent itchiness.   Loss of appetite.   Headaches.   Abnormally dark   or light skin.  Numbness in the hands or feet.   Easy bruising.   Frequent hiccups.   Menstruation stops.   You have a fever.  You have increased urine production.  You have pain or bleeding when urinating. MAKE SURE YOU:   Understand these instructions.  Will watch your condition.  Will get help right away if you are not doing well or get worse Document Released: 09/25/2010 Document Revised: 07/07/2012 Document  Reviewed: 11/09/2011 ExitCare Patient Information 2015 [employer]. This information is not intended to replace advice given to you by your health care provider. Make sure you discuss any questions you have with your health care provider.  

## 2014-12-14 NOTE — Discharge Summary (Signed)
Physician Discharge Summary  Darryl Ryan ZGY:[id number] DOB: 01/04/1924 DOA: 12/07/2014  PCP: Mathews Argyle, MD  Admit date: 12/07/2014 Discharge date: 12/14/2014  Recommendations for Outpatient Follow-up:  1. Pt will need to follow up with PCP in 2-3 weeks post discharge 2. Please obtain BMP to evaluate electrolytes and kidney function 3. Please also check CBC to evaluate Hg and Hct levels 4. Needs to have follow up for bone marrow biopsy   Discharge Diagnoses:  Principal Problem:   Hypertensive urgency Active Problems:   Other pancytopenia   Acute on chronic renal failure   Epistaxis   Multiple myeloma   MGUS (monoclonal gammopathy of unknown significance)   Hypokalemia   Dementia   Acute kidney injury   Acute renal failure syndrome   Compression fracture   Back pain  Discharge Condition: Stable  Diet recommendation: Heart healthy diet discussed in details   Brief narrative:    81 yo male with HTN, CKD stage II - III, ? Recent diagnosis of MM, dementia, presented to [facility] for evaluation of epistaxis and while in ED, he was found to have elevated BP up to 194/85, elevated BUN/Cr = 44/5.39 (with last known baseline Cr 2.5). Pt's PCP has stopped Lisinopril as Cr continued to get worse. In ED pt was unable to provide history and by hospital day #1 wife was at the bedside able to provide more details. 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 Record review indicate chronic pancytopenia on 10/26/2014 WBC 3.8, Hg 9.8, Plt 103K.  In ED, blood work notable for WBC 3.5, Hg 8.3, Plt 115 and Cr up to 5.39. Pt was transferred to [facility] for further evaluation.   Assessment/Plan:    Principal Problem:  Acute on chronic renal failure stage III - IV, pre renal etiology progressed to ATN - multifactorial, secondary to progressive renal disease in the setting of uncontrolled HTN, ? Lisinopril effect, contrast induced nephropathy  (had Ct abd with contrast on 10/26/2014), dehydration and poor oral intake - FENa 3.6 consistent with ATN  - renal US with no signs of hydronephrosis - Cr continues trending down  - will need outpatient follow up  Active Problems:  Hypertensive urgency - resolved    Acute on chronic pancytopenia - Cause is still unknown. - Prior bone marrow biopsy 5 years ago was nondiagnostic. - BM biopsy done 9/16, awaiting path report will need outpatient follow up  - WBC is stable this AM  - Hg is appropriately up post transfusion of 1 U PRBC 9/17 from 7.4 --> 8.5 --> 8.6   Acute on chronic low back pain - bone scan with lumbar compression fracture - IR consult, ? vertebroplasty benefit, IR saw in consultation, pt not a candidate for procedure - will need to have home health PT set up upon discharge    Epistaxis  - follow Plt count - resolved    Hypokalemia - supplemented prior to discharge    Dementia - stable and at baseline   MGUS history - important to exclude progression into multiple myeloma - oncologist assistance is greatly appreciated    Code Status: Full.  Family Communication: plan of care discussed with the patient and wife at bedside  Disposition Plan: Home   IV access:  Peripheral IV  Procedures and diagnostic studies:   Dg Lumbar Spine 2-3 Views 11/24/2014 Acute or subacute mild superior endplate compression fracture of L1. Marked multilevel spondylosis. Multiple bilateral renal  stones.   Dg Esophagus 11/26/2014 Only mild tertiary contractions in the mid and distal esophagus. 2. Probable small polyp in the distal esophagus just above the gastroesophageal junction. No definite hernia or reflux is seen. 3. No aspiration or penetration is noted.   US Renal 12/08/2014 Bilateral renal cysts and bilateral nonobstructing intrarenal stones. No hydronephrosis. 2. Increased parenchymal echogenicity from the right kidney consistent with medical renal  disease.   Dg Bone Survey Met 12/08/2014 L1 compression fracture, advanced since CT dated 10/26/2014. In no definite suspicious lytic or sclerotic osseous lesions identified. Small sclerotic foci in the L1 and L2 vertebral bodies are indeterminate.   Medical Consultants:  Oncology  IR  Other Consultants:  PT  IAnti-Infectives:   None  [name], MD [facility] Pager [phone] Cell [phone]       Discharge Exam: Filed Vitals:   12/14/14 0900  BP: 147/66  Pulse:   Temp:   Resp:    Filed Vitals:   12/13/14 2013 12/14/14 0411 12/14/14 0746 12/14/14 0900  BP: 169/80 179/82 171/86 147/66  Pulse: 86 80 85   Temp: 98.9 F (37.2 C) 98.3 F (36.8 C) 98.4 F (36.9 C)   TempSrc: Oral Oral Oral   Resp: 18 20 20    Height:      Weight:      SpO2: 92% 96% 94%     General: Pt is alert, follows commands appropriately, not in acute distress Cardiovascular: Regular rate and rhythm, S1/S2 +, no murmurs, no rubs, no gallops Respiratory: Clear to auscultation bilaterally, no wheezing, no crackles, no rhonchi Abdominal: Soft, non tender, non distended, bowel sounds +, no guarding  Discharge Instructions     Medication List    TAKE these medications        amLODipine 10 MG tablet  Commonly known as:  NORVASC  Take 10 mg by mouth daily.     aspirin 325 MG tablet  Take 325 mg by mouth daily.     donepezil 10 MG tablet  Commonly known as:  ARICEPT  Take 10 mg by mouth at bedtime.     doxazosin 2 MG tablet  Commonly known as:  CARDURA  Take 2 mg by mouth daily.     Fish Oil 1000 MG Caps  Take 1 capsule by mouth daily.     hydrALAZINE 10 MG tablet  Commonly known as:  APRESOLINE  Take 1 tablet (10 mg total) by mouth every 8 (eight) hours.     memantine 10 MG tablet  Commonly known as:  NAMENDA  Take 10 mg by mouth 2 (two) times daily.     metoprolol tartrate 25 MG tablet   Commonly known as:  LOPRESSOR  Take 25 mg by mouth 2 (two) times daily.     multivitamin with minerals Tabs tablet  Take 1 tablet by mouth daily.     oxyCODONE 5 MG immediate release tablet  Commonly known as:  Oxy IR/ROXICODONE  Take 1 tablet (5 mg total) by mouth every 4 (four) hours as needed for moderate pain.     vitamin C 1000 MG tablet  Take 1,000 mg by mouth daily.           Follow-up Information    Follow up with [facility], NI, MD. Go on 12/20/2014.   Specialty:  Hematology and Oncology   Why:  at 12:30pm   Contact information:   [place] [id number] [phone]       Follow up with Lajean Manes  Marcello Moores, MD.   Specialty:  Internal Medicine   Contact information:   [address] [place] [address] [phone]       Follow up with [name], MD.   Specialty:  Internal Medicine   Why:  As needed call my cell phone [phone]   Contact information:   [address] [place] [address] [phone]        The results of significant diagnostics from this hospitalization (including imaging, microbiology, ancillary and laboratory) are listed below for reference.     Microbiology: No results found for this or any previous visit (from the past 240 hour(s)).   Labs: Basic Metabolic Panel:  Recent Labs Lab 12/10/14 0510 12/11/14 0508 12/12/14 0510 12/13/14 0532 12/14/14 0542  NA 144 143 141 138 140  K 3.4* 3.7 3.8 3.3* 3.7  CL 107 108 105 103 104  CO2 23 25 23 23 24   GLUCOSE 116* 97 106* 102* 102*  BUN 34* 32* 31* 30* 27*  CREATININE 3.85* 3.57* 3.39* 3.11* 2.94*  CALCIUM 9.3 9.4 9.3 9.4 9.2   Liver Function Tests:  Recent Labs Lab 12/08/14 1420  AST 16  ALT 9*  ALKPHOS 67  BILITOT 0.5  PROT 7.8  ALBUMIN 2.3*   CBC:  Recent Labs Lab 12/07/14 1840  12/10/14 0510 12/11/14 0508 12/12/14 0510 12/13/14 0532 12/14/14 0542  WBC 3.5*  < > 4.1 4.5 5.3 4.9 4.0   NEUTROABS 1.7  --  2.4  --   --   --   --   HGB 8.4*  < > 7.9* 7.4* 8.5* 8.6* 8.2*  HCT 25.7*  < > 24.0* 22.3* 24.8* 25.6* 24.3*  MCV 103.6*  < > 102.1* 101.4* 100.0 99.6 99.6  PLT 115*  < > 117* 104* 103* 120* 106*  < > = values in this interval not displayed.   SIGNED: Time coordinating discharge: 30 minutes  [name], MD  [facility] 12/14/2014, 10:21 AM Pager [phone]  If 7PM-7AM, please contact night-coverage [email or web address] Password [email or web address]

## 2014-12-14 NOTE — Care Management Note (Signed)
Case Management Note  Patient Details  Name: Darryl Ryan MRN: [id number] Date of Birth: Jun 05, 1923  Subjective/Objective:   [facility] dme rep Lecretia-will bring dme-rw,3n1,shower stool to rm, & w/c will be delivered to home.   [place] aware of d/c.              Action/Plan:d/c home w/HHC/DME.  Expected Discharge Date:                  Expected Discharge Plan:  [place]  In-House Referral:     Discharge planning Services  CM Consult  Post Acute Care Choice:    Choice offered to:  Patient  DME Arranged:  3-N-1, [place] rolling, Shower stool, Wheelchair manual DME Agency:     HH Arranged:  PT, OT HH Agency:  [place]  Status of Service:  Completed, signed off  Medicare Important Message Given:  Yes-third notification given Date Medicare IM Given:    Medicare IM give by:    Date Additional Medicare IM Given:    Additional Medicare Important Message give by:     If discussed at [place] of Stay Meetings, dates discussed:    Additional Comments:  Dessa Phi, RN 12/14/2014, 3:45 PM

## 2014-12-14 NOTE — Progress Notes (Signed)
Got report from Arnold. Agree with nurse's assessment.

## 2014-12-14 NOTE — Progress Notes (Signed)
Attempted to call Dr. Felipa Eth office to obtain follow up appointment.  No answer x2.  Pt spouse educated to call and make follow up appointment.  Pt spouse verbalize understanding. Long, Marissa A

## 2014-12-15 ENCOUNTER — Other Ambulatory Visit: Payer: Self-pay | Admitting: *Deleted

## 2014-12-15 ENCOUNTER — Telehealth: Payer: Self-pay | Admitting: *Deleted

## 2014-12-15 NOTE — Telephone Encounter (Signed)
Informed wife Dr. Alvy Bimler has biopsy results back and opening to see pt tomorrow morning.   Wife would like to bring pt tomorrow.  She will bring him in at 9 am for lab and MD appt at 9:30 am.    Notified Tiffany new pt scheduler to change appt.

## 2014-12-15 NOTE — Patient Outreach (Signed)
[facility]) Care Management  12/15/2014  Darryl Ryan 02/18/1924 [id number]   Assessment: Transition of care call- first attempt Call placed to patient and wife- primary contact person Jeanella Anton) but both were unavailable to speak with according to the lady who answered the phone and identified herself as daughter Judene Companion)-- not listed as emergency contact. Left name and contact information with instruction to call me back once patient or wife is available.   Plan: Will await for a call back. If unable to hear from patient or wife, will schedule for next outreach call.  Lorraine A. Ajel, BSN, RN-BC [place] Management Coordinator Cell: [phone]

## 2014-12-16 ENCOUNTER — Ambulatory Visit: Payer: Medicare Other

## 2014-12-16 ENCOUNTER — Ambulatory Visit (HOSPITAL_BASED_OUTPATIENT_CLINIC_OR_DEPARTMENT_OTHER): Payer: Medicare Other | Admitting: Hematology and Oncology

## 2014-12-16 ENCOUNTER — Other Ambulatory Visit (HOSPITAL_BASED_OUTPATIENT_CLINIC_OR_DEPARTMENT_OTHER): Payer: Medicare Other

## 2014-12-16 ENCOUNTER — Encounter: Payer: Self-pay | Admitting: Hematology and Oncology

## 2014-12-16 ENCOUNTER — Other Ambulatory Visit: Payer: Medicare Other

## 2014-12-16 ENCOUNTER — Telehealth: Payer: Self-pay | Admitting: Hematology and Oncology

## 2014-12-16 ENCOUNTER — Other Ambulatory Visit: Payer: Self-pay | Admitting: *Deleted

## 2014-12-16 DIAGNOSIS — G8929 Other chronic pain: Secondary | ICD-10-CM

## 2014-12-16 DIAGNOSIS — D630 Anemia in neoplastic disease: Secondary | ICD-10-CM

## 2014-12-16 DIAGNOSIS — N184 Chronic kidney disease, stage 4 (severe): Secondary | ICD-10-CM

## 2014-12-16 DIAGNOSIS — D472 Monoclonal gammopathy: Secondary | ICD-10-CM

## 2014-12-16 DIAGNOSIS — M545 Low back pain, unspecified: Secondary | ICD-10-CM

## 2014-12-16 DIAGNOSIS — R040 Epistaxis: Secondary | ICD-10-CM | POA: Diagnosis not present

## 2014-12-16 DIAGNOSIS — C9000 Multiple myeloma not having achieved remission: Secondary | ICD-10-CM | POA: Diagnosis present

## 2014-12-16 DIAGNOSIS — N179 Acute kidney failure, unspecified: Secondary | ICD-10-CM | POA: Diagnosis not present

## 2014-12-16 DIAGNOSIS — N189 Chronic kidney disease, unspecified: Secondary | ICD-10-CM

## 2014-12-16 LAB — COMPREHENSIVE METABOLIC PANEL (CC13)
ALT: 11 U/L (ref 0–55)
ANION GAP: 12 meq/L — AB (ref 3–11)
AST: 20 U/L (ref 5–34)
Albumin: 2.5 g/dL — ABNORMAL LOW (ref 3.5–5.0)
Alkaline Phosphatase: 96 U/L (ref 40–150)
BILIRUBIN TOTAL: 0.34 mg/dL (ref 0.20–1.20)
BUN: 24 mg/dL (ref 7.0–26.0)
CALCIUM: 9.6 mg/dL (ref 8.4–10.4)
CO2: 27 mEq/L (ref 22–29)
CREATININE: 2.9 mg/dL — AB (ref 0.7–1.3)
Chloride: 102 mEq/L (ref 98–109)
EGFR: 21 mL/min/{1.73_m2} — ABNORMAL LOW (ref >90)
Glucose: 124 mg/dl (ref 70–140)
Potassium: 3.8 mEq/L (ref 3.5–5.1)
Sodium: 141 mEq/L (ref 136–145)
TOTAL PROTEIN: 9.2 g/dL — AB (ref 6.4–8.3)

## 2014-12-16 LAB — CBC WITH DIFFERENTIAL/PLATELET
BASO%: 0.4 % (ref 0.0–2.0)
Basophils Absolute: 0 10*3/uL (ref 0.0–0.1)
EOS%: 3.6 % (ref 0.0–7.0)
Eosinophils Absolute: 0.2 10*3/uL (ref 0.0–0.5)
HEMATOCRIT: 27.3 % — AB (ref 38.4–49.9)
HGB: 9.1 g/dL — ABNORMAL LOW (ref 13.0–17.1)
LYMPH#: 0.8 10*3/uL — AB (ref 0.9–3.3)
LYMPH%: 18.3 % (ref 14.0–49.0)
MCH: 33.8 pg — ABNORMAL HIGH (ref 27.2–33.4)
MCHC: 33.4 g/dL (ref 32.0–36.0)
MCV: 101.1 fL — ABNORMAL HIGH (ref 79.3–98.0)
MONO#: 0.6 10*3/uL (ref 0.1–0.9)
MONO%: 12.2 % (ref 0.0–14.0)
NEUT%: 65.5 % (ref 39.0–75.0)
NEUTROS ABS: 3 10*3/uL (ref 1.5–6.5)
PLATELETS: 136 10*3/uL — AB (ref 140–400)
RBC: 2.7 10*6/uL — ABNORMAL LOW (ref 4.20–5.82)
RDW: 17 % — ABNORMAL HIGH (ref 11.0–14.6)
WBC: 4.6 10*3/uL (ref 4.0–10.3)

## 2014-12-16 LAB — HOLD TUBE, BLOOD BANK

## 2014-12-16 MED ORDER — PROCHLORPERAZINE MALEATE 10 MG PO TABS: 10.0000 mg | ORAL_TABLET | Freq: Four times a day (QID) | ORAL | Status: DC | PRN

## 2014-12-16 MED ORDER — DEXAMETHASONE 4 MG PO TABS: 4.0000 mg | ORAL_TABLET | Freq: Every day | ORAL | Status: DC

## 2014-12-16 MED ORDER — ONDANSETRON HCL 8 MG PO TABS: 8.0000 mg | ORAL_TABLET | Freq: Three times a day (TID) | ORAL | Status: DC | PRN

## 2014-12-16 MED ORDER — ACYCLOVIR 400 MG PO TABS: 400.0000 mg | ORAL_TABLET | Freq: Every day | ORAL | Status: DC

## 2014-12-16 NOTE — Patient Outreach (Signed)
[place] [facility]) Care Management  12/16/2014  Darryl Ryan 1923-06-07 [id number]   Assessment: Transition of care call- 2nd attempt Unable to receive a call back from message left yesterday (with name and contact number) to patient's daughter.  Call placed to both patient and wife Darryl Ryan) but unable to reach them. HIPAA compliant message left on patient's home phone number.  Plan: Will await for call back. If unable to hear from patient or wife, will give them a call back.  Lorraine A. Ajel, BSN, RN-BC [place] Management Coordinator Cell: [phone]

## 2014-12-16 NOTE — Telephone Encounter (Signed)
Pt confirmed labs/ov per 09/22 POF, gave pt AVS and Calendar... KJ, scheduled chemo edu.Marland KitchenMarland Kitchen

## 2014-12-16 NOTE — Telephone Encounter (Signed)
Gave and pritned appt sched and avs for pt for Sept and OCT

## 2014-12-16 NOTE — Assessment & Plan Note (Signed)
He is feeling well apart from persistent back pain and recurrent bleeding.  Those are likely related to the newly diagnosed multiple myeloma. I discussed with the patient and family treatment for multiple myeloma.  I would like to start him on daily low-dose dexamethasone until treatment to be started next week. I will start with Velcade, induction treatment for at least a month or so until stabilization of blood work. If his kidney function improved/stabilized over the next few weeks, I will consider adding Revlimid and Zometa in the future. The risks, benefits, side effects of treatment peripherally discussed with the patient and family and they agreed to proceed. I will see him prior to week 2 of therapy to assess side effects of treatment

## 2014-12-17 ENCOUNTER — Other Ambulatory Visit: Payer: Self-pay | Admitting: *Deleted

## 2014-12-17 DIAGNOSIS — G8929 Other chronic pain: Secondary | ICD-10-CM | POA: Insufficient documentation

## 2014-12-17 DIAGNOSIS — M545 Low back pain, unspecified: Secondary | ICD-10-CM | POA: Insufficient documentation

## 2014-12-17 DIAGNOSIS — N184 Chronic kidney disease, stage 4 (severe): Secondary | ICD-10-CM | POA: Insufficient documentation

## 2014-12-17 NOTE — Assessment & Plan Note (Signed)
I suspect he has acute on chronic renal failure, related to multiple myeloma. We will monitor his blood counts carefully. For this reason, I do not want to add Revlimid and Zometa. There is no contraindication for him to receive Velcade with renal failure. I encouraged him to drink plenty of fluids daily

## 2014-12-17 NOTE — Assessment & Plan Note (Signed)
This is likely anemia of chronic disease. The patient denies recent history of bleeding such as epistaxis, hematuria or hematochezia. He is asymptomatic from the anemia. We will observe for now.  He does not require transfusion now.   

## 2014-12-17 NOTE — Assessment & Plan Note (Signed)
I suspect the back pain is from recent fracture and could be exacerbated by recent diagnosis of multiple myeloma. Low-dose dexamethasone daily should help. I will reassess his back pain in the next visit

## 2014-12-17 NOTE — Patient Outreach (Signed)
[facility] [facility]) Care Management  12/17/2014  Darryl Ryan [birth year] [id number]   Assessment: Transition of care call- 3rd attempt Referral received from hospital liaison Lonn [place]) for hypertension and care coordination needs. Call placed to patient and spoke with him. Verified identity. Care management coordinator introduced self and explained the purpose of the call.  Patient states "I am not proficient in determining about needs" and "I am not making decisions about what I can have and what I can't" on the phone". "You can call back later and speak to my wife since I don't make decisions like this because it is confusing to me" as verbalized by patient.  Confirmed with patient his consent to speak to wife about his healthcare needs. Patient further states that his wife just left and he hang up the phone.   Addendum: Call placed to patient's wife and spoke with her briefly. Identity verified. Wife states that she is in the store and running errands at the moment. Wife asked care management coordinator to call her back. Wife agreed to a call next week.    Plan: Will call and speak to patient's wife next week.   Darryl Ryan, BSN, RN-BC [place] Management Coordinator Cell: [phone]

## 2014-12-17 NOTE — Progress Notes (Signed)
[place] OFFICE PROGRESS NOTE  Patient Care Team: Lajean Manes, MD as PCP - General (Internal Medicine) Diamantina Monks, RN as [place] Management  SUMMARY OF ONCOLOGIC HISTORY:   Multiple myeloma   12/08/2014 Imaging Skeletal survey showed old compression fracture without new bone lytic lesion   12/10/2014 Bone Marrow Biopsy [id number] bone marrow biopsy showed 69% plasma cell involvement    INTERVAL HISTORY: Please see below for problem oriented charting. He returns for follow-up, to review results of bone marrow biopsy. Since he was discharged, he had one more episode of epistaxis, resolved spontaneously. He continues a severe lower back pain, he rated it at 10 out of 10 pain, however has not taken oxycodone as prescribed.  REVIEW OF SYSTEMS:   Constitutional: Denies fevers, chills or abnormal weight loss Eyes: Denies blurriness of vision Ears, nose, mouth, throat, and face: Denies mucositis or sore throat Respiratory: Denies cough, dyspnea or wheezes Cardiovascular: Denies palpitation, chest discomfort or lower extremity swelling Gastrointestinal:  Denies nausea, heartburn or change in bowel habits Skin: Denies abnormal skin rashes Lymphatics: Denies new lymphadenopathy or easy bruising Neurological:Denies numbness, tingling or new weaknesses Behavioral/Psych: Mood is stable, no new changes  All other systems were reviewed with the patient and are negative.  I have reviewed the past medical history, past surgical history, social history and family history with the patient and they are unchanged from previous note.  ALLERGIES:  is allergic to bee venom and tramadol.  MEDICATIONS:  Current Outpatient Prescriptions  Medication Sig Dispense Refill  . acyclovir (ZOVIRAX) 400 MG tablet Take 1 tablet (400 mg total) by mouth daily. 60 tablet 3  . amLODipine (NORVASC) 10 MG tablet Take 10 mg by mouth daily.    . Ascorbic Acid (VITAMIN C) 1000  MG tablet Take 1,000 mg by mouth daily.    Marland Kitchen aspirin 325 MG tablet Take 325 mg by mouth daily.    Marland Kitchen dexamethasone (DECADRON) 4 MG tablet Take 1 tablet (4 mg total) by mouth daily. 60 tablet 0  . donepezil (ARICEPT) 10 MG tablet Take 10 mg by mouth at bedtime.    Marland Kitchen doxazosin (CARDURA) 2 MG tablet Take 2 mg by mouth daily.    . hydrALAZINE (APRESOLINE) 10 MG tablet Take 1 tablet (10 mg total) by mouth every 8 (eight) hours. 90 tablet 0  . memantine (NAMENDA) 10 MG tablet Take 10 mg by mouth 2 (two) times daily.    . metoprolol tartrate (LOPRESSOR) 25 MG tablet Take 25 mg by mouth 2 (two) times daily.    . Multiple Vitamin (MULTIVITAMIN WITH MINERALS) TABS tablet Take 1 tablet by mouth daily.    . Omega-3 Fatty Acids (FISH OIL) 1000 MG CAPS Take 1 capsule by mouth daily.    . ondansetron (ZOFRAN) 8 MG tablet Take 1 tablet (8 mg total) by mouth every 8 (eight) hours as needed for nausea. 30 tablet 1  . oxyCODONE (OXY IR/ROXICODONE) 5 MG immediate release tablet Take 1 tablet (5 mg total) by mouth every 4 (four) hours as needed for moderate pain. 30 tablet 0  . prochlorperazine (COMPAZINE) 10 MG tablet Take 1 tablet (10 mg total) by mouth every 6 (six) hours as needed (Nausea or vomiting). 30 tablet 1   No current facility-administered medications for this visit.    PHYSICAL EXAMINATION: ECOG PERFORMANCE STATUS: 2 - Symptomatic, <50% confined to bed GENERAL:alert, no distress and comfortable SKIN: skin color, texture, turgor are normal, no rashes or significant  lesions EYES: normal, Conjunctiva are pink and non-injected, sclera clear Musculoskeletal:no cyanosis of digits and no clubbing  NEURO: alert & oriented x 3 with fluent speech, no focal motor/sensory deficits  LABORATORY DATA:  I have reviewed the data as listed    Component Value Date/Time   NA 141 12/16/2014 0906   NA 140 12/14/2014 0542   K 3.8 12/16/2014 0906   K 3.7 12/14/2014 0542   CL 104 12/14/2014 0542   CO2 27 12/16/2014  0906   CO2 24 12/14/2014 0542   GLUCOSE 124 12/16/2014 0906   GLUCOSE 102* 12/14/2014 0542   BUN 24.0 12/16/2014 0906   BUN 27* 12/14/2014 0542   CREATININE 2.9* 12/16/2014 0906   CREATININE 2.94* 12/14/2014 0542   CALCIUM 9.6 12/16/2014 0906   CALCIUM 9.2 12/14/2014 0542   PROT 9.2* 12/16/2014 0906   PROT 7.8 12/08/2014 1420   ALBUMIN 2.5* 12/16/2014 0906   ALBUMIN 2.3* 12/08/2014 1420   AST 20 12/16/2014 0906   AST 16 12/08/2014 1420   ALT 11 12/16/2014 0906   ALT 9* 12/08/2014 1420   ALKPHOS 96 12/16/2014 0906   ALKPHOS 67 12/08/2014 1420   BILITOT 0.34 12/16/2014 0906   BILITOT 0.5 12/08/2014 1420   GFRNONAA 18* 12/14/2014 0542   GFRAA 20* 12/14/2014 0542    No results found for: SPEP, UPEP  Lab Results  Component Value Date   WBC 4.6 12/16/2014   NEUTROABS 3.0 12/16/2014   HGB 9.1* 12/16/2014   HCT 27.3* 12/16/2014   MCV 101.1* 12/16/2014   PLT 136* 12/16/2014      Chemistry      Component Value Date/Time   NA 141 12/16/2014 0906   NA 140 12/14/2014 0542   K 3.8 12/16/2014 0906   K 3.7 12/14/2014 0542   CL 104 12/14/2014 0542   CO2 27 12/16/2014 0906   CO2 24 12/14/2014 0542   BUN 24.0 12/16/2014 0906   BUN 27* 12/14/2014 0542   CREATININE 2.9* 12/16/2014 0906   CREATININE 2.94* 12/14/2014 0542      Component Value Date/Time   CALCIUM 9.6 12/16/2014 0906   CALCIUM 9.2 12/14/2014 0542   ALKPHOS 96 12/16/2014 0906   ALKPHOS 67 12/08/2014 1420   AST 20 12/16/2014 0906   AST 16 12/08/2014 1420   ALT 11 12/16/2014 0906   ALT 9* 12/08/2014 1420   BILITOT 0.34 12/16/2014 0906   BILITOT 0.5 12/08/2014 1420      ASSESSMENT & PLAN:  Multiple myeloma He is feeling well apart from persistent back pain and recurrent bleeding.  Those are likely related to the newly diagnosed multiple myeloma. I discussed with the patient and family treatment for multiple myeloma.  I would like to start him on daily low-dose dexamethasone until treatment to be started  next week. I will start with Velcade, induction treatment for at least a month or so until stabilization of blood work. If his kidney function improved/stabilized over the next few weeks, I will consider adding Revlimid and Zometa in the future. The risks, benefits, side effects of treatment peripherally discussed with the patient and family and they agreed to proceed. I will see him prior to week 2 of therapy to assess side effects of treatment  Anemia in neoplastic disease This is likely anemia of chronic disease. The patient denies recent history of bleeding such as epistaxis, hematuria or hematochezia. He is asymptomatic from the anemia. We will observe for now.  He does not require transfusion now.  Chronic kidney disease, stage IV (severe) I suspect he has acute on chronic renal failure, related to multiple myeloma. We will monitor his blood counts carefully. For this reason, I do not want to add Revlimid and Zometa. There is no contraindication for him to receive Velcade with renal failure. I encouraged him to drink plenty of fluids daily  Chronic lower back pain I suspect the back pain is from recent fracture and could be exacerbated by recent diagnosis of multiple myeloma. Low-dose dexamethasone daily should help. I will reassess his back pain in the next visit  Epistaxis He has recurrent epistaxis likely related to untreated multiple myeloma. I recommend he hold his aspirin for 3 days. If bleeding stops, I recommend then he restart at half a dose of regular aspirin.   Orders Placed This Encounter  Procedures  . CBC with Differential    Standing Status: Standing     Number of Occurrences: 20     Standing Expiration Date: 12/17/2015  . Comprehensive metabolic panel    Standing Status: Standing     Number of Occurrences: 20     Standing Expiration Date: 12/17/2015   All questions were answered. The patient knows to call the clinic with any problems, questions or  concerns. No barriers to learning was detected. I spent 30 minutes counseling the patient face to face. The total time spent in the appointment was 40 minutes and more than 50% was on counseling and review of test results     [facility], [place], MD 12/17/2014 7:36 AM

## 2014-12-17 NOTE — Assessment & Plan Note (Signed)
He has recurrent epistaxis likely related to untreated multiple myeloma. I recommend he hold his aspirin for 3 days. If bleeding stops, I recommend then he restart at half a dose of regular aspirin.

## 2014-12-20 ENCOUNTER — Other Ambulatory Visit: Payer: Medicare Other

## 2014-12-20 ENCOUNTER — Ambulatory Visit: Payer: Medicare Other | Admitting: Hematology and Oncology

## 2014-12-20 ENCOUNTER — Ambulatory Visit: Payer: Medicare Other

## 2014-12-20 ENCOUNTER — Other Ambulatory Visit: Payer: Self-pay | Admitting: *Deleted

## 2014-12-20 ENCOUNTER — Encounter: Payer: Self-pay | Admitting: *Deleted

## 2014-12-20 LAB — CHROMOSOME ANALYSIS, BONE MARROW

## 2014-12-20 NOTE — Patient Outreach (Signed)
[facility]) Care Management  12/20/2014  Darryl Ryan 03/15/1924 [id number]   Assessment: Transition of care call Referral received from hospital liaison Lonn Georgia) for follow-up of hypertension and care coordination needs. Call placed to patient and wife as scheduled last week. Spoke to patient's wife. Identity verified. Care management coordinator reintroduced self and re- explained the purpose of the call.  Patient's wife reports that [facility] has started working with patient and will have a physical therapy, occupational therapy, nurse and aide who will be providing services to husband. Patient is also going to his appointments to [facility] twice a week for diagnosis of multiple myeloma. Wife verbalized " I don't think we can handle anything anymore at this time". She further states "people coming and going makes [place] confused and frustrated".  She mentions that all services they need are covered and does not need any more home visits. Wife is aware of eligibility to the program and referral from primary care physician, however, declines to participate at the moment. She states "did not fully understand when consent was signed at the hospital" and was apologetic. Care management coordinator expressed understanding and told wife that they can contact [facility] care management, 24-hour nurse line or care management coordinator if further needs arise. Contact numbers provided. Encourage wife to communicate current needs with [facility] home [facility] staff.   Plan: Will close case. Will notify primary care provider of case closure.  Lorraine A. Ajel, BSN, RN-BC [place] Management Coordinator Cell: [phone]

## 2014-12-21 ENCOUNTER — Ambulatory Visit: Payer: Medicare Other

## 2014-12-21 ENCOUNTER — Encounter: Payer: Self-pay | Admitting: *Deleted

## 2014-12-21 ENCOUNTER — Ambulatory Visit (HOSPITAL_BASED_OUTPATIENT_CLINIC_OR_DEPARTMENT_OTHER): Payer: Medicare Other

## 2014-12-21 VITALS — BP 181/86 | HR 72 | Temp 97.8°F | Resp 20

## 2014-12-21 DIAGNOSIS — Z5112 Encounter for antineoplastic immunotherapy: Secondary | ICD-10-CM | POA: Diagnosis present

## 2014-12-21 DIAGNOSIS — C9000 Multiple myeloma not having achieved remission: Secondary | ICD-10-CM

## 2014-12-21 DIAGNOSIS — N179 Acute kidney failure, unspecified: Secondary | ICD-10-CM

## 2014-12-21 MED ORDER — ONDANSETRON HCL 8 MG PO TABS
ORAL_TABLET | ORAL | Status: AC
  Filled 2014-12-21: qty 1

## 2014-12-21 MED ORDER — ACETAMINOPHEN 500 MG PO TABS
ORAL_TABLET | ORAL | Status: AC
  Filled 2014-12-21: qty 2

## 2014-12-21 MED ORDER — ONDANSETRON HCL 8 MG PO TABS
8.0000 mg | ORAL_TABLET | Freq: Once | ORAL | Status: AC
  Administered 2014-12-21: 8 mg via ORAL

## 2014-12-21 MED ORDER — ACETAMINOPHEN 500 MG PO TABS
1000.0000 mg | ORAL_TABLET | Freq: Once | ORAL | Status: AC
Start: 2014-12-21 — End: 2014-12-21
  Administered 2014-12-21: 1000 mg via ORAL

## 2014-12-21 MED ORDER — BORTEZOMIB CHEMO SQ INJECTION 3.5 MG (2.5MG/ML)
1.3000 mg/m2 | Freq: Once | INTRAMUSCULAR | Status: AC
  Administered 2014-12-21: 2.5 mg via SUBCUTANEOUS
  Filled 2014-12-21: qty 2.5

## 2014-12-21 NOTE — Progress Notes (Signed)
Received call in [facility] for pt to be evaluated for pain by Selena Lesser; Per Dr. Alvy Bimler, okay to give Tylenol 1GM 4x/day, to not exceed 4GM/daily, may give one dose here; pt has renal failure and unable to take NSAIDS, also family is requesting no narcotics since they make pt confused. Med list updated; patient and family voice understanding of instruction.

## 2014-12-21 NOTE — Patient Instructions (Signed)
[facility] Discharge Instructions for Patients Receiving Chemotherapy  Today you received the following chemotherapy agents: Velcade.  To help prevent nausea and vomiting after your treatment, we encourage you to take your nausea medication: Compazine 10 mg every 6 hours as needed.   If you develop nausea and vomiting that is not controlled by your nausea medication, call the clinic.   BELOW ARE SYMPTOMS THAT SHOULD BE REPORTED IMMEDIATELY:  *FEVER GREATER THAN 100.5 F  *CHILLS WITH OR WITHOUT FEVER  NAUSEA AND VOMITING THAT IS NOT CONTROLLED WITH YOUR NAUSEA MEDICATION  *UNUSUAL SHORTNESS OF BREATH  *UNUSUAL BRUISING OR BLEEDING  TENDERNESS IN MOUTH AND THROAT WITH OR WITHOUT PRESENCE OF ULCERS  *URINARY PROBLEMS  *BOWEL PROBLEMS  UNUSUAL RASH Items with * indicate a potential emergency and should be followed up as soon as possible.  Feel free to call the clinic you have any questions or concerns. The clinic phone number is [phone].  Please show the CHEMO ALERT CARD at check-in to the Emergency Department and triage nurse.   

## 2014-12-21 NOTE — Progress Notes (Signed)
Pain improved after taking 1000mg  of tylenol, pt reports no pain. Last BP 181/86, per family this is normal for pt.

## 2014-12-22 NOTE — Patient Outreach (Signed)
[facility]) Care Management  12/22/2014  Darryl Ryan February 02, 1924 [id number]   Notification from Dannielle Huh, RN, to close case due to patient refused [place] Management services.  Thanks, Ronnell [place]. [place], [place] Assistant Phone: [phone] Fax: [phone]

## 2014-12-24 ENCOUNTER — Ambulatory Visit (HOSPITAL_BASED_OUTPATIENT_CLINIC_OR_DEPARTMENT_OTHER): Payer: Medicare Other

## 2014-12-24 VITALS — BP 160/77 | HR 67 | Temp 97.6°F | Resp 20

## 2014-12-24 DIAGNOSIS — C9000 Multiple myeloma not having achieved remission: Secondary | ICD-10-CM

## 2014-12-24 DIAGNOSIS — N179 Acute kidney failure, unspecified: Secondary | ICD-10-CM

## 2014-12-24 DIAGNOSIS — Z5112 Encounter for antineoplastic immunotherapy: Secondary | ICD-10-CM

## 2014-12-24 MED ORDER — ONDANSETRON HCL 8 MG PO TABS
ORAL_TABLET | ORAL | Status: AC
  Filled 2014-12-24: qty 1

## 2014-12-24 MED ORDER — ONDANSETRON HCL 8 MG PO TABS
8.0000 mg | ORAL_TABLET | Freq: Once | ORAL | Status: AC
  Administered 2014-12-24: 8 mg via ORAL

## 2014-12-24 MED ORDER — BORTEZOMIB CHEMO SQ INJECTION 3.5 MG (2.5MG/ML)
1.3000 mg/m2 | Freq: Once | INTRAMUSCULAR | Status: AC
  Administered 2014-12-24: 2.5 mg via SUBCUTANEOUS
  Filled 2014-12-24: qty 2.5

## 2014-12-24 NOTE — Patient Instructions (Signed)
[facility] Discharge Instructions for Patients Receiving Chemotherapy  Today you received the following chemotherapy agents: Velcade.  To help prevent nausea and vomiting after your treatment, we encourage you to take your nausea medication: Compazine 10 mg every 6 hours as needed.   If you develop nausea and vomiting that is not controlled by your nausea medication, call the clinic.   BELOW ARE SYMPTOMS THAT SHOULD BE REPORTED IMMEDIATELY:  *FEVER GREATER THAN 100.5 F  *CHILLS WITH OR WITHOUT FEVER  NAUSEA AND VOMITING THAT IS NOT CONTROLLED WITH YOUR NAUSEA MEDICATION  *UNUSUAL SHORTNESS OF BREATH  *UNUSUAL BRUISING OR BLEEDING  TENDERNESS IN MOUTH AND THROAT WITH OR WITHOUT PRESENCE OF ULCERS  *URINARY PROBLEMS  *BOWEL PROBLEMS  UNUSUAL RASH Items with * indicate a potential emergency and should be followed up as soon as possible.  Feel free to call the clinic you have any questions or concerns. The clinic phone number is [phone].  Please show the CHEMO ALERT CARD at check-in to the Emergency Department and triage nurse.   

## 2014-12-28 ENCOUNTER — Ambulatory Visit (HOSPITAL_BASED_OUTPATIENT_CLINIC_OR_DEPARTMENT_OTHER): Payer: Medicare Other | Admitting: Hematology and Oncology

## 2014-12-28 ENCOUNTER — Telehealth: Payer: Self-pay | Admitting: Hematology and Oncology

## 2014-12-28 ENCOUNTER — Encounter: Payer: Self-pay | Admitting: Hematology and Oncology

## 2014-12-28 ENCOUNTER — Ambulatory Visit (HOSPITAL_BASED_OUTPATIENT_CLINIC_OR_DEPARTMENT_OTHER): Payer: Medicare Other

## 2014-12-28 ENCOUNTER — Encounter (HOSPITAL_COMMUNITY): Payer: Self-pay

## 2014-12-28 ENCOUNTER — Telehealth: Payer: Self-pay | Admitting: *Deleted

## 2014-12-28 ENCOUNTER — Other Ambulatory Visit (HOSPITAL_BASED_OUTPATIENT_CLINIC_OR_DEPARTMENT_OTHER): Payer: Medicare Other

## 2014-12-28 VITALS — BP 184/87 | HR 65 | Temp 97.8°F | Resp 20 | Ht 68.0 in | Wt 163.2 lb

## 2014-12-28 DIAGNOSIS — C9000 Multiple myeloma not having achieved remission: Secondary | ICD-10-CM

## 2014-12-28 DIAGNOSIS — G8929 Other chronic pain: Secondary | ICD-10-CM

## 2014-12-28 DIAGNOSIS — M545 Low back pain, unspecified: Secondary | ICD-10-CM

## 2014-12-28 DIAGNOSIS — D630 Anemia in neoplastic disease: Secondary | ICD-10-CM | POA: Diagnosis not present

## 2014-12-28 DIAGNOSIS — N179 Acute kidney failure, unspecified: Secondary | ICD-10-CM

## 2014-12-28 DIAGNOSIS — N184 Chronic kidney disease, stage 4 (severe): Secondary | ICD-10-CM

## 2014-12-28 DIAGNOSIS — I160 Hypertensive urgency: Secondary | ICD-10-CM

## 2014-12-28 DIAGNOSIS — Z5112 Encounter for antineoplastic immunotherapy: Secondary | ICD-10-CM

## 2014-12-28 LAB — COMPREHENSIVE METABOLIC PANEL (CC13)
ALBUMIN: 2.7 g/dL — AB (ref 3.5–5.0)
ALK PHOS: 107 U/L (ref 40–150)
ALT: 17 U/L (ref 0–55)
AST: 16 U/L (ref 5–34)
Anion Gap: 12 mEq/L — ABNORMAL HIGH (ref 3–11)
BUN: 40 mg/dL — AB (ref 7.0–26.0)
CALCIUM: 9.2 mg/dL (ref 8.4–10.4)
CO2: 29 mEq/L (ref 22–29)
Chloride: 99 mEq/L (ref 98–109)
Creatinine: 2 mg/dL — ABNORMAL HIGH (ref 0.7–1.3)
EGFR: 34 mL/min/{1.73_m2} — AB (ref >90)
Glucose: 123 mg/dl (ref 70–140)
POTASSIUM: 4.1 meq/L (ref 3.5–5.1)
Sodium: 140 mEq/L (ref 136–145)
TOTAL PROTEIN: 8.8 g/dL — AB (ref 6.4–8.3)
Total Bilirubin: 0.36 mg/dL (ref 0.20–1.20)

## 2014-12-28 LAB — CBC WITH DIFFERENTIAL/PLATELET
BASO%: 0.2 % (ref 0.0–2.0)
BASOS ABS: 0 10*3/uL (ref 0.0–0.1)
EOS ABS: 0 10*3/uL (ref 0.0–0.5)
EOS%: 0.1 % (ref 0.0–7.0)
HEMATOCRIT: 27.5 % — AB (ref 38.4–49.9)
HEMOGLOBIN: 9.1 g/dL — AB (ref 13.0–17.1)
LYMPH#: 0.7 10*3/uL — AB (ref 0.9–3.3)
LYMPH%: 12.1 % — ABNORMAL LOW (ref 14.0–49.0)
MCH: 33.5 pg — AB (ref 27.2–33.4)
MCHC: 33 g/dL (ref 32.0–36.0)
MCV: 101.6 fL — AB (ref 79.3–98.0)
MONO#: 0.4 10*3/uL (ref 0.1–0.9)
MONO%: 6.7 % (ref 0.0–14.0)
NEUT%: 80.9 % — ABNORMAL HIGH (ref 39.0–75.0)
NEUTROS ABS: 5 10*3/uL (ref 1.5–6.5)
Platelets: 110 10*3/uL — ABNORMAL LOW (ref 140–400)
RBC: 2.7 10*6/uL — ABNORMAL LOW (ref 4.20–5.82)
RDW: 17.1 % — AB (ref 11.0–14.6)
WBC: 6.2 10*3/uL (ref 4.0–10.3)

## 2014-12-28 MED ORDER — ONDANSETRON HCL 8 MG PO TABS
8.0000 mg | ORAL_TABLET | Freq: Once | ORAL | Status: AC
  Administered 2014-12-28: 8 mg via ORAL

## 2014-12-28 MED ORDER — LENALIDOMIDE 2.5 MG PO CAPS
ORAL_CAPSULE | ORAL | Status: DC
Start: 2014-12-28 — End: 2015-01-11

## 2014-12-28 MED ORDER — ONDANSETRON HCL 8 MG PO TABS
ORAL_TABLET | ORAL | Status: AC
  Filled 2014-12-28: qty 1

## 2014-12-28 MED ORDER — BORTEZOMIB CHEMO SQ INJECTION 3.5 MG (2.5MG/ML)
1.3000 mg/m2 | Freq: Once | INTRAMUSCULAR | Status: AC
  Administered 2014-12-28: 2.5 mg via SUBCUTANEOUS
  Filled 2014-12-28: qty 2.5

## 2014-12-28 NOTE — Progress Notes (Signed)
1345: Okay to treat with Creatine of 2.0 per Dr. Alvy Bimler.

## 2014-12-28 NOTE — Assessment & Plan Note (Signed)
This is likely anemia of chronic disease. The patient denies recent history of bleeding such as epistaxis, hematuria or hematochezia. He is asymptomatic from the anemia. We will observe for now.  He does not require transfusion now.   

## 2014-12-28 NOTE — Telephone Encounter (Signed)
Per staff message and POF I have scheduled appts. Advised scheduler of appts. JMW  

## 2014-12-28 NOTE — Progress Notes (Signed)
Reviewed Revlimid packet with patient and wife. Survey questions asked and pt enrolled @ [employer].

## 2014-12-28 NOTE — Progress Notes (Signed)
[place] OFFICE PROGRESS NOTE  Patient Care Team: Lajean Manes, MD as PCP - General (Internal Medicine)  SUMMARY OF ONCOLOGIC HISTORY:   Multiple myeloma ([place])   12/08/2014 Imaging Skeletal survey showed old compression fracture without new bone lytic lesion   12/10/2014 Bone Marrow Biopsy [id number] bone marrow biopsy showed 69% plasma cell involvement   12/21/2014 -  Chemotherapy He was started on dexamethasone and Velcade    INTERVAL HISTORY: Please see below for problem oriented charting. He returns for further follow-up. He continues to have poor appetite. He continues have lower back pain but he declined taking pain medicine. He has very minor side effects of Velcade with just some redness at the injection sites. He has no further nosebleeds. The dose of aspirin was recently reduced to 81 mg daily.  REVIEW OF SYSTEMS:   Constitutional: Denies fevers, chills or abnormal weight loss Eyes: Denies blurriness of vision Ears, nose, mouth, throat, and face: Denies mucositis or sore throat Respiratory: Denies cough, dyspnea or wheezes Cardiovascular: Denies palpitation, chest discomfort or lower extremity swelling Gastrointestinal:  Denies nausea, heartburn or change in bowel habits Skin: Denies abnormal skin rashes Lymphatics: Denies new lymphadenopathy or easy bruising Neurological:Denies numbness, tingling or new weaknesses Behavioral/Psych: Mood is stable, no new changes  All other systems were reviewed with the patient and are negative.  I have reviewed the past medical history, past surgical history, social history and family history with the patient and they are unchanged from previous note.  ALLERGIES:  is allergic to bee venom and tramadol.  MEDICATIONS:  Current Outpatient Prescriptions  Medication Sig Dispense Refill  . acyclovir (ZOVIRAX) 400 MG tablet Take 1 tablet (400 mg total) by mouth daily. 60 tablet 3  . amLODipine (NORVASC) 10 MG tablet Take  10 mg by mouth daily.    . Ascorbic Acid (VITAMIN C) 1000 MG tablet Take 1,000 mg by mouth daily.    Marland Kitchen aspirin 325 MG tablet Take 325 mg by mouth daily.    Marland Kitchen dexamethasone (DECADRON) 4 MG tablet Take 1 tablet (4 mg total) by mouth daily. 60 tablet 0  . donepezil (ARICEPT) 10 MG tablet Take 10 mg by mouth at bedtime.    Marland Kitchen doxazosin (CARDURA) 2 MG tablet Take 2 mg by mouth daily.    . hydrALAZINE (APRESOLINE) 10 MG tablet Take 1 tablet (10 mg total) by mouth every 8 (eight) hours. 90 tablet 0  . memantine (NAMENDA) 10 MG tablet Take 10 mg by mouth 2 (two) times daily.    . metoprolol tartrate (LOPRESSOR) 25 MG tablet Take 25 mg by mouth 2 (two) times daily.    . Multiple Vitamin (MULTIVITAMIN WITH MINERALS) TABS tablet Take 1 tablet by mouth daily.    . Omega-3 Fatty Acids (FISH OIL) 1000 MG CAPS Take 1 capsule by mouth daily.    Marland Kitchen oxyCODONE (OXY IR/ROXICODONE) 5 MG immediate release tablet Take 1 tablet (5 mg total) by mouth every 4 (four) hours as needed for moderate pain. 30 tablet 0  . acetaminophen (TYLENOL) 500 MG tablet Take 1,000 mg by mouth every 6 (six) hours as needed for moderate pain.    Marland Kitchen lenalidomide (REVLIMID) 2.5 MG capsule Take 1 capsule daily for 14 days then rest 7 days 14 capsule 0  . ondansetron (ZOFRAN) 8 MG tablet Take 1 tablet (8 mg total) by mouth every 8 (eight) hours as needed for nausea. (Patient not taking: Reported on 12/28/2014) 30 tablet 1  . prochlorperazine (COMPAZINE) 10  MG tablet Take 1 tablet (10 mg total) by mouth every 6 (six) hours as needed (Nausea or vomiting). (Patient not taking: Reported on 12/28/2014) 30 tablet 1   No current facility-administered medications for this visit.    PHYSICAL EXAMINATION: ECOG PERFORMANCE STATUS: 2 - Symptomatic, <50% confined to bed  Filed Vitals:   12/28/14 1258  BP: 184/87  Pulse:   Temp:   Resp:    Filed Weights   12/28/14 1257  Weight: 163 lb 4 oz (74.05 kg)    GENERAL:alert, no distress and  comfortable SKIN: he has very minor skin rash at prior injection sites. No evidence of cellulitis. EYES: normal, Conjunctiva are pink and non-injected, sclera clear OROPHARYNX:no exudate, no erythema and lips, buccal mucosa, and tongue normal  NECK: supple, thyroid normal size, non-tender, without nodularity LYMPH:  no palpable lymphadenopathy in the cervical, axillary or inguinal LUNGS: clear to auscultation and percussion with normal breathing effort HEART: regular rate & rhythm and no murmurs and no lower extremity edema ABDOMEN:abdomen soft, non-tender and normal bowel sounds Musculoskeletal:no cyanosis of digits and no clubbing  NEURO: alert & oriented x 3 with fluent speech, no focal motor/sensory deficits  LABORATORY DATA:  I have reviewed the data as listed    Component Value Date/Time   NA 140 12/28/2014 1241   NA 140 12/14/2014 0542   K 4.1 12/28/2014 1241   K 3.7 12/14/2014 0542   CL 104 12/14/2014 0542   CO2 29 12/28/2014 1241   CO2 24 12/14/2014 0542   GLUCOSE 123 12/28/2014 1241   GLUCOSE 102* 12/14/2014 0542   BUN 40.0* 12/28/2014 1241   BUN 27* 12/14/2014 0542   CREATININE 2.0* 12/28/2014 1241   CREATININE 2.94* 12/14/2014 0542   CALCIUM 9.2 12/28/2014 1241   CALCIUM 9.2 12/14/2014 0542   PROT 8.8* 12/28/2014 1241   PROT 7.8 12/08/2014 1420   ALBUMIN 2.7* 12/28/2014 1241   ALBUMIN 2.3* 12/08/2014 1420   AST 16 12/28/2014 1241   AST 16 12/08/2014 1420   ALT 17 12/28/2014 1241   ALT 9* 12/08/2014 1420   ALKPHOS 107 12/28/2014 1241   ALKPHOS 67 12/08/2014 1420   BILITOT 0.36 12/28/2014 1241   BILITOT 0.5 12/08/2014 1420   GFRNONAA 18* 12/14/2014 0542   GFRAA 20* 12/14/2014 0542    No results found for: SPEP, UPEP  Lab Results  Component Value Date   WBC 6.2 12/28/2014   NEUTROABS 5.0 12/28/2014   HGB 9.1* 12/28/2014   HCT 27.5* 12/28/2014   MCV 101.6* 12/28/2014   PLT 110* 12/28/2014      Chemistry      Component Value Date/Time   NA 140  12/28/2014 1241   NA 140 12/14/2014 0542   K 4.1 12/28/2014 1241   K 3.7 12/14/2014 0542   CL 104 12/14/2014 0542   CO2 29 12/28/2014 1241   CO2 24 12/14/2014 0542   BUN 40.0* 12/28/2014 1241   BUN 27* 12/14/2014 0542   CREATININE 2.0* 12/28/2014 1241   CREATININE 2.94* 12/14/2014 0542      Component Value Date/Time   CALCIUM 9.2 12/28/2014 1241   CALCIUM 9.2 12/14/2014 0542   ALKPHOS 107 12/28/2014 1241   ALKPHOS 67 12/08/2014 1420   AST 16 12/28/2014 1241   AST 16 12/08/2014 1420   ALT 17 12/28/2014 1241   ALT 9* 12/08/2014 1420   BILITOT 0.36 12/28/2014 1241   BILITOT 0.5 12/08/2014 1420     ASSESSMENT & PLAN:  Multiple myeloma He tolerated  treatment well. Mild pancytopenia and kidney function tests show gradual improvement. I will proceed with treatment today without dose adjustment. For cycle 2, I plan to add on Revlimid, low dose at 2.5 mg daily in addition to dexamethasone and Velcade. The risk, benefit, side effects of Revlimid was discussed with the patient and he agreed to proceed. In the future, I also plan to add on IV Bisphosphonates  Anemia in neoplastic disease This is likely anemia of chronic disease. The patient denies recent history of bleeding such as epistaxis, hematuria or hematochezia. He is asymptomatic from the anemia. We will observe for now.  He does not require transfusion now.     Chronic kidney disease, stage IV (severe) I suspect he has acute on chronic renal failure, related to multiple myeloma. We will monitor his blood counts carefully. For this reason, I do not want to add Revlimid and Zometa now There is no contraindication for him to receive Velcade with renal failure. I encouraged him to drink plenty of fluids daily It is improving   Chronic lower back pain I suspect the back pain is from recent fracture and could be exacerbated by recent diagnosis of multiple myeloma. Low-dose dexamethasone daily should help. He has not taken  any oxycodone. I will reassess his back pain in the next visit There is no indication for kyphoplasty right now.    Hypertensive urgency He continues to have poorly controlled hypertension. According to his wife, his blood pressure at home has improved since addition of hydralazine. he will continue current medical management. I recommend close follow-up with primary care doctor for medication adjustment.    No orders of the defined types were placed in this encounter.   All questions were answered. The patient knows to call the clinic with any problems, questions or concerns. No barriers to learning was detected. I spent 25 minutes counseling the patient face to face. The total time spent in the appointment was 40 minutes and more than 50% was on counseling and review of test results     [facility], [place], MD 12/28/2014 2:58 PM

## 2014-12-28 NOTE — Assessment & Plan Note (Signed)
I suspect the back pain is from recent fracture and could be exacerbated by recent diagnosis of multiple myeloma. Low-dose dexamethasone daily should help. He has not taken any oxycodone. I will reassess his back pain in the next visit There is no indication for kyphoplasty right now.  

## 2014-12-28 NOTE — Assessment & Plan Note (Addendum)
He tolerated treatment well. Mild pancytopenia and kidney function tests show gradual improvement. I will proceed with treatment today without dose adjustment. For cycle 2, I plan to add on Revlimid, low dose at 2.5 mg daily in addition to dexamethasone and Velcade. The risk, benefit, side effects of Revlimid was discussed with the patient and he agreed to proceed. In the future, I also plan to add on IV Bisphosphonates

## 2014-12-28 NOTE — Assessment & Plan Note (Signed)
I suspect he has acute on chronic renal failure, related to multiple myeloma. We will monitor his blood counts carefully. For this reason, I do not want to add Revlimid and Zometa now There is no contraindication for him to receive Velcade with renal failure. I encouraged him to drink plenty of fluids daily It is improving

## 2014-12-28 NOTE — Telephone Encounter (Signed)
Pt confirmed labs/ov per 10/04 POF, gave pt AVs and Calendar.Darryl Ryan, sent msg to add chemo

## 2014-12-28 NOTE — Assessment & Plan Note (Signed)
He continues to have poorly controlled hypertension. According to his wife, his blood pressure at home has improved since addition of hydralazine. he will continue current medical management. I recommend close follow-up with primary care doctor for medication adjustment.

## 2014-12-28 NOTE — Patient Instructions (Signed)
[facility] Discharge Instructions for Patients Receiving Chemotherapy  Today you received the following chemotherapy agents Velcade. To help prevent nausea and vomiting after your treatment, we encourage you to take your nausea medication as directed.  If you develop nausea and vomiting that is not controlled by your nausea medication, call the clinic.   BELOW ARE SYMPTOMS THAT SHOULD BE REPORTED IMMEDIATELY:  *FEVER GREATER THAN 100.5 F  *CHILLS WITH OR WITHOUT FEVER  NAUSEA AND VOMITING THAT IS NOT CONTROLLED WITH YOUR NAUSEA MEDICATION  *UNUSUAL SHORTNESS OF BREATH  *UNUSUAL BRUISING OR BLEEDING  TENDERNESS IN MOUTH AND THROAT WITH OR WITHOUT PRESENCE OF ULCERS  *URINARY PROBLEMS  *BOWEL PROBLEMS  UNUSUAL RASH Items with * indicate a potential emergency and should be followed up as soon as possible.  Feel free to call the clinic you have any questions or concerns. The clinic phone number is [phone].  Please show the CHEMO ALERT CARD at check-in to the Emergency Department and triage nurse.    

## 2014-12-28 NOTE — Progress Notes (Signed)
I faxed req for revlimid to [facility]

## 2014-12-28 NOTE — Telephone Encounter (Signed)
Advised scheduler to move labs 

## 2014-12-31 ENCOUNTER — Ambulatory Visit (HOSPITAL_BASED_OUTPATIENT_CLINIC_OR_DEPARTMENT_OTHER): Payer: Medicare Other

## 2014-12-31 ENCOUNTER — Ambulatory Visit (HOSPITAL_BASED_OUTPATIENT_CLINIC_OR_DEPARTMENT_OTHER): Payer: Medicare Other | Admitting: Nurse Practitioner

## 2014-12-31 ENCOUNTER — Ambulatory Visit: Payer: Medicare Other

## 2014-12-31 VITALS — BP 150/70 | HR 68 | Temp 98.0°F | Resp 16

## 2014-12-31 DIAGNOSIS — R531 Weakness: Secondary | ICD-10-CM

## 2014-12-31 DIAGNOSIS — N179 Acute kidney failure, unspecified: Secondary | ICD-10-CM

## 2014-12-31 DIAGNOSIS — Z5112 Encounter for antineoplastic immunotherapy: Secondary | ICD-10-CM

## 2014-12-31 DIAGNOSIS — C9002 Multiple myeloma in relapse: Secondary | ICD-10-CM

## 2014-12-31 DIAGNOSIS — C9000 Multiple myeloma not having achieved remission: Secondary | ICD-10-CM | POA: Diagnosis present

## 2014-12-31 DIAGNOSIS — N184 Chronic kidney disease, stage 4 (severe): Secondary | ICD-10-CM | POA: Diagnosis not present

## 2014-12-31 LAB — COMPREHENSIVE METABOLIC PANEL (CC13)
ALT: 19 U/L (ref 0–55)
AST: 16 U/L (ref 5–34)
Albumin: 2.7 g/dL — ABNORMAL LOW (ref 3.5–5.0)
Alkaline Phosphatase: 120 U/L (ref 40–150)
Anion Gap: 12 mEq/L — ABNORMAL HIGH (ref 3–11)
BUN: 40.5 mg/dL — AB (ref 7.0–26.0)
CHLORIDE: 101 meq/L (ref 98–109)
CO2: 28 mEq/L (ref 22–29)
Calcium: 9.1 mg/dL (ref 8.4–10.4)
Creatinine: 1.9 mg/dL — ABNORMAL HIGH (ref 0.7–1.3)
EGFR: 36 mL/min/{1.73_m2} — ABNORMAL LOW (ref >90)
GLUCOSE: 160 mg/dL — AB (ref 70–140)
POTASSIUM: 4.4 meq/L (ref 3.5–5.1)
SODIUM: 141 meq/L (ref 136–145)
Total Bilirubin: 0.3 mg/dL (ref 0.20–1.20)
Total Protein: 8.4 g/dL — ABNORMAL HIGH (ref 6.4–8.3)

## 2014-12-31 LAB — CBC WITH DIFFERENTIAL/PLATELET
BASO%: 0.2 % (ref 0.0–2.0)
Basophils Absolute: 0 10*3/uL (ref 0.0–0.1)
EOS%: 0 % (ref 0.0–7.0)
Eosinophils Absolute: 0 10*3/uL (ref 0.0–0.5)
HEMATOCRIT: 28.6 % — AB (ref 38.4–49.9)
HGB: 9.4 g/dL — ABNORMAL LOW (ref 13.0–17.1)
LYMPH#: 0.4 10*3/uL — AB (ref 0.9–3.3)
LYMPH%: 11.1 % — ABNORMAL LOW (ref 14.0–49.0)
MCH: 33.9 pg — AB (ref 27.2–33.4)
MCHC: 33 g/dL (ref 32.0–36.0)
MCV: 102.6 fL — ABNORMAL HIGH (ref 79.3–98.0)
MONO#: 0.1 10*3/uL (ref 0.1–0.9)
MONO%: 4 % (ref 0.0–14.0)
NEUT#: 3.2 10*3/uL (ref 1.5–6.5)
NEUT%: 84.7 % — ABNORMAL HIGH (ref 39.0–75.0)
Platelets: 97 10*3/uL — ABNORMAL LOW (ref 140–400)
RBC: 2.79 10*6/uL — ABNORMAL LOW (ref 4.20–5.82)
RDW: 18.4 % — AB (ref 11.0–14.6)
WBC: 3.8 10*3/uL — AB (ref 4.0–10.3)

## 2014-12-31 MED ORDER — ONDANSETRON HCL 8 MG PO TABS
ORAL_TABLET | ORAL | Status: AC
  Filled 2014-12-31: qty 1

## 2014-12-31 MED ORDER — ONDANSETRON HCL 8 MG PO TABS
8.0000 mg | ORAL_TABLET | Freq: Once | ORAL | Status: AC
  Administered 2014-12-31: 8 mg via ORAL

## 2014-12-31 MED ORDER — BORTEZOMIB CHEMO SQ INJECTION 3.5 MG (2.5MG/ML)
1.3000 mg/m2 | Freq: Once | INTRAMUSCULAR | Status: AC
  Administered 2014-12-31: 2.5 mg via SUBCUTANEOUS
  Filled 2014-12-31: qty 2.5

## 2014-12-31 NOTE — Progress Notes (Signed)
Patient's wife reports he has had changes since Tuesday.  He is a lot weaker and had to use a wheelchair for the first time today.  Also he has experienced several "shaking" episodes.  Selena Lesser NP called and she will see patient.

## 2014-12-31 NOTE — Patient Instructions (Signed)
[facility] Discharge Instructions for Patients Receiving Chemotherapy Per Dr. Alvy Bimler:  Go back to previous blood pressure medicine dose this weekend and call Dr. Felipa Eth on Monday.  Today you received the following chemotherapy agents:Velcade   To help prevent nausea and vomiting after your treatment, we encourage you to take your nausea medication as directed.    If you develop nausea and vomiting that is not controlled by your nausea medication, call the clinic.   BELOW ARE SYMPTOMS THAT SHOULD BE REPORTED IMMEDIATELY:  *FEVER GREATER THAN 100.5 F  *CHILLS WITH OR WITHOUT FEVER  NAUSEA AND VOMITING THAT IS NOT CONTROLLED WITH YOUR NAUSEA MEDICATION  *UNUSUAL SHORTNESS OF BREATH  *UNUSUAL BRUISING OR BLEEDING  TENDERNESS IN MOUTH AND THROAT WITH OR WITHOUT PRESENCE OF ULCERS  *URINARY PROBLEMS  *BOWEL PROBLEMS  UNUSUAL RASH Items with * indicate a potential emergency and should be followed up as soon as possible.  Feel free to call the clinic you have any questions or concerns. The clinic phone number is [phone].  Please show the [facility] at check-in to the Emergency Department and triage nurse.

## 2015-01-02 ENCOUNTER — Encounter: Payer: Self-pay | Admitting: Nurse Practitioner

## 2015-01-02 NOTE — Assessment & Plan Note (Signed)
Patient presented to the [facility] today to receive his next Velcade injection.  Patient reports some increased generalized weakness within the past 24 hours.  He denies any other new symptoms whatsoever.  He denies any recent fevers or chills.  Blood counts obtained today revealed a WBC of 6.2, ANC 5.0, hemoglobin 9.1, and platelet count 110.  Reviewed all findings with Dr. Robie Ridge decision was made to proceed with Velcade injection for today.  Patient is scheduled to return on 01/11/2015 for labs, visit, and his next Velcade injection.

## 2015-01-02 NOTE — Progress Notes (Signed)
SYMPTOM MANAGEMENT CLINIC   HPI: Darryl Ryan 81 y.o. male diagnosed with multiple myeloma.  Currently undergoing Velcade injections.   Patient and his wife both report the patient has been experiencing some increased generalized weakness within the past 24 hours.  Patient denies any nausea, vomiting, diarrhea, or constipation.  He also denies any recent fevers or chills.  Patient states that he just had a visit with his primary care provider Dr. Felipa Eth yesterday; and some of his blood pressure medication was increased.  Of note-patient's blood pressure on last visit was 184/87; blood pressure today is 150/70.  On exam.-Patient appears fatigued and slightly weak; but nontoxic.  Reviewed these findings with Dr. Robie Ridge then advised both patient and his wife that patient's increased generalized weakness is most likely secondary to a significant decrease in his blood pressure due to a medication change within the past 24 hours.  Patient was advised to call/follow-up with his primary care provider first thing in the morning; to further discuss his symptoms and possible readjustment of his blood pressure medications.  Also, patient was advised to call/return directly to the emergency department for any worsening symptoms whatsoever.  HPI  ROS  Past Medical History  Diagnosis Date  . Hypertension   . Dementia   . Hepatitis B   . Dysphasia   . Chronic kidney disease     History reviewed. No pertinent past surgical history.  has BENIGN PROSTATIC HYPERTROPHY, HX OF; Multiple myeloma ([place]); Other pancytopenia ([place]); Dementia; MGUS (monoclonal gammopathy of unknown significance); Acute on chronic renal failure ([place]); Hypokalemia; Hypertensive urgency; Epistaxis; Acute kidney injury ([place]); Acute renal failure syndrome ([place]); Compression fracture; Back pain; Anemia in neoplastic disease; Chronic kidney disease, stage IV (severe) ([place]); Chronic lower back pain; and Weakness on  his problem list.    is allergic to bee venom and tramadol.    Medication List       This list is accurate as of: 12/31/14 11:59 PM.  Always use your most recent med list.               acetaminophen 500 MG tablet  Commonly known as:  TYLENOL  Take 1,000 mg by mouth every 6 (six) hours as needed for moderate pain.     acyclovir 400 MG tablet  Commonly known as:  ZOVIRAX  Take 1 tablet (400 mg total) by mouth daily.     amLODipine 10 MG tablet  Commonly known as:  NORVASC  Take 10 mg by mouth daily.     aspirin 81 MG tablet  Take 81 mg by mouth daily.     dexamethasone 4 MG tablet  Commonly known as:  DECADRON  Take 1 tablet (4 mg total) by mouth daily.     donepezil 10 MG tablet  Commonly known as:  ARICEPT  Take 10 mg by mouth at bedtime.     doxazosin 2 MG tablet  Commonly known as:  CARDURA  Take 2 mg by mouth daily.     Fish Oil 1000 MG Caps  Take 1 capsule by mouth daily.     hydrALAZINE 10 MG tablet  Commonly known as:  APRESOLINE  Take 1 tablet (10 mg total) by mouth every 8 (eight) hours.     lenalidomide 2.5 MG capsule  Commonly known as:  REVLIMID  Take 1 capsule daily for 14 days then rest 7 days     memantine 10 MG tablet  Commonly known as:  NAMENDA  Take 10 mg  by mouth 2 (two) times daily.     metoprolol tartrate 25 MG tablet  Commonly known as:  LOPRESSOR  Take 25 mg by mouth 2 (two) times daily.     multivitamin with minerals Tabs tablet  Take 1 tablet by mouth daily.     ondansetron 8 MG tablet  Commonly known as:  ZOFRAN  Take 1 tablet (8 mg total) by mouth every 8 (eight) hours as needed for nausea.     oxyCODONE 5 MG immediate release tablet  Commonly known as:  Oxy IR/ROXICODONE  Take 1 tablet (5 mg total) by mouth every 4 (four) hours as needed for moderate pain.     prochlorperazine 10 MG tablet  Commonly known as:  COMPAZINE  Take 1 tablet (10 mg total) by mouth every 6 (six) hours as needed (Nausea or vomiting).      vitamin C 1000 MG tablet  Take 1,000 mg by mouth daily.         PHYSICAL EXAMINATION  Oncology Vitals 12/31/2014 12/28/2014 12/24/2014 12/21/2014 12/21/2014 12/21/2014 12/21/2014  Height - 173 cm - - - - -  Weight - 74.05 kg - - - - -  Weight (lbs) - 163 lbs 4 oz - - - - -  BMI (kg/m2) - 24.82 kg/m2 - - - - -  Temp 98 97.8 97.6 - - 97.8 -  Pulse 68 65 67 72 68 66 72  Resp 16 20 20  - 20 20 -  SpO2 100 97 100 - - 100 100  BSA (m2) - 1.89 m2 - - - - -   BP Readings from Last 3 Encounters:  12/31/14 150/70  12/28/14 184/87  12/24/14 160/77    Physical Exam  Constitutional: He is oriented to person, place, and time.  Patient appears fatigued, weak, frail, and chronically ill.  HENT:  Head: Normocephalic and atraumatic.  Eyes: Conjunctivae and EOM are normal. Pupils are equal, round, and reactive to light. Right eye exhibits no discharge. Left eye exhibits no discharge. No scleral icterus.  Neck: Normal range of motion.  Pulmonary/Chest: Effort normal. No respiratory distress.  Musculoskeletal: Normal range of motion. He exhibits no edema or tenderness.  Neurological: He is alert and oriented to person, place, and time.  Skin: Skin is warm and dry.  Psychiatric: Affect normal.  Nursing note and vitals reviewed.   LABORATORY DATA:. Appointment on 12/31/2014  Component Date Value Ref Range Status  . WBC 12/31/2014 3.8* 4.0 - 10.3 10e3/uL Final  . NEUT# 12/31/2014 3.2  1.5 - 6.5 10e3/uL Final  . HGB 12/31/2014 9.4* 13.0 - 17.1 g/dL Final  . HCT 12/31/2014 28.6* 38.4 - 49.9 % Final  . Platelets 12/31/2014 97* 140 - 400 10e3/uL Final  . MCV 12/31/2014 102.6* 79.3 - 98.0 fL Final  . MCH 12/31/2014 33.9* 27.2 - 33.4 pg Final  . MCHC 12/31/2014 33.0  32.0 - 36.0 g/dL Final  . RBC 12/31/2014 2.79* 4.20 - 5.82 10e6/uL Final  . RDW 12/31/2014 18.4* 11.0 - 14.6 % Final  . lymph# 12/31/2014 0.4* 0.9 - 3.3 10e3/uL Final  . MONO# 12/31/2014 0.1  0.1 - 0.9 10e3/uL Final  . Eosinophils  Absolute 12/31/2014 0.0  0.0 - 0.5 10e3/uL Final  . Basophils Absolute 12/31/2014 0.0  0.0 - 0.1 10e3/uL Final  . NEUT% 12/31/2014 84.7* 39.0 - 75.0 % Final  . LYMPH% 12/31/2014 11.1* 14.0 - 49.0 % Final  . MONO% 12/31/2014 4.0  0.0 - 14.0 % Final  . EOS% 12/31/2014  0.0  0.0 - 7.0 % Final  . BASO% 12/31/2014 0.2  0.0 - 2.0 % Final  . Sodium 12/31/2014 141  136 - 145 mEq/L Final  . Potassium 12/31/2014 4.4  3.5 - 5.1 mEq/L Final  . Chloride 12/31/2014 101  98 - 109 mEq/L Final  . CO2 12/31/2014 28  22 - 29 mEq/L Final  . Glucose 12/31/2014 160* 70 - 140 mg/dl Final   Glucose reference range is for nonfasting patients. Fasting glucose reference range is 70- 100.  Marland Kitchen BUN 12/31/2014 40.5* 7.0 - 26.0 mg/dL Final  . Creatinine 12/31/2014 1.9* 0.7 - 1.3 mg/dL Final  . Total Bilirubin 12/31/2014 0.30  0.20 - 1.20 mg/dL Final  . Alkaline Phosphatase 12/31/2014 120  40 - 150 U/L Final  . AST 12/31/2014 16  5 - 34 U/L Final  . ALT 12/31/2014 19  0 - 55 U/L Final  . Total Protein 12/31/2014 8.4* 6.4 - 8.3 g/dL Final  . Albumin 12/31/2014 2.7* 3.5 - 5.0 g/dL Final  . Calcium 12/31/2014 9.1  8.4 - 10.4 mg/dL Final  . Anion Gap 12/31/2014 12* 3 - 11 mEq/L Final  . EGFR 12/31/2014 36* >90 ml/min/1.73 m2 Final   eGFR is calculated using the CKD-EPI Creatinine Equation (2009)     RADIOGRAPHIC STUDIES: No results found.  ASSESSMENT/PLAN:    Multiple myeloma [facility]) Patient presented to the [facility] today to receive his next Velcade injection.  Patient reports some increased generalized weakness within the past 24 hours.  He denies any other new symptoms whatsoever.  He denies any recent fevers or chills.  Blood counts obtained today revealed a WBC of 6.2, ANC 5.0, hemoglobin 9.1, and platelet count 110.  Reviewed all findings with Dr. Robie Ridge decision was made to proceed with Velcade injection for today.  Patient is scheduled to return on 01/11/2015 for labs, visit, and his next Velcade  injection.  Chronic kidney disease, stage IV (severe) (HCC) History of chronic renal insufficiency.  Creatinine is fairly stable at 1.9 today.  Will continue to monitor.  Weakness Patient and his wife both report the patient has been experiencing some increased generalized weakness within the past 24 hours.  Patient denies any nausea, vomiting, diarrhea, or constipation.  He also denies any recent fevers or chills.  Patient states that he just had a visit with his primary care provider Dr. Felipa Eth yesterday; and some of his blood pressure medication was increased.  Of note-patient's blood pressure on last visit was 184/87; blood pressure today is 150/70.  On exam.-Patient appears fatigued and slightly weak; but nontoxic.  Reviewed these findings with Dr. Robie Ridge then advised both patient and his wife that patient's increased generalized weakness is most likely secondary to a significant decrease in his blood pressure due to a medication change within the past 24 hours.  Patient was advised to call/follow-up with his primary care provider first thing in the morning; to further discuss his symptoms and possible readjustment of his blood pressure medications.  Also, patient was advised to call/return directly to the emergency department for any worsening symptoms whatsoever.  Patient stated understanding of all instructions; and was in agreement with this plan of care. The patient knows to call the clinic with any problems, questions or concerns.   Review/collaboration with Dr. Alvy Bimler regarding all aspects of patient's visit today.   Total time spent with patient was 25 minutes;  with greater than 75 percent of that time spent in face to face counseling regarding patient's  symptoms,  and coordination of care and follow up.  Disclaimer:This dictation was prepared with Dragon/digital dictation along with [employer]. Any transcriptional errors that result from this process are  unintentional.  Drue Second, NP 01/02/2015

## 2015-01-02 NOTE — Assessment & Plan Note (Signed)
Patient and his wife both report the patient has been experiencing some increased generalized weakness within the past 24 hours.  Patient denies any nausea, vomiting, diarrhea, or constipation.  He also denies any recent fevers or chills.  Patient states that he just had a visit with his primary care provider Dr. Felipa Eth yesterday; and some of his blood pressure medication was increased.  Of note-patient's blood pressure on last visit was 184/87; blood pressure today is 150/70.  On exam.-Patient appears fatigued and slightly weak; but nontoxic.  Reviewed these findings with Dr. Robie Ridge then advised both patient and his wife that patient's increased generalized weakness is most likely secondary to a significant decrease in his blood pressure due to a medication change within the past 24 hours.  Patient was advised to call/follow-up with his primary care provider first thing in the morning; to further discuss his symptoms and possible readjustment of his blood pressure medications.  Also, patient was advised to call/return directly to the emergency department for any worsening symptoms whatsoever.

## 2015-01-02 NOTE — Assessment & Plan Note (Signed)
History of chronic renal insufficiency.  Creatinine is fairly stable at 1.9 today.  Will continue to monitor.

## 2015-01-03 ENCOUNTER — Encounter: Payer: Self-pay | Admitting: Hematology and Oncology

## 2015-01-03 NOTE — Progress Notes (Signed)
Per [facility] revlimid was delivered 12/30/14 with 0copay

## 2015-01-05 ENCOUNTER — Inpatient Hospital Stay (HOSPITAL_COMMUNITY)
Admission: EM | Admit: 2015-01-05 | Discharge: 2015-01-07 | DRG: 947 | Disposition: A | Discharge status: Home / Self Care | Payer: Medicare Other | Attending: Internal Medicine | Admitting: Internal Medicine

## 2015-01-05 ENCOUNTER — Encounter (HOSPITAL_COMMUNITY): Payer: Self-pay | Admitting: Emergency Medicine

## 2015-01-05 ENCOUNTER — Emergency Department (HOSPITAL_COMMUNITY): Payer: Medicare Other

## 2015-01-05 DIAGNOSIS — D61818 Other pancytopenia: Secondary | ICD-10-CM | POA: Diagnosis present

## 2015-01-05 DIAGNOSIS — G8929 Other chronic pain: Secondary | ICD-10-CM

## 2015-01-05 DIAGNOSIS — C9000 Multiple myeloma not having achieved remission: Secondary | ICD-10-CM | POA: Diagnosis not present

## 2015-01-05 DIAGNOSIS — I129 Hypertensive chronic kidney disease with stage 1 through stage 4 chronic kidney disease, or unspecified chronic kidney disease: Secondary | ICD-10-CM | POA: Diagnosis not present

## 2015-01-05 DIAGNOSIS — Z7952 Long term (current) use of systemic steroids: Secondary | ICD-10-CM

## 2015-01-05 DIAGNOSIS — R4182 Altered mental status, unspecified: Secondary | ICD-10-CM | POA: Diagnosis present

## 2015-01-05 DIAGNOSIS — D6959 Other secondary thrombocytopenia: Secondary | ICD-10-CM | POA: Diagnosis present

## 2015-01-05 DIAGNOSIS — N184 Chronic kidney disease, stage 4 (severe): Secondary | ICD-10-CM | POA: Diagnosis present

## 2015-01-05 DIAGNOSIS — E46 Unspecified protein-calorie malnutrition: Secondary | ICD-10-CM | POA: Insufficient documentation

## 2015-01-05 DIAGNOSIS — R531 Weakness: Principal | ICD-10-CM | POA: Insufficient documentation

## 2015-01-05 DIAGNOSIS — B1910 Unspecified viral hepatitis B without hepatic coma: Secondary | ICD-10-CM | POA: Diagnosis present

## 2015-01-05 DIAGNOSIS — Z79899 Other long term (current) drug therapy: Secondary | ICD-10-CM | POA: Diagnosis not present

## 2015-01-05 DIAGNOSIS — F0390 Unspecified dementia without behavioral disturbance: Secondary | ICD-10-CM | POA: Diagnosis present

## 2015-01-05 DIAGNOSIS — R627 Adult failure to thrive: Secondary | ICD-10-CM | POA: Diagnosis present

## 2015-01-05 DIAGNOSIS — D619 Aplastic anemia, unspecified: Secondary | ICD-10-CM | POA: Insufficient documentation

## 2015-01-05 DIAGNOSIS — I160 Hypertensive urgency: Secondary | ICD-10-CM | POA: Diagnosis present

## 2015-01-05 DIAGNOSIS — M545 Low back pain, unspecified: Secondary | ICD-10-CM | POA: Diagnosis present

## 2015-01-05 DIAGNOSIS — D72819 Decreased white blood cell count, unspecified: Secondary | ICD-10-CM | POA: Diagnosis not present

## 2015-01-05 DIAGNOSIS — Z6823 Body mass index (BMI) 23.0-23.9, adult: Secondary | ICD-10-CM

## 2015-01-05 DIAGNOSIS — D61810 Antineoplastic chemotherapy induced pancytopenia: Secondary | ICD-10-CM | POA: Diagnosis present

## 2015-01-05 DIAGNOSIS — I951 Orthostatic hypotension: Secondary | ICD-10-CM | POA: Insufficient documentation

## 2015-01-05 DIAGNOSIS — Z7982 Long term (current) use of aspirin: Secondary | ICD-10-CM | POA: Diagnosis not present

## 2015-01-05 DIAGNOSIS — T451X5A Adverse effect of antineoplastic and immunosuppressive drugs, initial encounter: Secondary | ICD-10-CM | POA: Diagnosis not present

## 2015-01-05 DIAGNOSIS — R410 Disorientation, unspecified: Secondary | ICD-10-CM

## 2015-01-05 DIAGNOSIS — E440 Moderate protein-calorie malnutrition: Secondary | ICD-10-CM | POA: Diagnosis present

## 2015-01-05 HISTORY — DX: Malignant (primary) neoplasm, unspecified: C80.1

## 2015-01-05 LAB — CBG MONITORING, ED: Glucose-Capillary: 129 mg/dL — ABNORMAL HIGH (ref 65–99)

## 2015-01-05 LAB — URINALYSIS, ROUTINE W REFLEX MICROSCOPIC
Bilirubin Urine: NEGATIVE
Glucose, UA: NEGATIVE mg/dL
Hgb urine dipstick: NEGATIVE
KETONES UR: NEGATIVE mg/dL
LEUKOCYTES UA: NEGATIVE
NITRITE: NEGATIVE
PROTEIN: NEGATIVE mg/dL
Specific Gravity, Urine: 1.015 (ref 1.005–1.030)
Urobilinogen, UA: 0.2 mg/dL (ref 0.0–1.0)
pH: 7.5 (ref 5.0–8.0)

## 2015-01-05 LAB — CBC WITH DIFFERENTIAL/PLATELET
Basophils Absolute: 0 10*3/uL (ref 0.0–0.1)
Basophils Relative: 0 %
Eosinophils Absolute: 0 10*3/uL (ref 0.0–0.7)
Eosinophils Relative: 1 %
HEMATOCRIT: 27.2 % — AB (ref 39.0–52.0)
Hemoglobin: 9.1 g/dL — ABNORMAL LOW (ref 13.0–17.0)
LYMPHS PCT: 21 %
Lymphs Abs: 1.1 10*3/uL (ref 0.7–4.0)
MCH: 33.7 pg (ref 26.0–34.0)
MCHC: 33.5 g/dL (ref 30.0–36.0)
MCV: 100.7 fL — AB (ref 78.0–100.0)
MONO ABS: 0.3 10*3/uL (ref 0.1–1.0)
MONOS PCT: 5 %
NEUTROS ABS: 3.8 10*3/uL (ref 1.7–7.7)
Neutrophils Relative %: 73 %
Platelets: 75 10*3/uL — ABNORMAL LOW (ref 150–400)
RBC: 2.7 MIL/uL — ABNORMAL LOW (ref 4.22–5.81)
RDW: 18 % — ABNORMAL HIGH (ref 11.5–15.5)
WBC: 5.2 10*3/uL (ref 4.0–10.5)

## 2015-01-05 LAB — COMPREHENSIVE METABOLIC PANEL
ALT: 25 U/L (ref 17–63)
AST: 22 U/L (ref 15–41)
Albumin: 2.8 g/dL — ABNORMAL LOW (ref 3.5–5.0)
Alkaline Phosphatase: 109 U/L (ref 38–126)
Anion gap: 11 (ref 5–15)
BUN: 38 mg/dL — ABNORMAL HIGH (ref 6–20)
CO2: 26 mmol/L (ref 22–32)
Calcium: 8.8 mg/dL — ABNORMAL LOW (ref 8.9–10.3)
Chloride: 103 mmol/L (ref 101–111)
Creatinine, Ser: 1.55 mg/dL — ABNORMAL HIGH (ref 0.61–1.24)
GFR calc Af Amer: 44 mL/min — ABNORMAL LOW (ref >60)
GFR calc non Af Amer: 38 mL/min — ABNORMAL LOW (ref >60)
Glucose, Bld: 132 mg/dL — ABNORMAL HIGH (ref 65–99)
Potassium: 3.5 mmol/L (ref 3.5–5.1)
Sodium: 140 mmol/L (ref 135–145)
Total Bilirubin: 0.8 mg/dL (ref 0.3–1.2)
Total Protein: 7.5 g/dL (ref 6.5–8.1)

## 2015-01-05 MED ORDER — DONEPEZIL HCL 10 MG PO TABS
10.0000 mg | ORAL_TABLET | Freq: Every day | ORAL | Status: DC
  Administered 2015-01-05 – 2015-01-06 (×2): 10 mg via ORAL
  Filled 2015-01-05 (×2): qty 1

## 2015-01-05 MED ORDER — METOPROLOL TARTRATE 25 MG PO TABS: 25.0000 mg | ORAL_TABLET | Freq: Two times a day (BID) | ORAL | Status: DC

## 2015-01-05 MED ORDER — ADULT MULTIVITAMIN W/MINERALS CH
1.0000 | ORAL_TABLET | Freq: Every day | ORAL | Status: DC
  Administered 2015-01-05 – 2015-01-07 (×3): 1 via ORAL
  Filled 2015-01-05 (×3): qty 1

## 2015-01-05 MED ORDER — ENSURE ENLIVE PO LIQD
237.0000 mL | Freq: Two times a day (BID) | ORAL | Status: DC
  Administered 2015-01-06 – 2015-01-07 (×3): 237 mL via ORAL

## 2015-01-05 MED ORDER — ASPIRIN EC 81 MG PO TBEC
81.0000 mg | DELAYED_RELEASE_TABLET | Freq: Every day | ORAL | Status: DC
  Administered 2015-01-05 – 2015-01-07 (×3): 81 mg via ORAL
  Filled 2015-01-05 (×3): qty 1

## 2015-01-05 MED ORDER — ONDANSETRON HCL 4 MG PO TABS: 8.0000 mg | ORAL_TABLET | Freq: Three times a day (TID) | ORAL | Status: DC | PRN

## 2015-01-05 MED ORDER — METOPROLOL TARTRATE 25 MG PO TABS
12.5000 mg | ORAL_TABLET | Freq: Two times a day (BID) | ORAL | Status: DC
  Administered 2015-01-05 – 2015-01-07 (×3): 12.5 mg via ORAL
  Filled 2015-01-05 (×3): qty 1

## 2015-01-05 MED ORDER — ENOXAPARIN SODIUM 30 MG/0.3ML ~~LOC~~ SOLN
30.0000 mg | SUBCUTANEOUS | Status: DC
  Administered 2015-01-05: 30 mg via SUBCUTANEOUS
  Filled 2015-01-05: qty 0.3

## 2015-01-05 MED ORDER — ACETAMINOPHEN 500 MG PO TABS: 1000.0000 mg | ORAL_TABLET | Freq: Four times a day (QID) | ORAL | Status: DC | PRN

## 2015-01-05 MED ORDER — SODIUM CHLORIDE 0.9 % IV SOLN
INTRAVENOUS | Status: DC
  Administered 2015-01-05: 19:00:00 via INTRAVENOUS

## 2015-01-05 MED ORDER — DEXAMETHASONE 4 MG PO TABS
4.0000 mg | ORAL_TABLET | Freq: Every day | ORAL | Status: DC
  Administered 2015-01-05 – 2015-01-07 (×3): 4 mg via ORAL
  Filled 2015-01-05 (×3): qty 1

## 2015-01-05 MED ORDER — MEMANTINE HCL 10 MG PO TABS
10.0000 mg | ORAL_TABLET | Freq: Two times a day (BID) | ORAL | Status: DC
  Administered 2015-01-05 – 2015-01-07 (×4): 10 mg via ORAL
  Filled 2015-01-05 (×4): qty 1

## 2015-01-05 MED ORDER — DOXAZOSIN MESYLATE 2 MG PO TABS
2.0000 mg | ORAL_TABLET | Freq: Every day | ORAL | Status: DC
  Administered 2015-01-05 – 2015-01-07 (×3): 2 mg via ORAL
  Filled 2015-01-05 (×3): qty 1

## 2015-01-05 MED ORDER — HYDRALAZINE HCL 10 MG PO TABS
10.0000 mg | ORAL_TABLET | Freq: Three times a day (TID) | ORAL | Status: DC
  Administered 2015-01-06 – 2015-01-07 (×5): 10 mg via ORAL
  Filled 2015-01-05 (×6): qty 1

## 2015-01-05 MED ORDER — ONDANSETRON HCL 4 MG/2ML IJ SOLN: 4.0000 mg | Freq: Four times a day (QID) | INTRAMUSCULAR | Status: DC | PRN

## 2015-01-05 MED ORDER — LORAZEPAM 2 MG/ML IJ SOLN
0.5000 mg | Freq: Two times a day (BID) | INTRAMUSCULAR | Status: DC | PRN
  Administered 2015-01-05: 0.5 mg via INTRAVENOUS
  Filled 2015-01-05: qty 1

## 2015-01-05 MED ORDER — SODIUM CHLORIDE 0.9 % IV SOLN
INTRAVENOUS | Status: DC
  Administered 2015-01-05: 20:00:00 via INTRAVENOUS

## 2015-01-05 MED ORDER — ONDANSETRON HCL 4 MG PO TABS
4.0000 mg | ORAL_TABLET | Freq: Four times a day (QID) | ORAL | Status: DC | PRN
Start: 2015-01-05 — End: 2015-01-07

## 2015-01-05 MED ORDER — HYDRALAZINE HCL 10 MG PO TABS
5.0000 mg | ORAL_TABLET | Freq: Once | ORAL | Status: AC
  Administered 2015-01-05: 5 mg via ORAL
  Filled 2015-01-05 (×2): qty 1

## 2015-01-05 MED ORDER — VITAMIN C 500 MG PO TABS
1000.0000 mg | ORAL_TABLET | Freq: Every day | ORAL | Status: DC
  Administered 2015-01-05 – 2015-01-07 (×3): 1000 mg via ORAL
  Filled 2015-01-05 (×3): qty 2

## 2015-01-05 MED ORDER — SODIUM CHLORIDE 0.9 % IV BOLUS (SEPSIS)
1000.0000 mL | Freq: Once | INTRAVENOUS | Status: AC
  Administered 2015-01-05: 1000 mL via INTRAVENOUS

## 2015-01-05 MED ORDER — LORAZEPAM 2 MG/ML IJ SOLN: 0.5000 mg | Freq: Four times a day (QID) | INTRAMUSCULAR | Status: DC | PRN

## 2015-01-05 MED ORDER — PROCHLORPERAZINE MALEATE 10 MG PO TABS: 10.0000 mg | ORAL_TABLET | Freq: Four times a day (QID) | ORAL | Status: DC | PRN

## 2015-01-05 MED ORDER — ACYCLOVIR 400 MG PO TABS
400.0000 mg | ORAL_TABLET | Freq: Every day | ORAL | Status: DC
  Administered 2015-01-05 – 2015-01-07 (×3): 400 mg via ORAL
  Filled 2015-01-05 (×3): qty 1

## 2015-01-05 MED ORDER — AMLODIPINE BESYLATE 10 MG PO TABS
10.0000 mg | ORAL_TABLET | Freq: Every day | ORAL | Status: DC
  Administered 2015-01-05 – 2015-01-07 (×3): 10 mg via ORAL
  Filled 2015-01-05 (×3): qty 1

## 2015-01-05 NOTE — ED Notes (Signed)
Pt aware of need for urine, has urinal

## 2015-01-05 NOTE — Telephone Encounter (Signed)
Clarise Cruz RN with [facility] called reporting "Mr. Dragan fell twice yesterday.  Wife reports he has increased confusion and increased weakness.  He can't walk to the bedside commode and is incontinent.  Today's vitals: T = 99.9, P = 63,  R = 16, B/P = 210/98.  He is not eating and not able to take pills either.  Too weak for spouse to get in car to come to [facility]." Advised to call EMS to go to [facility] ED.

## 2015-01-05 NOTE — H&P (Signed)
[facility] History and Physical  Darryl Ryan IFO:[id number] DOB: 1924-01-01 DOA: 01/05/2015  Referring physician: ED physician PCP: Mathews Argyle, MD   Chief Complaint: weakness   HPI:  81 yo male with HTN, CKD stage II - III, recently hospitalized from 9/13 - 9/20 for evaluation of acute on chronic renal failure determined to be secondary to dehydration, lisinopril and MM (baseline Cr is ~ 2 - 2.), follows with Dr. Alvy Bimler, currently receiving Velcade injections. Pt now presented to [facility] ED with main concern on one week of progressive weakness and poor oral intake. Patient denies any nausea, vomiting, diarrhea, or constipation. He also denies any recent fevers or chills. His wife reports that BP has been high and difficult to control and he has had medication dose increased on one of his BP meds but she is not sure which one.   In ED, blood work notable for WBC 5.2, Hg 9.1, Plt 75K and Cr 1.55. TRH asked to admit for further evaluation.  ASSESSMENT AND PLAN:  Principal Problem:   Generalized weakness - appears to be multifactorial in etiology and secondary to progressive failure to thrive, poor oral intake - check orthostatic vitals - provide IVF and ask for PT evaluation   Active Problems:   Pancytopenia ([place]) secondary to MM - blood counts overall stable with Plt being slightly lower compared to last admission - WBC is now WNL but please note that pt has been on decadron  - no signs of active bleeding - CBC in AM    Hypertensive urgency - on Norvasc, Hydralazine, metoprolol at home - will continue here and will add Hydralazine as needed     Multiple myeloma ([place]) - will notify Dr. Alvy Bimler of pt's admission    Chronic kidney disease, stage IV (severe) ([place]) - Cr is better compared to recent admission - BMP in AM    Chronic lower back pain - in the setting of multiple lumbar compression fractures - was evaluated by IR on last admission but pt  determined to be non operative candidate  - provide analgesia as needed     DVT prophylaxis - Lovenox SQ   Radiological Exams on Admission: Dg Chest 2 View  01/05/2015  CLINICAL DATA:  Altered mental status, multiple myeloma EXAM: CHEST  2 VIEW COMPARISON:  PA and lateral chest x-ray of April 30, 2013 FINDINGS: The lungs are adequately inflated. There is no focal infiltrate. There is no pleural effusion. The cardiac silhouette is mildly enlarged. The central pulmonary vascularity is more prominent today. The pulmonary interstitial markings are also mildly increased. There is mild tortuosity of the descending thoracic aorta. There is no pleural effusion or pneumothorax. The bony thorax exhibits no acute abnormality. IMPRESSION: Mild interstitial prominence consistent with low-grade interstitial edema or less likely interstitial pneumonia. New mild enlargement of the cardiac silhouette with central pulmonary vascular congestion. Electronically Signed   By: David  Martinique M.D.   On: 01/05/2015 11:01   Ct Head Wo Contrast  01/05/2015  CLINICAL DATA:  Altered mental status.  Multiple myeloma.  Dementia. EXAM: CT HEAD WITHOUT CONTRAST TECHNIQUE: Contiguous axial images were obtained from the base of the skull through the vertex without intravenous contrast. COMPARISON:  10/18/2008 FINDINGS: The brainstem, cerebellum, cerebral peduncles, thalamus, basal ganglia, basilar cisterns, and ventricular system appear within normal limits. Periventricular white matter and corona radiata hypodensities favor chronic ischemic microvascular white matter disease. No intracranial hemorrhage, mass lesion, or acute CVA. IMPRESSION: 1. No acute intracranial findings. 2. Periventricular  white matter and corona radiata hypodensities favor chronic ischemic microvascular white matter disease. 3. No definite calvarial myeloma lesions. Electronically Signed   By: Van Clines M.D.   On: 01/05/2015 14:04     Code Status:  Full Family Communication: Pt and wife at bedside Disposition Plan: Admit for further evaluation    Mart [facility] [phone]   Review of Systems:  Constitutional: Negative for fever, chill. Negative for diaphoresis.  HENT: Negative for hearing loss, ear pain, nosebleeds, congestion, sore throat, neck pain, tinnitus and ear discharge.   Eyes: Negative for blurred vision, double vision, photophobia, pain, discharge and redness.  Respiratory: Negative for cough, hemoptysis, sputum production, shortness of breath, wheezing and stridor.   Cardiovascular: Negative for chest pain, palpitations, orthopnea, claudication and leg swelling.  Gastrointestinal: Negative for nausea, vomiting and abdominal pain.  Genitourinary: Negative for dysuria, urgency, frequency, hematuria and flank pain.  Musculoskeletal: Negative for myalgias.  Skin: Negative for itching and rash.  Neurological: Negative for tingling, tremors, sensory change, speech change, focal weakness Endo/Heme/Allergies: Negative for environmental allergies and polydipsia. Does not bruise/bleed easily.  Psychiatric/Behavioral: Negative for suicidal ideas. The patient is not nervous/anxious.      Past Medical History  Diagnosis Date  . Hypertension   . Dementia   . Hepatitis B   . Dysphasia   . Chronic kidney disease   . Cancer [facility])     History reviewed. No pertinent past surgical history.  Social History:  reports that he has never smoked. He has never used smokeless tobacco. He reports that he does not drink alcohol. His drug history is not on file.  Allergies  Allergen Reactions  . Bee Venom Anaphylaxis  . Oxycodone     Makes patient crazy   . Tramadol Hives   Pt denies family history of HTN, DM  Prior to Admission medications   Medication Sig Start Date End Date Taking? Authorizing Provider  acetaminophen (TYLENOL) 500 MG tablet Take 1,000 mg by mouth every 6 (six) hours as needed for moderate pain.   Yes Historical  Provider, MD  acyclovir (ZOVIRAX) 400 MG tablet Take 1 tablet (400 mg total) by mouth daily. 12/16/14  Yes Heath Lark, MD  amLODipine (NORVASC) 10 MG tablet Take 10 mg by mouth daily.   Yes Historical Provider, MD  Ascorbic Acid (VITAMIN C) 1000 MG tablet Take 1,000 mg by mouth daily.   Yes Historical Provider, MD  aspirin 81 MG tablet Take 81 mg by mouth daily.   Yes Historical Provider, MD  dexamethasone (DECADRON) 4 MG tablet Take 1 tablet (4 mg total) by mouth daily. 12/16/14  Yes Heath Lark, MD  donepezil (ARICEPT) 10 MG tablet Take 10 mg by mouth at bedtime.   Yes Historical Provider, MD  doxazosin (CARDURA) 2 MG tablet Take 2 mg by mouth daily.   Yes Historical Provider, MD  hydrALAZINE (APRESOLINE) 10 MG tablet Take 1 tablet (10 mg total) by mouth every 8 (eight) hours. Patient taking differently: Take 10 mg by mouth 3 (three) times daily.  12/14/14  Yes Theodis Blaze, MD  lenalidomide (REVLIMID) 2.5 MG capsule Take 1 capsule daily for 14 days then rest 7 days 12/28/14  Yes Heath Lark, MD  memantine (NAMENDA) 10 MG tablet Take 10 mg by mouth 2 (two) times daily.   Yes Historical Provider, MD  metoprolol tartrate (LOPRESSOR) 25 MG tablet Take 25 mg by mouth 2 (two) times daily.   Yes Historical Provider, MD  Multiple Vitamin (MULTIVITAMIN WITH  MINERALS) TABS tablet Take 1 tablet by mouth daily.   Yes Historical Provider, MD  Omega-3 Fatty Acids (FISH OIL) 1000 MG CAPS Take 1 capsule by mouth daily.   Yes Historical Provider, MD  ondansetron (ZOFRAN) 8 MG tablet Take 1 tablet (8 mg total) by mouth every 8 (eight) hours as needed for nausea. 12/16/14  Yes Heath Lark, MD  [place]   Yes Historical Provider, MD  prochlorperazine (COMPAZINE) 10 MG tablet Take 1 tablet (10 mg total) by mouth every 6 (six) hours as needed (Nausea or vomiting). 12/16/14  Yes Heath Lark, MD  oxyCODONE (OXY IR/ROXICODONE) 5 MG immediate release tablet Take 1 tablet (5 mg total) by mouth every 4  (four) hours as needed for moderate pain. Patient not taking: Reported on 01/05/2015 12/14/14   Theodis Blaze, MD    Physical Exam: Filed Vitals:   01/05/15 0949 01/05/15 1111 01/05/15 1321  BP: 148/65 154/82 182/78  Pulse: 69 65 66  Temp: 98.7 F (37.1 C) 98.2 F (36.8 C) 98.3 F (36.8 C)  TempSrc: Oral Oral Rectal  Resp: _0 SpO2: 97% 98% 98%    Physical Exam  Constitutional: Appears tired but NAD HENT: Normocephalic. External right and left ear normal. Dry MM Eyes: Conjunctivae and EOM are normal. PERRLA, no scleral icterus.  Neck: Normal ROM. Neck supple. No JVD. No tracheal deviation. No thyromegaly.  CVS: RRR, S1/S2 +, no murmurs, no gallops, no carotid bruit.  Pulmonary: Effort and breath sounds normal, no stridor, diminished breath sounds at bases  Abdominal: Soft. BS +,  no distension, tenderness, rebound or guarding.  Musculoskeletal: Normal range of motion.  Lymphadenopathy: No lymphadenopathy noted, cervical, inguinal. Neuro: Alert. Normal reflexes, muscle tone coordination. No cranial nerve deficit. Skin: Skin is warm and dry. No rash noted. Not diaphoretic. No erythema. No pallor.  Psychiatric: Normal mood and affect  Labs on Admission:  Basic Metabolic Panel:  Recent Labs Lab 12/31/14 1435 01/05/15 1035  NA 141 140  K 4.4 3.5  CL  --  103  CO2 28 26  GLUCOSE 160* 132*  BUN 40.5* 38*  CREATININE 1.9* 1.55*  CALCIUM 9.1 8.8*   Liver Function Tests:  Recent Labs Lab 12/31/14 1435 01/05/15 1035  AST 16 22  ALT 19 25  ALKPHOS 120 109  BILITOT 0.30 0.8  PROT 8.4* 7.5  ALBUMIN 2.7* 2.8*   CBC:  Recent Labs Lab 12/31/14 1435 01/05/15 1035  WBC 3.8* 5.2  NEUTROABS 3.2 3.8  HGB 9.4* 9.1*  HCT 28.6* 27.2*  MCV 102.6* 100.7*  PLT 97* 75*   CBG:  Recent Labs Lab 01/05/15 1006  GLUCAP 129*    EKG: pending    If 7PM-7AM, please contact night-coverage [email or web address] Password [facility] 01/05/2015, 3:43 PM

## 2015-01-05 NOTE — ED Notes (Signed)
Per EMS pt comes from home for some altered mental status.  Pt has multiple myeloma which receiving chemo for, last dose was last Friday.  Per wife pt urinated beside the bed in the floor, not alter as "his normal".  Pt has fracture in vertebrae and c/o lower back pain and lower abd pain.  Per EMS pt feels warm to touch.

## 2015-01-05 NOTE — ED Provider Notes (Signed)
CSN: [id number]     Arrival date & time 01/05/15  0946 History   First MD Initiated Contact with Patient 01/05/15 [phone]     Chief Complaint  Patient presents with  . Altered Mental Status     (Consider location/radiation/quality/duration/timing/severity/associated sxs/prior Treatment) HPI Darryl Ryan is a 81 y.o. male with history of multiple myeloma, last chemotherapy was last Friday comes in for evaluation of altered mental status. Patient is accompanied by wife who contributes to history of present illness. Wife states over the past couple of days patient has been increasingly weak and has "slouched over and collapsed". She reports last night patient got up out of bed and urinated in the floor, changed his underwear pajamas and then got back in the bed. This is not normal for patient. She reports associated low grade temperature at home, but denies any fevers. No chills, nausea or vomiting, abdominal pain, chest pain or shortness of breath, numbness, urinary symptoms, cough, hemoptysis. An early, patient does have diagnosed fractured vertebrae and has chronic low back pain  Past Medical History  Diagnosis Date  . Hypertension   . Dementia   . Hepatitis B   . Dysphasia   . Chronic kidney disease   . Cancer [facility])    History reviewed. No pertinent past surgical history. No family history on file. Social History  Substance Use Topics  . Smoking status: Never Smoker   . Smokeless tobacco: Never Used  . Alcohol Use: No    Review of Systems A 10 point review of systems was completed and was negative except for pertinent positives and negatives as mentioned in the history of present illness     Allergies  Bee venom; Oxycodone; and Tramadol  Home Medications   Prior to Admission medications   Medication Sig Start Date End Date Taking? Authorizing Provider  acetaminophen (TYLENOL) 500 MG tablet Take 1,000 mg by mouth every 6 (six) hours as needed for moderate pain.   Yes  Historical Provider, MD  acyclovir (ZOVIRAX) 400 MG tablet Take 1 tablet (400 mg total) by mouth daily. 12/16/14  Yes Heath Lark, MD  amLODipine (NORVASC) 10 MG tablet Take 10 mg by mouth daily.   Yes Historical Provider, MD  Ascorbic Acid (VITAMIN C) 1000 MG tablet Take 1,000 mg by mouth daily.   Yes Historical Provider, MD  aspirin 81 MG tablet Take 81 mg by mouth daily.   Yes Historical Provider, MD  dexamethasone (DECADRON) 4 MG tablet Take 1 tablet (4 mg total) by mouth daily. 12/16/14  Yes Heath Lark, MD  donepezil (ARICEPT) 10 MG tablet Take 10 mg by mouth at bedtime.   Yes Historical Provider, MD  doxazosin (CARDURA) 2 MG tablet Take 2 mg by mouth daily.   Yes Historical Provider, MD  hydrALAZINE (APRESOLINE) 10 MG tablet Take 1 tablet (10 mg total) by mouth every 8 (eight) hours. Patient taking differently: Take 10 mg by mouth 3 (three) times daily.  12/14/14  Yes Theodis Blaze, MD  lenalidomide (REVLIMID) 2.5 MG capsule Take 1 capsule daily for 14 days then rest 7 days 12/28/14  Yes Heath Lark, MD  memantine (NAMENDA) 10 MG tablet Take 10 mg by mouth 2 (two) times daily.   Yes Historical Provider, MD  metoprolol tartrate (LOPRESSOR) 25 MG tablet Take 25 mg by mouth 2 (two) times daily.   Yes Historical Provider, MD  Multiple Vitamin (MULTIVITAMIN WITH MINERALS) TABS tablet Take 1 tablet by mouth daily.   Yes Historical Provider,  MD  Omega-3 Fatty Acids (FISH OIL) 1000 MG CAPS Take 1 capsule by mouth daily.   Yes Historical Provider, MD  ondansetron (ZOFRAN) 8 MG tablet Take 1 tablet (8 mg total) by mouth every 8 (eight) hours as needed for nausea. 12/16/14  Yes Heath Lark, MD  [place]   Yes Historical Provider, MD  prochlorperazine (COMPAZINE) 10 MG tablet Take 1 tablet (10 mg total) by mouth every 6 (six) hours as needed (Nausea or vomiting). 12/16/14  Yes Heath Lark, MD  oxyCODONE (OXY IR/ROXICODONE) 5 MG immediate release tablet Take 1 tablet (5 mg total) by  mouth every 4 (four) hours as needed for moderate pain. Patient not taking: Reported on 01/05/2015 12/14/14   Theodis Blaze, MD   BP 182/78 mmHg  Pulse 66  Temp(Src) 98.3 F (36.8 C) (Rectal)  Resp 20  SpO2 98% Physical Exam  Constitutional: He is oriented to person, place, and time. He appears well-developed and well-nourished.  Overall well-appearing 81 year old African-American male  HENT:  Head: Normocephalic and atraumatic.  Mouth/Throat: No oropharyngeal exudate.  Mildly dry mucous membranes  Eyes: Conjunctivae are normal. Pupils are equal, round, and reactive to light. Right eye exhibits no discharge. Left eye exhibits no discharge. No scleral icterus.  Neck: Normal range of motion. Neck supple.  Cardiovascular: Normal rate, regular rhythm and normal heart sounds.   Pulmonary/Chest: Effort normal and breath sounds normal. No respiratory distress. He has no wheezes. He has no rales.  Abdominal: Soft. There is no tenderness. There is no rebound and no guarding.  Musculoskeletal: Normal range of motion. He exhibits no edema or tenderness.  Neurological: He is alert and oriented to person, place, and time.  Patient is alert and oriented to person, place, time, president and situation. Cranial Nerves II-XII grossly intact. Motor strength is 5/5 in all 4 extremities. Sensation intact to light touch.  Skin: Skin is warm and dry. No rash noted.  Psychiatric: He has a normal mood and affect.  Nursing note and vitals reviewed.   ED Course  Procedures (including critical care time) Labs Review Labs Reviewed  CBC WITH DIFFERENTIAL/PLATELET - Abnormal; Notable for the following:    RBC 2.70 (*)    Hemoglobin 9.1 (*)    HCT 27.2 (*)    MCV 100.7 (*)    RDW 18.0 (*)    Platelets 75 (*)    All other components within normal limits  COMPREHENSIVE METABOLIC PANEL - Abnormal; Notable for the following:    Glucose, Bld 132 (*)    BUN 38 (*)    Creatinine, Ser 1.55 (*)    Calcium 8.8  (*)    Albumin 2.8 (*)    GFR calc non Af Amer 38 (*)    GFR calc Af Amer 44 (*)    All other components within normal limits  CBG MONITORING, ED - Abnormal; Notable for the following:    Glucose-Capillary 129 (*)    All other components within normal limits  URINALYSIS, ROUTINE W REFLEX MICROSCOPIC (NOT AT [facility])    Imaging Review Dg Chest 2 View  01/05/2015  CLINICAL DATA:  Altered mental status, multiple myeloma EXAM: CHEST  2 VIEW COMPARISON:  PA and lateral chest x-ray of April 30, 2013 FINDINGS: The lungs are adequately inflated. There is no focal infiltrate. There is no pleural effusion. The cardiac silhouette is mildly enlarged. The central pulmonary vascularity is more prominent today. The pulmonary interstitial markings are also mildly increased. There is  mild tortuosity of the descending thoracic aorta. There is no pleural effusion or pneumothorax. The bony thorax exhibits no acute abnormality. IMPRESSION: Mild interstitial prominence consistent with low-grade interstitial edema or less likely interstitial pneumonia. New mild enlargement of the cardiac silhouette with central pulmonary vascular congestion. Electronically Signed   By: David  Martinique M.D.   On: 01/05/2015 11:01   Ct Head Wo Contrast  01/05/2015  CLINICAL DATA:  Altered mental status.  Multiple myeloma.  Dementia. EXAM: CT HEAD WITHOUT CONTRAST TECHNIQUE: Contiguous axial images were obtained from the base of the skull through the vertex without intravenous contrast. COMPARISON:  10/18/2008 FINDINGS: The brainstem, cerebellum, cerebral peduncles, thalamus, basal ganglia, basilar cisterns, and ventricular system appear within normal limits. Periventricular white matter and corona radiata hypodensities favor chronic ischemic microvascular white matter disease. No intracranial hemorrhage, mass lesion, or acute CVA. IMPRESSION: 1. No acute intracranial findings. 2. Periventricular white matter and corona radiata hypodensities  favor chronic ischemic microvascular white matter disease. 3. No definite calvarial myeloma lesions. Electronically Signed   By: Van Clines M.D.   On: 01/05/2015 14:04   I have personally reviewed and evaluated these images and lab results as part of my medical decision-making.   EKG Interpretation None      ED ECG REPORT   Date: 01/06/2015  Rate: 72  Rhythm: normal sinus rhythm  QRS Axis: normal  Intervals: normal  ST/T Wave abnormalities: normal  Conduction Disutrbances:none  Narrative Interpretation:   Old EKG Reviewed: none available  I have personally reviewed the EKG tracing and agree with the computerized printout as noted.  Meds given in ED:  Medications  sodium chloride 0.9 % bolus 1,000 mL (0 mLs Intravenous Stopped 01/05/15 1152)    New Prescriptions   No medications on file   Filed Vitals:   01/05/15 0949 01/05/15 1111 01/05/15 1321  BP: 148/65 154/82 182/78  Pulse: 69 65 66  Temp: 98.7 F (37.1 C) 98.2 F (36.8 C) 98.3 F (36.8 C)  TempSrc: Oral Oral Rectal  Resp: 16 20 20   SpO2: 97% 98% 98%    MDM  MAAHIR HORST is a 81 y.o. male history of multiple myeloma comes in for evaluation of altered mental status. Per wife at the bedside, patient has been increasingly confused over the past 2 days. On arrival, patient is hemodynamically stable. However, patient is markedly orthostatic. Labs are otherwise unremarkable. Chest x-ray without acute cardio pulmonary pathology. CT only shows chronic changes with no acute intracranial abnormalities. At this time, believe patient would benefit from medical admission for market orthostasis, subsequent rehydration and further evaluation of confusion. Discussed and reviewed this case with my attending, Dr. Mingo Amber who also saw and evaluated the patient and agrees with plan for medical admission. Discussed with Dr. Olen Pel, patient admitted to hospital. Final diagnoses:  Confusion  Weakness  Orthostasis         Comer Locket, PA-C 01/06/15 2707  Evelina Bucy, MD 01/06/15 [phone]

## 2015-01-05 NOTE — ED Notes (Signed)
Bed: UQ34 Expected date:  Expected time:  Means of arrival:  Comments: EMS- 81yo M, [place] Pt, fever/confusion/recent chemo

## 2015-01-06 DIAGNOSIS — N184 Chronic kidney disease, stage 4 (severe): Secondary | ICD-10-CM | POA: Diagnosis not present

## 2015-01-06 DIAGNOSIS — I160 Hypertensive urgency: Secondary | ICD-10-CM

## 2015-01-06 DIAGNOSIS — D619 Aplastic anemia, unspecified: Secondary | ICD-10-CM | POA: Insufficient documentation

## 2015-01-06 DIAGNOSIS — C9000 Multiple myeloma not having achieved remission: Secondary | ICD-10-CM

## 2015-01-06 DIAGNOSIS — E46 Unspecified protein-calorie malnutrition: Secondary | ICD-10-CM | POA: Insufficient documentation

## 2015-01-06 DIAGNOSIS — N183 Chronic kidney disease, stage 3 (moderate): Secondary | ICD-10-CM

## 2015-01-06 DIAGNOSIS — R4182 Altered mental status, unspecified: Secondary | ICD-10-CM | POA: Diagnosis not present

## 2015-01-06 DIAGNOSIS — D61818 Other pancytopenia: Secondary | ICD-10-CM

## 2015-01-06 DIAGNOSIS — R410 Disorientation, unspecified: Secondary | ICD-10-CM

## 2015-01-06 DIAGNOSIS — R531 Weakness: Secondary | ICD-10-CM | POA: Insufficient documentation

## 2015-01-06 DIAGNOSIS — D649 Anemia, unspecified: Secondary | ICD-10-CM

## 2015-01-06 DIAGNOSIS — E440 Moderate protein-calorie malnutrition: Secondary | ICD-10-CM

## 2015-01-06 DIAGNOSIS — I951 Orthostatic hypotension: Secondary | ICD-10-CM

## 2015-01-06 DIAGNOSIS — M545 Low back pain: Secondary | ICD-10-CM

## 2015-01-06 LAB — BASIC METABOLIC PANEL
ANION GAP: 7 (ref 5–15)
BUN: 33 mg/dL — ABNORMAL HIGH (ref 6–20)
CHLORIDE: 105 mmol/L (ref 101–111)
CO2: 27 mmol/L (ref 22–32)
Calcium: 8.2 mg/dL — ABNORMAL LOW (ref 8.9–10.3)
Creatinine, Ser: 1.37 mg/dL — ABNORMAL HIGH (ref 0.61–1.24)
GFR, EST AFRICAN AMERICAN: 51 mL/min — AB (ref >60)
GFR, EST NON AFRICAN AMERICAN: 44 mL/min — AB (ref >60)
Glucose, Bld: 147 mg/dL — ABNORMAL HIGH (ref 65–99)
POTASSIUM: 3.9 mmol/L (ref 3.5–5.1)
Sodium: 139 mmol/L (ref 135–145)

## 2015-01-06 LAB — CBC
HEMATOCRIT: 26.2 % — AB (ref 39.0–52.0)
HEMOGLOBIN: 8.7 g/dL — AB (ref 13.0–17.0)
MCH: 33.7 pg (ref 26.0–34.0)
MCHC: 33.2 g/dL (ref 30.0–36.0)
MCV: 101.6 fL — AB (ref 78.0–100.0)
Platelets: 82 10*3/uL — ABNORMAL LOW (ref 150–400)
RBC: 2.58 MIL/uL — AB (ref 4.22–5.81)
RDW: 18.5 % — ABNORMAL HIGH (ref 11.5–15.5)
WBC: 3.3 10*3/uL — AB (ref 4.0–10.5)

## 2015-01-06 MED ORDER — ENOXAPARIN SODIUM 40 MG/0.4ML ~~LOC~~ SOLN
40.0000 mg | SUBCUTANEOUS | Status: DC
  Administered 2015-01-06: 40 mg via SUBCUTANEOUS
  Filled 2015-01-06: qty 0.4

## 2015-01-06 MED ORDER — HALOPERIDOL LACTATE 5 MG/ML IJ SOLN
1.0000 mg | Freq: Once | INTRAMUSCULAR | Status: AC
  Administered 2015-01-06: 1 mg via INTRAVENOUS

## 2015-01-06 MED ORDER — BOOST PLUS PO LIQD
237.0000 mL | Freq: Two times a day (BID) | ORAL | Status: DC
  Administered 2015-01-06 – 2015-01-07 (×2): 237 mL via ORAL
  Filled 2015-01-06 (×3): qty 237

## 2015-01-06 MED ORDER — HALOPERIDOL LACTATE 5 MG/ML IJ SOLN
1.0000 mg | Freq: Once | INTRAMUSCULAR | Status: AC
  Administered 2015-01-06: 1 mg via INTRAVENOUS
  Filled 2015-01-06: qty 1

## 2015-01-06 MED ORDER — HALOPERIDOL LACTATE 5 MG/ML IJ SOLN
INTRAMUSCULAR | Status: AC
  Filled 2015-01-06: qty 1

## 2015-01-06 NOTE — Progress Notes (Signed)
Initial Nutrition Assessment  DOCUMENTATION CODES:   Non-severe (moderate) malnutrition in context of acute illness/injury  INTERVENTION:  - Will order Boost Plus BID, each supplement provides 360 kcal and 14 grams of protein - RD will continue to monitor for needs  NUTRITION DIAGNOSIS:   Increased nutrient needs related to catabolic illness, cancer and cancer related treatments as evidenced by estimated needs.  GOAL:   Patient will meet greater than or equal to 90% of their needs  MONITOR:   PO intake, Supplement acceptance, Weight trends, Labs, I & O's  REASON FOR ASSESSMENT:   Malnutrition Screening Tool  ASSESSMENT:   81 yo male with HTN, CKD stage II - III, recently hospitalized from 9/13 - 9/20 for evaluation of acute on chronic renal failure determined to be secondary to dehydration, lisinopril and MM (baseline Cr is ~ 2 - 2.), follows with Dr. Alvy Bimler, currently receiving Velcade injections. Pt now presented to [facility] ED with main concern on one week of progressive weakness and poor oral intake. Patient denies any nausea, vomiting, diarrhea, or constipation. He also denies any recent fevers or chills. His wife reports that BP has been high and difficult to control and he has had medication dose increased on one of his BP meds but she is not sure which one.   Pt seen for MST. BMI indicates normal weight status. Pt with dementia, per wife, and unable to provide information other than to state he is not experiencing nausea or abdominal pain at this time. Wife, who is at bedside provides all information. She states that pt consumed 100% of breakfast this AM which consisted of scrambled eggs, orange juice, and prune juice. She states that over the past 1 year appetite has gradually declined with further decline the last 3-4 months.   She states that over the past 2 months pt has lost 10-15 lbs. Per chart review, pt has lost 8 lbs (5% body weight) in the past 1 month which is  significant for time frame. Wife denies pt having taste alteration since initiation of chemotherapy. She states that pt was drinking Boost Plus at home and she is interested in him receiving it during hospitalization. Moderate muscle wasting to lower body, no fat wasting.   Likely not meeting needs. Medications reviewed. Labs reviewed; BUN/creatinine elevated and trending up, Ca: 8.2 mg/dL, GFR: 51.   Diet Order:  Diet regular Room service appropriate?: Yes; Fluid consistency:: Thin  Skin:  Reviewed, no issues  Last BM:  10/11  Height:   Ht Readings from Last 1 Encounters:  01/05/15 5\' 7"  (1.702 m)    Weight:   Wt Readings from Last 1 Encounters:  01/05/15 149 lb 14.6 oz (68 kg)    Ideal Body Weight:  67.27 kg (kg)  BMI:  Body mass index is 23.47 kg/(m^2).  Estimated Nutritional Needs:   Kcal:  2000-2200  Protein:  70-80 grams  Fluid:  2.2 L/day  EDUCATION NEEDS:   No education needs identified at this time     Jarome Matin, RD, LDN Inpatient Clinical Dietitian Pager # [phone] After hours/weekend pager # [phone]

## 2015-01-06 NOTE — Progress Notes (Signed)
Patient extremely agitated and combative. Security called. NP on call notified. New orders placed. Restraint applied. Wife at bedside and updated on patient's care. Will update day nurse on POC

## 2015-01-06 NOTE — Consult Note (Signed)
[place]  [phone]   Requesting Provider: [facility]  Consulting Provider: Heath Lark, MD  Primary Oncologist: Heath Lark, MD  Patient Care Team: Lajean Manes, MD as PCP - General (Internal Medicine)  HOSPITAL CONSULTATION  NOTE I have seen the patient, examined him and edited the notes as follows  HPI: Mr. Darryl Ryan is a 81 year old man with a history of Multiple Myeloma on Dexamethasone and Velcade, admitted on 10/12 with 1 week history of progressive weakness and poor oral intake, with moderate weight loss of unknown amount, per wife's report. He had intermittent periods of agitation as well. At the time of visit, the patient is alert, and able to communicate. Denies fevers, chills, night sweats, vision changes, or mucositis. Denies any respiratory complaints. Denies any chest pain or palpitations. Denies lower extremity swelling. Denies nausea, heartburn or change in bowel habits.  Denies any dysuria. Denies abnormal skin rashes, or neuropathy. Denies any bleeding issues such as epistaxis, hematemesis, hematuria or hematochezia. He has chronic left lower back pain. He has not been able to ambulate, due to significant debility, causing him to have gait instability. No falls have been reported. Of note, his CT head is negative for acute intracranial findings.    Multiple myeloma ([place])   12/08/2014 Imaging Skeletal survey showed old compression fracture without new bone lytic lesion   12/10/2014 Bone Marrow Biopsy [id number] bone marrow biopsy showed 69% plasma cell involvement   12/21/2014   Chemotherapy He was started Cycle 1 on dexamethasone and Velcade (last dose on 10/7)   01/05/15 Hospitalization He was hospitalized for generalized weakness and poor oral intake         Past Medical History  Diagnosis Date  . Hypertension   . Dementia   . Hepatitis B   . Dysphasia   . Chronic kidney disease   . Cancer [facility])       MEDICATIONS:  Scheduled Meds: . acyclovir  400 mg Oral Daily  . amLODipine  10 mg Oral Daily  . aspirin EC  81 mg Oral Daily  . dexamethasone  4 mg Oral Daily  . donepezil  10 mg Oral QHS  . doxazosin  2 mg Oral Daily  . enoxaparin (LOVENOX) injection  30 mg Subcutaneous Q24H  . feeding supplement (ENSURE ENLIVE)  237 mL Oral BID BM  . haloperidol lactate      . hydrALAZINE  10 mg Oral 3 times per day  . memantine  10 mg Oral BID  . metoprolol tartrate  12.5 mg Oral BID  . multivitamin with minerals  1 tablet Oral Daily  . vitamin C  1,000 mg Oral Daily   Continuous Infusions:  PRN Meds:.acetaminophen, LORazepam, ondansetron **OR** ondansetron (ZOFRAN) IV, ondansetron, prochlorperazine  ALLERGIES:  Allergies  Allergen Reactions  . Bee Venom Anaphylaxis  . Oxycodone     Makes patient crazy   . Tramadol Hives    Review of systems:   Constitutional: Denies fevers, chills or abnormal night sweats. He has generalized weakness Eyes: Denies blurriness of vision, double vision or watery eyes Ears, nose, mouth, throat, and face: Denies mucositis or sore throat Respiratory: Denies cough, dyspnea or wheezes Cardiovascular: Denies palpitation, chest discomfort or lower extremity swelling Gastrointestinal:  Denies nausea, heartburn or change in bowel habits. He has decreased oral intake Skin: Denies abnormal skin rashes Lymphatics: Denies new lymphadenopathy or easy bruising Neurological:Denies numbness, tingling or new weaknesses.  Musculoskeletal: he has chronic left lower back pain  Behavioral/Psych: Mood is stable. He had periods of agitation and confusion. All other systems were reviewed with the patient and are negative.    Social History   Social History  . Marital Status: Married    Spouse Name: N/A  . Number of Children: N/A  . Years of Education: N/A   Occupational History  . Not on file.   Social History Main Topics  . Smoking status: Never Smoker   .  Smokeless tobacco: Never Used  . Alcohol Use: No  . Drug Use: Not on file  . Sexual Activity: No   Other Topics Concern  . Not on file   Social History Narrative     PHYSICAL EXAMINATION:  Filed Vitals:   01/06/15 0635  BP: 169/87  Pulse:   Temp: 99.2 F (37.3 C)  Resp: 18      Intake/Output Summary (Last 24 hours) at 01/06/15 0803 Last data filed at 01/06/15 0739  Gross per 24 hour  Intake    240 ml  Output    250 ml  Net    -10 ml     GENERAL:alert, no distress and comfortable SKIN: skin color, texture, turgor are normal, no rashes or significant lesions EYES: normal, conjunctiva are pink and non-injected, sclera clear. Arcus senilis noted bilaterally OROPHARYNX:no exudate, no erythema and lips, buccal mucosa, and tongue normal  NECK: supple, thyroid normal size, non-tender, without nodularity LYMPH:  no palpable lymphadenopathy in the cervical, axillary or inguinal LUNGS: clear to auscultation and percussion with normal breathing effort HEART: regular rate & rhythm and no murmurs and no lower extremity edema ABDOMEN: soft, non-tender and normal bowel sounds Musculoskeletal:no cyanosis of digits and no clubbing.   PSYCH: alert & oriented x 1 with fluent speech NEURO: no focal motor/sensory deficits, follows simple commands  LABORATORY/RADIOLOGY DATA:   Recent Labs Lab 12/31/14 1435 01/05/15 1035 01/06/15 0532  WBC 3.8* 5.2 3.3*  HGB 9.4* 9.1* 8.7*  HCT 28.6* 27.2* 26.2*  PLT 97* 75* 82*  MCV 102.6* 100.7* 101.6*  MCH 33.9* 33.7 33.7  MCHC 33.0 33.5 33.2  RDW 18.4* 18.0* 18.5*  LYMPHSABS 0.4* 1.1  --   MONOABS 0.1 0.3  --   EOSABS 0.0 0.0  --   BASOSABS 0.0 0.0  --     CMP    Recent Labs Lab 12/31/14 1435 01/05/15 1035 01/06/15 0532  NA 141 140 139  K 4.4 3.5 3.9  CL  --  103 105  CO2 28 26 27   GLUCOSE 160* 132* 147*  BUN 40.5* 38* 33*  CREATININE 1.9* 1.55* 1.37*  CALCIUM 9.1 8.8* 8.2*  AST 16 22  --   ALT 19 25  --   ALKPHOS 120  109  --   BILITOT 0.30 0.8  --         Component Value Date/Time   BILITOT 0.8 01/05/2015 1035   BILITOT 0.30 12/31/2014 1435   BILIDIR <0.1* 12/08/2014 1420   IBILI NOT CALCULATED 12/08/2014 1420     Urinalysis    Component Value Date/Time   COLORURINE YELLOW 01/05/2015 1109   APPEARANCEUR CLEAR 01/05/2015 1109   LABSPEC 1.015 01/05/2015 1109   PHURINE 7.5 01/05/2015 [place] 01/05/2015 [place] 01/05/2015 [place] 01/05/2015 1109   KETONESUR NEGATIVE 01/05/2015 1109   PROTEINUR NEGATIVE 01/05/2015 1109   UROBILINOGEN 0.2 01/05/2015 1109   NITRITE NEGATIVE 01/05/2015 1109   LEUKOCYTESUR NEGATIVE 01/05/2015 1109  Recent Labs Lab 12/31/14 1435 01/05/15 1035  AST 16 22  ALT 19 25  ALKPHOS 120 109  BILITOT 0.30 0.8  PROT 8.4* 7.5  ALBUMIN 2.7* 2.8*    CBG:  Recent Labs Lab 01/05/15 1006  GLUCAP 129*    Radiology Studies:  Dg Chest 2 View  01/05/2015  CLINICAL DATA:  Altered mental status, multiple myeloma EXAM: CHEST  2 VIEW COMPARISON:  PA and lateral chest x-ray of April 30, 2013 FINDINGS: The lungs are adequately inflated. There is no focal infiltrate. There is no pleural effusion. The cardiac silhouette is mildly enlarged. The central pulmonary vascularity is more prominent today. The pulmonary interstitial markings are also mildly increased. There is mild tortuosity of the descending thoracic aorta. There is no pleural effusion or pneumothorax. The bony thorax exhibits no acute abnormality. IMPRESSION: Mild interstitial prominence consistent with low-grade interstitial edema or less likely interstitial pneumonia. New mild enlargement of the cardiac silhouette with central pulmonary vascular congestion. Electronically Signed   By: David  Martinique M.D.   On: 01/05/2015 11:01   Ct Head Wo Contrast  01/05/2015  CLINICAL DATA:  Altered mental status.  Multiple myeloma.  Dementia. EXAM: CT HEAD WITHOUT CONTRAST  TECHNIQUE: Contiguous axial images were obtained from the base of the skull through the vertex without intravenous contrast. COMPARISON:  10/18/2008 FINDINGS: The brainstem, cerebellum, cerebral peduncles, thalamus, basal ganglia, basilar cisterns, and ventricular system appear within normal limits. Periventricular white matter and corona radiata hypodensities favor chronic ischemic microvascular white matter disease. No intracranial hemorrhage, mass lesion, or acute CVA. IMPRESSION: 1. No acute intracranial findings. 2. Periventricular white matter and corona radiata hypodensities favor chronic ischemic microvascular white matter disease. 3. No definite calvarial myeloma lesions. Electronically Signed   By: Van Clines M.D.   On: 01/05/2015 14:04   ASSESSMENT AND PLAN:  Multiple Myeloma Patient was initiated on cycle 1 chemotherapy with Dexamethasone and Velcade on 9/27, last dose of Velade on 10/7 Next infusion is scheduled for 10/18 Clinically, he is responding very well to treatment with almost complete normalization of his serum creatinine. We'll defer treatment to outpatient  Generalized weakness  He was admitted on 10/12 with 1 week history of progressive weakness and poor oral intake, with moderate weight loss  This appears to be multifactorial, in the setting of deconditioning and recent chemo  Appreciate PT involvement Consider Nutrition evaluation I suspect the most likely cause is due to significant fluctuation of his blood pressure  Anemia Due to recent chemotherapy, malnutrition, No transfusion is indicated at this time Monitor counts closely Transfuse blood to maintain a Hb of 8 g or if the patient is acutely bleeding  Thrombocytopenia This is due to malignancy, dilution, recent chemo.  No bleeding issues reported at this time Monitor counts closely No transfusion is indicated at this time Transfuse 1 unit of platelets if count is less or equal than 10,000 or 20,000  if the patient is acutely bleeding Hold Lovenox if  platelets drop to less than 50,000  Leukopenia Due to recent chemotherapy Will consider Granix if ANC is less than 1.0 Continue to closely monitor  Malnutrition Consider Nutrition evaluation  Chronic Kidney Failure, improving He has excellent response to Velcade with near normalization of his kidney function Continue close observation  Lower back pain In the setting of multiple lumbar compression fractures He is not IR operative candidate This is well controlled with current analgesic regimen  Hypertensive urgency He has significant fluctuation of his blood pressure over  the last few months. I suspect a huge portion of his weakness is due to improvement of his kidney function. I will defer to primary service for management  DVT prophylaxis On Lovenox Hold Lovenox if  platelets drop to less than 50,000  Full Code Other medical issues as per admitting team I will sign off. He has appointment to see me next week.    Rondel Jumbo, PA-C 01/06/2015, 8:03 AM Tyreik Delahoussaye, MD 01/06/2015

## 2015-01-06 NOTE — Progress Notes (Signed)
Patient ID: Darryl Ryan, male   DOB: [birth year], 81 y.o.   MRN: [id number]  [facility] PROGRESS NOTE  Darryl Ryan [id number] DOB: Oct 25, 1923 DOA: 01/05/2015 PCP: Mathews Argyle, MD   Brief narrative:    81 yo male with HTN, CKD stage II - III, recently hospitalized from 9/13 - 9/20 for evaluation of acute on chronic renal failure determined to be secondary to dehydration, lisinopril and MM (baseline Cr is ~ 2 - 2.), follows with Dr. Alvy Bimler, currently receiving Velcade injections. Pt now presented [facility] ED with main concern on one week of progressive weakness and poor oral intake. Patient denies any nausea, vomiting, diarrhea, or constipation. He also denies any recent fevers or chills. His wife reports that BP has been high and difficult to control and he has had medication dose increased on one of his BP meds but she is not sure which one.   In ED, blood work notable for WBC 5.2, Hg 9.1, Plt 75K and Cr 1.55. TR[facility] asked to admit for further evaluation.  Assessment/Plan:    Principal Problem:  Generalized weakness - appears to be multifactorial in etiology and secondary to progressive failure to thrive, poor oral intake, recent chemo - also ? orthostasis - better this AM, pending orthostatic vitals - PT eval requested, appreciate nutritionist assistance as well   Active Problems:  Pancytopenia ([place]) secondary to MM and recent chemo Velcade  - blood counts overall stable with Plt being slightly lower compared to last admission - no signs of active bleeding - CBC in AM   Hypertensive urgency - on Norvasc, Hydralazine, metoprolol at home - increase the dose of metoprolol as SBP is still > 165 - will continue Hydralazine as needed    Multiple myeloma ([place]) - will notify Dr. Alvy Bimler of pt's admission   Chronic kidney disease, stage IV (severe) ([place]) - Cr is better compared to recent admission, continues trending down  - BMP in AM   Chronic  lower back pain - in the setting of multiple lumbar compression fractures - was evaluated by IR on last admission but pt determined to be non operative candidate  - provide analgesia as needed     Moderate malnutrition - nutritionist consulted    DVT prophylaxis - Lovenox SQ  Code Status: Full.  Family Communication:  plan of care discussed with the patient and wife at bedside  Disposition Plan: Home when stable.   IV access:  Peripheral IV  Procedures and diagnostic studies:    Dg Chest 2 View 01/05/2015 Mild interstitial prominence consistent with low-grade interstitial edema or less likely interstitial pneumonia. New mild enlargement of the cardiac silhouette with central pulmonary vascular congestion.   Ct Head Wo Contrast 01/05/2015 No acute intracranial findings. 2. Periventricular white matter and corona radiata hypodensities favor chronic ischemic microvascular white matter disease. 3. No definite calvarial myeloma lesions.  Medical Consultants:  Oncologist    Other Consultants:  PT Nutritionist   IAnti-Infectives:   None   [name], MD  [place] Pager [phone]  If 7PM-7AM, please contact night-coverage [email or web address] Password [facility] 01/06/2015, 6:36 AM   LOS: 1 day   HPI/Subjective: No events overnight.   Objective: Filed Vitals:   01/05/15 1111 01/05/15 1321 01/05/15 1657 01/05/15 2120  BP: 154/82 182/78 166/97   Pulse: 65 66 78   Temp: 98.2 F (36.8 C) 98.3 F (36.8 C) 98.2 F (36.8 C) 99 F (37.2 C)  TempSrc: Oral Rectal Oral Oral  Resp: 20 20 19  18  Height:   5' 7"  (1.702 m)   Weight:   68 kg (149 lb 14.6 oz)   SpO2: 98% 98% 98% 99%    Intake/Output Summary (Last 24 hours) at 01/06/15 0636 Last data filed at 01/05/15 2300  Gross per 24 hour  Intake    240 ml  Output      0 ml  Net    240 ml    Exam:   General:  Pt is alert, follows commands appropriately, not in acute distress  Cardiovascular: Regular rate and rhythm, no  rubs, no gallops  Respiratory: Clear to auscultation bilaterally, no wheezing, no crackles, no rhonchi  Abdomen: Soft, non tender, non distended, bowel sounds present, no guarding   Data Reviewed: Basic Metabolic Panel:  Recent Labs Lab 12/31/14 1435 01/05/15 1035 01/06/15 0532  NA 141 140 139  K 4.4 3.5 3.9  CL  --  103 105  CO2 28 26 27   GLUCOSE 160* 132* 147*  BUN 40.5* 38* 33*  CREATININE 1.9* 1.55* 1.37*  CALCIUM 9.1 8.8* 8.2*   Liver Function Tests:  Recent Labs Lab 12/31/14 1435 01/05/15 1035  AST 16 22  ALT 19 25  ALKPHOS 120 109  BILITOT 0.30 0.8  PROT 8.4* 7.5  ALBUMIN 2.7* 2.8*   CBC:  Recent Labs Lab 12/31/14 1435 01/05/15 1035 01/06/15 0532  WBC 3.8* 5.2 3.3*  NEUTROABS 3.2 3.8  --   HGB 9.4* 9.1* 8.7*  HCT 28.6* 27.2* 26.2*  MCV 102.6* 100.7* 101.6*  PLT 97* 75* 82*   CBG:  Recent Labs Lab 01/05/15 1006  GLUCAP 129*    No results found for this or any previous visit (from the past 240 hour(s)).   Scheduled Meds: . acyclovir  400 mg Oral Daily  . amLODipine  10 mg Oral Daily  . aspirin EC  81 mg Oral Daily  . dexamethasone  4 mg Oral Daily  . donepezil  10 mg Oral QHS  . doxazosin  2 mg Oral Daily  . enoxaparin (LOVENOX) injection  30 mg Subcutaneous Q24H  . feeding supplement (ENSURE ENLIVE)  237 mL Oral BID BM  . haloperidol lactate      . hydrALAZINE  10 mg Oral 3 times per day  . memantine  10 mg Oral BID  . metoprolol tartrate  12.5 mg Oral BID  . multivitamin with minerals  1 tablet Oral Daily  . vitamin C  1,000 mg Oral Daily   Continuous Infusions:

## 2015-01-06 NOTE — Evaluation (Signed)
Physical Therapy Evaluation Patient Details Name: Darryl Ryan MRN: [id number] DOB: 11/17/1923 Today's Date: 01/06/2015   History of Present Illness  81 yo male admitted with generalized weakness. Hx of dementia, HTN, Hep B, multiple myeloma.   Clinical Impression  On eval, pt required Min assist for mobility-walked ~200 feet with 1 HHA. Pt participated well with session. Wife not present during session. Recommend HHPT and 24 hour supervision/assist.     Follow Up Recommendations Home health PT; 24 hour supervision/assist    Equipment Recommendations  None recommended by PT    Recommendations for Other Services       Precautions / Restrictions Precautions Precautions: Fall Restrictions Weight Bearing Restrictions: No      Mobility  Bed Mobility Overal bed mobility: Needs Assistance Bed Mobility: Supine to Sit     Supine to sit: Min assist     General bed mobility comments: Assist for trunk to upright. 1 HHA given  Transfers Overall transfer level: Needs assistance Equipment used: 1 person hand held assist Transfers: Sit to/from Stand Sit to Stand: Min assist         General transfer comment: assist to rise, stabilize  Ambulation/Gait Ambulation/Gait assistance: Min assist Ambulation Distance (Feet): 200 Feet Assistive device: 1 person hand held assist Gait Pattern/deviations: Step-through pattern;Decreased stride length     General Gait Details: Assist to stabilize.   Stairs            Wheelchair Mobility    Modified Rankin (Stroke Patients Only)       Balance Overall balance assessment: Needs assistance         Standing balance support: During functional activity Standing balance-Leahy Scale: Fair                               Pertinent Vitals/Pain Pain Assessment: Faces Faces Pain Scale: Hurts a little bit Pain Location: lower R back/hip area Pain Descriptors / Indicators: Sore Pain Intervention(s): Monitored  during session    Home Living Family/patient expects to be discharged to:: Private residence Living Arrangements: Spouse/significant other               Additional Comments: wife not present to provide PLOF/home environment info    Prior Function                 Hand Dominance        Extremity/Trunk Assessment   Upper Extremity Assessment: Overall WFL for tasks assessed           Lower Extremity Assessment: Generalized weakness      Cervical / Trunk Assessment: Normal  Communication   Communication: No difficulties  Cognition Arousal/Alertness: Awake/alert Behavior During Therapy: WFL for tasks assessed/performed Overall Cognitive Status: History of cognitive impairments - at baseline                      General Comments      Exercises        Assessment/Plan    PT Assessment Patient needs continued PT services  PT Diagnosis Generalized weakness;Difficulty walking   PT Problem List Decreased balance;Decreased mobility;Decreased safety awareness;Pain;Decreased cognition  PT Treatment Interventions Gait training;Functional mobility training;Therapeutic activities;Patient/family education;Balance training;Therapeutic exercise   PT Goals (Current goals can be found in the Care Plan section) Acute Rehab PT Goals Patient Stated Goal: none stated PT Goal Formulation: Patient unable to participate in goal setting Time For Goal Achievement: 01/20/15 Potential to  Achieve Goals: Good    Frequency Min 3X/week   Barriers to discharge        Co-evaluation               End of Session Equipment Utilized During Treatment: Gait belt Activity Tolerance: Patient tolerated treatment well Patient left: in bed;with call bell/phone within reach;with nursing/sitter in room      Functional Assessment Tool Used: clinical judgement Functional Limitation: Mobility: Walking and moving around Mobility: Walking and Moving Around Current Status  (Y0511): At least 20 percent but less than 40 percent impaired, limited or restricted Mobility: Walking and Moving Around Goal Status [phone]): At least 1 percent but less than 20 percent impaired, limited or restricted    Time: 7356-7014 PT Time Calculation (min) (ACUTE ONLY): 14 min   Charges:   PT Evaluation $Initial PT Evaluation Tier I: 1 Procedure     PT G Codes:   PT G-Codes **NOT FOR INPATIENT CLASS** Functional Assessment Tool Used: clinical judgement Functional Limitation: Mobility: Walking and moving around Mobility: Walking and Moving Around Current Status (D0301): At least 20 percent but less than 40 percent impaired, limited or restricted Mobility: Walking and Moving Around Goal Status [phone]): At least 1 percent but less than 20 percent impaired, limited or restricted    Weston Anna, MPT Pager: [phone]

## 2015-01-06 NOTE — Care Management Note (Signed)
Case Management Note  Patient Details  Name: ROEL DOUTHAT MRN: [id number] Date of Birth: May 30, 1923  Subjective/Objective:  Current status observation.  Spoke to spouse on phone-informed her about observation status.She voiced understanding.She plans to take him back home. She states he currently receives [facility]-i will confirm.PT cons-await recommendations.     1:1             Action/Plan:d/c plan home.   Expected Discharge Date:  01/08/15               Expected Discharge Plan:  [place]  In-House Referral:     Discharge planning Services  CM Consult  Post Acute Care Choice:  Home Health (will confirm if [place] providing services.) Choice offered to:     DME Arranged:    DME Agency:     HH Arranged:    HH Agency:     Status of Service:  In process, will continue to follow  Medicare Important Message Given:    Date Medicare IM Given:    Medicare IM give by:    Date Additional Medicare IM Given:    Additional Medicare Important Message give by:     If discussed at [place] of Stay Meetings, dates discussed:    Additional Comments:  Dessa Phi, RN 01/06/2015, 1:49 PM

## 2015-01-06 NOTE — Care Management Note (Signed)
Case Management Note  Patient Details  Name: GERROD MAULE MRN: [id number] Date of Birth: 1923-10-02  Subjective/Objective: 81 y/o m admitted w/weakness.EZ:MOQHUTML, CKD.From home w/spouse.PT cons-await recommendations. Current status-placed in observation.Nsg/CSW notified. Will attempt to contact spouse to inform.                   Action/Plan:Monitor progress for d/c plans.   Expected Discharge Date:                 Expected Discharge Plan:  Home/Self Care  In-House Referral:     Discharge planning Services  CM Consult  Post Acute Care Choice:    Choice offered to:     DME Arranged:    DME Agency:     HH Arranged:    HH Agency:     Status of Service:  In process, will continue to follow  Medicare Important Message Given:    Date Medicare IM Given:    Medicare IM give by:    Date Additional Medicare IM Given:    Additional Medicare Important Message give by:     If discussed at [place] of Stay Meetings, dates discussed:    Additional Comments:  Dessa Phi, RN 01/06/2015, 1:34 PM

## 2015-01-07 DIAGNOSIS — R4182 Altered mental status, unspecified: Secondary | ICD-10-CM | POA: Diagnosis not present

## 2015-01-07 DIAGNOSIS — D61810 Antineoplastic chemotherapy induced pancytopenia: Secondary | ICD-10-CM

## 2015-01-07 DIAGNOSIS — M545 Low back pain: Secondary | ICD-10-CM | POA: Diagnosis not present

## 2015-01-07 DIAGNOSIS — N184 Chronic kidney disease, stage 4 (severe): Secondary | ICD-10-CM | POA: Diagnosis not present

## 2015-01-07 DIAGNOSIS — R531 Weakness: Secondary | ICD-10-CM | POA: Diagnosis not present

## 2015-01-07 DIAGNOSIS — R410 Disorientation, unspecified: Secondary | ICD-10-CM | POA: Diagnosis not present

## 2015-01-07 LAB — BASIC METABOLIC PANEL
ANION GAP: 8 (ref 5–15)
BUN: 34 mg/dL — ABNORMAL HIGH (ref 6–20)
CALCIUM: 8.7 mg/dL — AB (ref 8.9–10.3)
CHLORIDE: 106 mmol/L (ref 101–111)
CO2: 26 mmol/L (ref 22–32)
Creatinine, Ser: 1.36 mg/dL — ABNORMAL HIGH (ref 0.61–1.24)
GFR calc non Af Amer: 45 mL/min — ABNORMAL LOW (ref >60)
GFR, EST AFRICAN AMERICAN: 52 mL/min — AB (ref >60)
GLUCOSE: 128 mg/dL — AB (ref 65–99)
Potassium: 3.9 mmol/L (ref 3.5–5.1)
Sodium: 140 mmol/L (ref 135–145)

## 2015-01-07 LAB — CBC
HEMATOCRIT: 26.5 % — AB (ref 39.0–52.0)
HEMOGLOBIN: 8.9 g/dL — AB (ref 13.0–17.0)
MCH: 33.8 pg (ref 26.0–34.0)
MCHC: 33.6 g/dL (ref 30.0–36.0)
MCV: 100.8 fL — AB (ref 78.0–100.0)
Platelets: 82 10*3/uL — ABNORMAL LOW (ref 150–400)
RBC: 2.63 MIL/uL — ABNORMAL LOW (ref 4.22–5.81)
RDW: 18.1 % — AB (ref 11.5–15.5)
WBC: 4.8 10*3/uL (ref 4.0–10.5)

## 2015-01-07 NOTE — Progress Notes (Signed)
Patient ID: Darryl Ryan, male   DOB: [birth year], 81 y.o.   MRN: [id number]  [facility] PROGRESS NOTE  Darryl Ryan BTD:[id number] DOB: [birth year] DOA: 01/05/2015 PCP: Mathews Argyle, MD   Brief narrative:    81 yo male with HTN, CKD stage II - III, recently hospitalized from 9/13 - 9/20 for evaluation of acute on chronic renal failure determined to be secondary to dehydration, lisinopril and MM (baseline Cr is ~ 2 - 2.), follows with Dr. Alvy Bimler, currently receiving Velcade injections. Pt now presented to [facility] ED with main concern on one week of progressive weakness and poor oral intake. Patient denies any nausea, vomiting, diarrhea, or constipation. He also denies any recent fevers or chills. His wife reports that BP has been high and difficult to control and he has had medication dose increased on one of his BP meds but she is not sure which one.   In ED, blood work notable for WBC 5.2, Hg 9.1, Plt 75K and Cr 1.55. TR[facility] asked to admit for further evaluation.  Assessment/Plan:    Principal Problem:  Generalized weakness - appears to be multifactorial in etiology and secondary to progressive failure to thrive, poor oral intake, recent chemo - also ? orthostasis - pt confusion overnight, better this AM - will likely go home if confusion resolved this afternoon or tomorrow am   Active Problems:   Acute hospital delirium  - required restraints overnight - will need to discuss with wife about disposition plan    Pancytopenia ([place]) secondary to MM and recent chemo Velcade  - blood counts overall stable with Plt being slightly lower compared to last admission - no signs of active bleeding - CBC in AM   Hypertensive urgency - on Norvasc, Hydralazine, metoprolol at home - increased the dose of Hydralazine  - will continue Hydralazine as needed    Multiple myeloma ([place]) - appreciate Dr. Alvy Bimler assistance, she will see pt next week in her office    Chronic  kidney disease, stage IV (severe) ([place]) - Cr is better compared to recent admission, continues trending down  - BMP in AM   Chronic lower back pain - in the setting of multiple lumbar compression fractures - was evaluated by IR on last admission but pt determined to be non operative candidate  - provide analgesia as needed     Moderate malnutrition - nutritionist consulted    DVT prophylaxis - Lovenox SQ  Code Status: Full.  Family Communication:  plan of care discussed with the patient and wife at bedside  Disposition Plan: Home when stable.   IV access:  Peripheral IV  Procedures and diagnostic studies:    Dg Chest 2 View 01/05/2015 Mild interstitial prominence consistent with low-grade interstitial edema or less likely interstitial pneumonia. New mild enlargement of the cardiac silhouette with central pulmonary vascular congestion.   Ct Head Wo Contrast 01/05/2015 No acute intracranial findings. 2. Periventricular white matter and corona radiata hypodensities favor chronic ischemic microvascular white matter disease. 3. No definite calvarial myeloma lesions.  Medical Consultants:  Oncologist    Other Consultants:  PT Nutritionist   IAnti-Infectives:   None   [name], MD  [place] Pager [phone]  If 7PM-7AM, please contact night-coverage [email or web address] Password [email or web address] 01/07/2015, 12:44 PM   LOS: 2 days   HPI/Subjective: No events overnight.   Objective: Filed Vitals:   01/06/15 1441 01/06/15 2109 01/07/15 0626 01/07/15 0914  BP:  152/71 156/74 155/72  Pulse:  65 68 70  Temp: 97.9 F (36.6 C) 97.4 F (36.3 C) 98.5 F (36.9 C)   TempSrc: Oral Oral Oral   Resp: 18 18 18    Height:      Weight:      SpO2: 99% 99% 100%     Intake/Output Summary (Last 24 hours) at 01/07/15 1244 Last data filed at 01/07/15 0900  Gross per 24 hour  Intake    960 ml  Output   1900 ml  Net   -940 ml    Exam:   General:  Pt is somnolent but easy to awake,  confused   Cardiovascular: Regular rate and rhythm, no rubs, no gallops  Respiratory: Clear to auscultation bilaterally, no wheezing, no crackles, no rhonchi  Abdomen: Soft, non tender, non distended, bowel sounds present, no guarding   Data Reviewed: Basic Metabolic Panel:  Recent Labs Lab 12/31/14 1435 01/05/15 1035 01/06/15 0532 01/07/15 0425  NA 141 140 139 140  K 4.4 3.5 3.9 3.9  CL  --  103 105 106  CO2 28 26 27 26   GLUCOSE 160* 132* 147* 128*  BUN 40.5* 38* 33* 34*  CREATININE 1.9* 1.55* 1.37* 1.36*  CALCIUM 9.1 8.8* 8.2* 8.7*   Liver Function Tests:  Recent Labs Lab 12/31/14 1435 01/05/15 1035  AST 16 22  ALT 19 25  ALKPHOS 120 109  BILITOT 0.30 0.8  PROT 8.4* 7.5  ALBUMIN 2.7* 2.8*   CBC:  Recent Labs Lab 12/31/14 1435 01/05/15 1035 01/06/15 0532 01/07/15 0425  WBC 3.8* 5.2 3.3* 4.8  NEUTROABS 3.2 3.8  --   --   HGB 9.4* 9.1* 8.7* 8.9*  HCT 28.6* 27.2* 26.2* 26.5*  MCV 102.6* 100.7* 101.6* 100.8*  PLT 97* 75* 82* 82*   CBG:  Recent Labs Lab 01/05/15 1006  GLUCAP 129*   Scheduled Meds: . acyclovir  400 mg Oral Daily  . amLODipine  10 mg Oral Daily  . aspirin EC  81 mg Oral Daily  . dexamethasone  4 mg Oral Daily  . donepezil  10 mg Oral QHS  . doxazosin  2 mg Oral Daily  . enoxaparin (LOVENOX) injection  40 mg Subcutaneous Q24H  . feeding supplement (ENSURE ENLIVE)  237 mL Oral BID BM  . hydrALAZINE  10 mg Oral 3 times per day  . lactose free nutrition  237 mL Oral BID BM  . memantine  10 mg Oral BID  . metoprolol tartrate  12.5 mg Oral BID  . multivitamin with minerals  1 tablet Oral Daily  . vitamin C  1,000 mg Oral Daily   Continuous Infusions:

## 2015-01-07 NOTE — Discharge Instructions (Signed)
Delirium Delirium is a state of mental confusion. It comes on quickly and causes significant changes in a person's thinking and behavior. People with delirium usually have trouble paying attention to what is going on or knowing where they are. They may become very withdrawn or very emotional and unable to sit still. They may even see or feel things that are not there (hallucinations). Delirium is a sign of a serious underlying medical condition. CAUSES Delirium occurs when something suddenly affects the signals that the brain sends out. Brain signals can be affected by anything that puts severe stress on the body and brain and causes brain chemicals to be out of balance. The most common causes of delirium include:  Infections. These may be bacterial, viral, fungal, or protozoal.  Medicines. These include many over-the-counter and prescription medicines.  Recreational drugs.  Substance withdrawal. This occurs with sudden discontinuation of alcohol, certain medicines, or recreational drugs.  Surgery.  Sudden vascular events, such as stroke, brain hemorrhage, and severe migraine.  Other brain disorders, such as tumors, seizures, and physical head trauma.  Metabolic disorders, such as kidney or liver failure.  Low blood oxygen (anoxia). This may occur with lung disease, cardiac arrest, or carbon monoxide poisoning.  Hormone imbalances (endocrinopathies), such as an overactive thyroid (hyperthyroidism) or underactive thyroid (hypothyroidism).  Vitamin deficiencies. RISK FACTORS This condition is more likely to develop in:  Children.  Older people.  People who live alone.  People who have vision loss or hearing loss.  People who have existing brain disease, such as dementia.  People who have long-lasting (chronic) medical conditions, such as heart disease.  People who are hospitalized for long periods of time. SYMPTOMS Delirium starts with a sudden change in a person's thinking  or behavior. Symptoms come and go (fluctuate) over time, and they are often worse at the end of the day. Symptoms include:  Not being able to stay awake (drowsiness) or pay attention.  Being confused about places, time, and people.  Forgetfulness.  Having extreme energy levels. These may be low or high.  Changes in sleep patterns.  Extreme mood swings, such as anger or anxiety.  Focusing on things or ideas that are not important.  Rambling and senseless talking.  Difficulty speaking, understanding speech, or both.  Hallucinations.  Tremor or unsteady gait. DIAGNOSIS People with delirium may not realize that they have the condition. Often, a family member or health care provider is the first person to notice the changes. The health care provider will obtain a detailed history of current symptoms, medical issues, medicines, and recreational drug use. The health care provider will perform a mental status examination by:  Asking questions to check for confusion.  Watching for abnormal behavior. The health care provider may perform a physical exam and order lab tests or additional studies to determine the cause of the delirium. TREATMENT Treatment of delirium depends on the cause and severity. Delirium usually goes away within days or weeks of treating the underlying cause. In the meantime, the person should not be left alone because he or she may accidentally cause self-harm. Treatment includes supportive care, such as:  Increased light during the day and decreased light at night.  Low noise level.  Uninterrupted sleep.  A regular daily schedule.  Clocks and calendars to help with orientation.  Familiar objects, including the person's pictures and clothing.  Frequent visits from familiar family and friends.  Healthy diet.  Exercise. In more severe cases of delirium, medicine may be prescribed  to help the person to keep calm and think more clearly. HOME CARE  INSTRUCTIONS  Any supportive care should be continued as told by the health care provider.  All medicines should be used as told by the health care provider. This is important.  The health care provider should be consulted before over-the-counter medicines, herbs, or supplements are used.  All follow-up visits should be kept as told by the health care provider. This is important.  Alcohol and recreational drugs should be avoided as told by the health care provider. SEEK MEDICAL CARE IF:  Symptoms do not get better or they become worse.  New symptoms of delirium develop.  Caring for the person at home does not seem safe.  Eating, drinking, or communicating stops.  There are side effects of medicines, such as changes in sleep patterns, dizziness, weight gain, restlessness, movement changes, or tremors. SEEK IMMEDIATE MEDICAL CARE IF:  Serious thoughts occur about self-harm or about hurting others.  There are serious side effects of medicine, such as:  Swelling of the face, lips, tongue, or throat.  Fever, confusion, muscle spasms, or seizures.   This information is not intended to replace advice given to you by your health care provider. Make sure you discuss any questions you have with your health care provider.   Document Released: 12/05/2011 Document Revised: 07/27/2014 Document Reviewed: 05/05/2014 Elsevier Interactive Patient Education [employer].

## 2015-01-07 NOTE — Discharge Summary (Signed)
Physician Discharge Summary  Darryl Ryan VFI:[id number] DOB: 1923-10-01 DOA: 01/05/2015  PCP: Mathews Argyle, MD  Admit date: 01/05/2015 Discharge date: 01/07/2015  Recommendations for Outpatient Follow-up:  1. Pt will need to follow up with PCP in 2-3 weeks post discharge 2. Please obtain BMP to evaluate electrolytes and kidney function 3. Please also check CBC to evaluate Hg and Hct levels 4. Pt has an appointment scheduled with Dr. 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 Alvy Bimler, currently receiving Velcade injections. Pt now presented to [facility] ED with main concern on one week of progressive weakness and poor oral intake. Patient denies any nausea, vomiting, diarrhea, or constipation. He also denies any recent fevers or chills. His wife reports that BP has been high and difficult to control and he has had medication dose increased on one of his BP meds but she is not sure which one.   In ED, blood work notable for WBC 5.2, Hg 9.1, Plt 75K and Cr 1.55. TRH asked to admit for further evaluation.  Assessment/Plan:    Principal Problem:  Generalized weakness - appears to be multifactorial in etiology and secondary to progressive failure to thrive, poor oral intake, recent chemo - also ?  orthostasis - pt confusion overnight, better this AM - wife agrees to take him home with [facility] PT, RN, aid   Active Problems:  Acute hospital delirium  - required restraints overnight - stable this AM - OK for discharge    Pancytopenia ([place]) secondary to MM and recent chemo Velcade  - blood counts overall stable with Plt being slightly lower compared to last admission - no signs of active bleeding   Hypertensive urgency - on Norvasc, Hydralazine, metoprolol at home - increased the dose of Hydralazine for better blood pressure control    Multiple myeloma ([place]) - appreciate Dr. Alvy Bimler assistance, she will see pt next week in her office    Chronic kidney disease, stage IV (severe) ([place]) - Cr is better compared to recent admission, continues trending down    Chronic lower back pain - in the setting of multiple lumbar compression fractures - was evaluated by IR on last admission but pt determined to be non operative candidate  - provide analgesia as needed    Moderate malnutrition - nutritionist consulted   Code Status: Full.  Family Communication: plan of care discussed with the patient and wife at bedside  Disposition Plan: Home   IV access:  Peripheral IV  Procedures and diagnostic studies:   Dg Chest 2 View 01/05/2015 Mild interstitial prominence consistent with low-grade interstitial edema or less likely interstitial pneumonia. New mild enlargement of the cardiac silhouette with central pulmonary vascular congestion.   Ct Head Wo Contrast 01/05/2015 No acute intracranial findings. 2. Periventricular white matter  and corona radiata hypodensities favor chronic ischemic microvascular white matter disease. 3. No definite calvarial myeloma lesions.  Medical Consultants:  Oncologist   Other Consultants:  PT Nutritionist   IAnti-Infectives:   None       Discharge Exam: Filed Vitals:   01/07/15 1315  BP: 123/70  Pulse: 70  Temp: 98.5  F (36.9 C)  Resp: 20   Filed Vitals:   01/06/15 2109 01/07/15 0626 01/07/15 0914 01/07/15 1315  BP: 152/71 156/74 155/72 123/70  Pulse: 65 68 70 70  Temp: 97.4 F (36.3 C) 98.5 F (36.9 C)  98.5 F (36.9 C)  TempSrc: Oral Oral  Oral  Resp: 18 18  20   Height:      Weight:      SpO2: 99% 100%  100%    General: Pt is alert, not in acute distress Cardiovascular: Regular rate and rhythm, S1/S2 +, no murmurs, no rubs, no gallops Respiratory: Clear to auscultation bilaterally, no wheezing, no crackles, no rhonchi Abdominal: Soft, non tender, non distended, bowel sounds +, no guarding  Discharge Instructions  Discharge Instructions    Diet - low sodium heart healthy    Complete by:  As directed      Increase activity slowly    Complete by:  As directed             Medication List    TAKE these medications        acetaminophen 500 MG tablet  Commonly known as:  TYLENOL  Take 1,000 mg by mouth every 6 (six) hours as needed for moderate pain.     acyclovir 400 MG tablet  Commonly known as:  ZOVIRAX  Take 1 tablet (400 mg total) by mouth daily.     amLODipine 10 MG tablet  Commonly known as:  NORVASC  Take 10 mg by mouth daily.     aspirin 81 MG tablet  Take 81 mg by mouth daily.     dexamethasone 4 MG tablet  Commonly known as:  DECADRON  Take 1 tablet (4 mg total) by mouth daily.     donepezil 10 MG tablet  Commonly known as:  ARICEPT  Take 10 mg by mouth at bedtime.     doxazosin 2 MG tablet  Commonly known as:  CARDURA  Take 2 mg by mouth daily.     Fish Oil 1000 MG Caps  Take 1 capsule by mouth daily.     hydrALAZINE 10 MG tablet  Commonly known as:  APRESOLINE  Take 1 tablet (10 mg total) by mouth every 8 (eight) hours.     lenalidomide 2.5 MG capsule  Commonly known as:  REVLIMID  Take 1 capsule daily for 14 days then rest 7 days     memantine 10 MG tablet  Commonly known as:  NAMENDA  Take 10 mg by mouth 2 (two) times daily.     metoprolol  tartrate 25 MG tablet  Commonly known as:  LOPRESSOR  Take 25 mg by mouth 2 (two) times daily.     multivitamin with minerals Tabs tablet  Take 1 tablet by mouth daily.     ondansetron 8 MG tablet  Commonly known as:  ZOFRAN  Take 1 tablet (8 mg total) by mouth every 8 (eight) hours as needed for nausea.     oxyCODONE 5 MG immediate release tablet  Commonly known as:  Oxy IR/ROXICODONE  Take 1 tablet (5 mg total) by mouth every 4 (four) hours as needed for moderate  pain.     PRESCRIPTION MEDICATION  Chemo - [facility]     prochlorperazine 10 MG tablet  Commonly known as:  COMPAZINE  Take 1 tablet (10 mg total) by mouth every 6 (six) hours as needed (Nausea or vomiting).     vitamin C 1000 MG tablet  Take 1,000 mg by mouth daily.           Follow-up Information    Follow up with [facility].   Specialty:  Home Health Services   Why:  HHRN/PT/OT/aide/SW   Contact information:   [place] [address] [phone]       Follow up with Mathews Argyle, MD.   Specialty:  Internal Medicine   Contact information:   [place] [employer] [address] [place] [address] [phone]       Call [name], MD.   Specialty:  Internal Medicine   Why:  As needed call my cell phone [phone]   Contact information:   [address] [place] [address] [phone]        The results of significant diagnostics from this hospitalization (including imaging, microbiology, ancillary and laboratory) are listed below for reference.     Microbiology: No results found for this or any previous visit (from the past 240 hour(s)).   Labs: Basic Metabolic Panel:  Recent Labs Lab 12/31/14 1435 01/05/15 1035 01/06/15 0532 01/07/15 0425  NA 141 140 139 140  K 4.4 3.5 3.9 3.9  CL  --  103 105 106  CO2 28 26 27 26   GLUCOSE 160* 132* 147* 128*  BUN 40.5* 38* 33* 34*  CREATININE 1.9* 1.55* 1.37* 1.36*  CALCIUM 9.1 8.8*  8.2* 8.7*   Liver Function Tests:  Recent Labs Lab 12/31/14 1435 01/05/15 1035  AST 16 22  ALT 19 25  ALKPHOS 120 109  BILITOT 0.30 0.8  PROT 8.4* 7.5  ALBUMIN 2.7* 2.8*   No results for input(s): LIPASE, AMYLASE in the last 168 hours. No results for input(s): AMMONIA in the last 168 hours. CBC:  Recent Labs Lab 12/31/14 1435 01/05/15 1035 01/06/15 0532 01/07/15 0425  WBC 3.8* 5.2 3.3* 4.8  NEUTROABS 3.2 3.8  --   --   HGB 9.4* 9.1* 8.7* 8.9*  HCT 28.6* 27.2* 26.2* 26.5*  MCV 102.6* 100.7* 101.6* 100.8*  PLT 97* 75* 82* 82*   CBG:  Recent Labs Lab 01/05/15 1006  GLUCAP 129*   SIGNED: Time coordinating discharge: 30 minutes  MAGICK-Emmerson Shuffield, MD  [facility] 01/07/2015, 2:00 PM Pager [phone]  If 7PM-7AM, please contact night-coverage [email or web address] Password [email or web address]

## 2015-01-07 NOTE — Progress Notes (Signed)
Pt confused and agitated. Pt is becoming aggressive with staff and swinging his legs and arms at [place]. No orders for Haldol or Ativan. Mitts placed d/t grabbing at lines and condom cath. Paged hopistalist for orders.

## 2015-01-07 NOTE — Care Management Note (Signed)
Case Management Note  Patient Details  Name: Darryl Ryan MRN: [id number] Date of Birth: 01/31/1924  Subjective/Objective:Spoke to spouse about d/c, she is in agreement to home w/HHC that was active-[facility]-rep [place] Evens aware of HHRN/PT/OT/aide/sw.Spouse has own transp home.                    Action/Plan:d/c home w/HHC.   Expected Discharge Date:  01/08/15               Expected Discharge Plan:  [place]  In-House Referral:     Discharge planning Services  CM Consult  Post Acute Care Choice:  Home Health (will confirm if [facility] providing services.) Choice offered to:     DME Arranged:    DME Agency:     HH Arranged:  RN, PT, OT, Nurse's Aide, Social Work [employer] Agency:  [facility]  Status of Service:  Completed, signed off  Medicare Important Message Given:    Date Medicare IM Given:    Medicare IM give by:    Date Additional Medicare IM Given:    Additional Medicare Important Message give by:     If discussed at [place] of Stay Meetings, dates discussed:    Additional Comments:  Dessa Phi, RN 01/07/2015, 1:55 PM

## 2015-01-11 ENCOUNTER — Other Ambulatory Visit: Payer: Self-pay | Admitting: *Deleted

## 2015-01-11 ENCOUNTER — Telehealth: Payer: Self-pay | Admitting: Hematology and Oncology

## 2015-01-11 ENCOUNTER — Encounter: Payer: Self-pay | Admitting: Hematology and Oncology

## 2015-01-11 ENCOUNTER — Ambulatory Visit (HOSPITAL_BASED_OUTPATIENT_CLINIC_OR_DEPARTMENT_OTHER): Payer: Medicare Other

## 2015-01-11 ENCOUNTER — Other Ambulatory Visit (HOSPITAL_BASED_OUTPATIENT_CLINIC_OR_DEPARTMENT_OTHER): Payer: Medicare Other

## 2015-01-11 ENCOUNTER — Ambulatory Visit (HOSPITAL_BASED_OUTPATIENT_CLINIC_OR_DEPARTMENT_OTHER): Payer: Medicare Other | Admitting: Hematology and Oncology

## 2015-01-11 VITALS — BP 120/59 | HR 60 | Temp 97.8°F | Resp 18 | Ht 67.0 in | Wt 160.3 lb

## 2015-01-11 DIAGNOSIS — C9000 Multiple myeloma not having achieved remission: Secondary | ICD-10-CM | POA: Diagnosis present

## 2015-01-11 DIAGNOSIS — Z5112 Encounter for antineoplastic immunotherapy: Secondary | ICD-10-CM

## 2015-01-11 DIAGNOSIS — D61810 Antineoplastic chemotherapy induced pancytopenia: Secondary | ICD-10-CM

## 2015-01-11 DIAGNOSIS — N184 Chronic kidney disease, stage 4 (severe): Secondary | ICD-10-CM | POA: Diagnosis not present

## 2015-01-11 DIAGNOSIS — M545 Low back pain, unspecified: Secondary | ICD-10-CM

## 2015-01-11 DIAGNOSIS — G8929 Other chronic pain: Secondary | ICD-10-CM | POA: Diagnosis not present

## 2015-01-11 DIAGNOSIS — C9002 Multiple myeloma in relapse: Secondary | ICD-10-CM

## 2015-01-11 LAB — CBC WITH DIFFERENTIAL/PLATELET
BASO%: 0 % (ref 0.0–2.0)
BASOS ABS: 0 10*3/uL (ref 0.0–0.1)
EOS ABS: 0.1 10*3/uL (ref 0.0–0.5)
EOS%: 0.8 % (ref 0.0–7.0)
HEMATOCRIT: 28.3 % — AB (ref 38.4–49.9)
HEMOGLOBIN: 9.6 g/dL — AB (ref 13.0–17.1)
LYMPH#: 0.5 10*3/uL — AB (ref 0.9–3.3)
LYMPH%: 8.3 % — ABNORMAL LOW (ref 14.0–49.0)
MCH: 34.8 pg — AB (ref 27.2–33.4)
MCHC: 33.9 g/dL (ref 32.0–36.0)
MCV: 102.5 fL — AB (ref 79.3–98.0)
MONO#: 0.7 10*3/uL (ref 0.1–0.9)
MONO%: 11.7 % (ref 0.0–14.0)
NEUT%: 79.2 % — ABNORMAL HIGH (ref 39.0–75.0)
NEUTROS ABS: 4.7 10*3/uL (ref 1.5–6.5)
PLATELETS: 127 10*3/uL — AB (ref 140–400)
RBC: 2.76 10*6/uL — ABNORMAL LOW (ref 4.20–5.82)
RDW: 18.3 % — AB (ref 11.0–14.6)
WBC: 6 10*3/uL (ref 4.0–10.3)

## 2015-01-11 LAB — COMPREHENSIVE METABOLIC PANEL (CC13)
ALBUMIN: 2.9 g/dL — AB (ref 3.5–5.0)
ALK PHOS: 135 U/L (ref 40–150)
ALT: 23 U/L (ref 0–55)
AST: 13 U/L (ref 5–34)
Anion Gap: 10 mEq/L (ref 3–11)
BILIRUBIN TOTAL: 0.73 mg/dL (ref 0.20–1.20)
BUN: 36 mg/dL — ABNORMAL HIGH (ref 7.0–26.0)
CALCIUM: 8.6 mg/dL (ref 8.4–10.4)
CO2: 25 mEq/L (ref 22–29)
Chloride: 105 mEq/L (ref 98–109)
Creatinine: 1.5 mg/dL — ABNORMAL HIGH (ref 0.7–1.3)
EGFR: 46 mL/min/{1.73_m2} — AB (ref >90)
GLUCOSE: 160 mg/dL — AB (ref 70–140)
POTASSIUM: 3.8 meq/L (ref 3.5–5.1)
SODIUM: 140 meq/L (ref 136–145)
TOTAL PROTEIN: 7.1 g/dL (ref 6.4–8.3)

## 2015-01-11 MED ORDER — ONDANSETRON HCL 8 MG PO TABS
ORAL_TABLET | ORAL | Status: AC
  Filled 2015-01-11: qty 1

## 2015-01-11 MED ORDER — LENALIDOMIDE 2.5 MG PO CAPS: ORAL_CAPSULE | ORAL | Status: DC

## 2015-01-11 MED ORDER — LENALIDOMIDE 2.5 MG PO CAPS
ORAL_CAPSULE | ORAL | Status: DC
Start: 2015-01-11 — End: 2015-01-11

## 2015-01-11 MED ORDER — BORTEZOMIB CHEMO SQ INJECTION 3.5 MG (2.5MG/ML)
1.3000 mg/m2 | Freq: Once | INTRAMUSCULAR | Status: AC
  Administered 2015-01-11: 2.5 mg via SUBCUTANEOUS
  Filled 2015-01-11: qty 2.5

## 2015-01-11 MED ORDER — ONDANSETRON HCL 8 MG PO TABS
8.0000 mg | ORAL_TABLET | Freq: Once | ORAL | Status: AC
  Administered 2015-01-11: 8 mg via ORAL

## 2015-01-11 NOTE — Patient Instructions (Signed)
[facility] Discharge Instructions for Patients Receiving Chemotherapy  Today you received the following chemotherapy agents Velcade. To help prevent nausea and vomiting after your treatment, we encourage you to take your nausea medication as directed.  If you develop nausea and vomiting that is not controlled by your nausea medication, call the clinic.   BELOW ARE SYMPTOMS THAT SHOULD BE REPORTED IMMEDIATELY:  *FEVER GREATER THAN 100.5 F  *CHILLS WITH OR WITHOUT FEVER  NAUSEA AND VOMITING THAT IS NOT CONTROLLED WITH YOUR NAUSEA MEDICATION  *UNUSUAL SHORTNESS OF BREATH  *UNUSUAL BRUISING OR BLEEDING  TENDERNESS IN MOUTH AND THROAT WITH OR WITHOUT PRESENCE OF ULCERS  *URINARY PROBLEMS  *BOWEL PROBLEMS  UNUSUAL RASH Items with * indicate a potential emergency and should be followed up as soon as possible.  Feel free to call the clinic you have any questions or concerns. The clinic phone number is [phone].  Please show the CHEMO ALERT CARD at check-in to the Emergency Department and triage nurse.    

## 2015-01-11 NOTE — Progress Notes (Signed)
Revlimid faxed to [employer]. Auth # [id number]

## 2015-01-11 NOTE — Telephone Encounter (Signed)
Gave adn pirnted appt sched and avs for pt for OCT and NOV

## 2015-01-12 DIAGNOSIS — D61810 Antineoplastic chemotherapy induced pancytopenia: Secondary | ICD-10-CM | POA: Insufficient documentation

## 2015-01-12 NOTE — Assessment & Plan Note (Signed)
He has significant improvement of his renal function which indicated response to treatment. We'll continue the same without dose adjustment.

## 2015-01-12 NOTE — Progress Notes (Signed)
[place] OFFICE PROGRESS NOTE  Patient Care Team: Lajean Manes, MD as PCP - General (Internal Medicine)  SUMMARY OF ONCOLOGIC HISTORY:   Multiple myeloma ([place])   12/07/2014 - 12/14/2014 Hospital Admission The patient was hospitalized for hypertensive urgency and renal failure. He was found to have multiple myeloma   12/08/2014 Imaging Skeletal survey showed old compression fracture without new bone lytic lesion   12/10/2014 Bone Marrow Biopsy [id number] bone marrow biopsy showed 69% plasma cell involvement   12/21/2014 -  Chemotherapy He was started on dexamethasone and Velcade   01/05/2015 - 01/07/2015 Hospital Admission The patient was hospitalized for altered mental status.    INTERVAL HISTORY: Please see below for problem oriented charting. He returns prior to chemotherapy. He feels well. There were no further confusion episodes. Back pain is stable. The patient denies any recent signs or symptoms of bleeding such as spontaneous epistaxis, hematuria or hematochezia.   REVIEW OF SYSTEMS:   Constitutional: Denies fevers, chills or abnormal weight loss Eyes: Denies blurriness of vision Ears, nose, mouth, throat, and face: Denies mucositis or sore throat Respiratory: Denies cough, dyspnea or wheezes Cardiovascular: Denies palpitation, chest discomfort or lower extremity swelling Gastrointestinal:  Denies nausea, heartburn or change in bowel habits Skin: Denies abnormal skin rashes Lymphatics: Denies new lymphadenopathy or easy bruising Neurological:Denies numbness, tingling or new weaknesses Behavioral/Psych: Mood is stable, no new changes  All other systems were reviewed with the patient and are negative.  I have reviewed the past medical history, past surgical history, social history and family history with the patient and they are unchanged from previous note.  ALLERGIES:  is allergic to bee venom; oxycodone; and tramadol.  MEDICATIONS:  Current Outpatient  Prescriptions  Medication Sig Dispense Refill  . acetaminophen (TYLENOL) 500 MG tablet Take 1,000 mg by mouth every 6 (six) hours as needed for moderate pain.    Marland Kitchen acyclovir (ZOVIRAX) 400 MG tablet Take 1 tablet (400 mg total) by mouth daily. 60 tablet 3  . amLODipine (NORVASC) 10 MG tablet Take 10 mg by mouth daily.    . Ascorbic Acid (VITAMIN C) 1000 MG tablet Take 1,000 mg by mouth daily.    Marland Kitchen aspirin 81 MG tablet Take 81 mg by mouth daily.    Marland Kitchen dexamethasone (DECADRON) 4 MG tablet Take 1 tablet (4 mg total) by mouth daily. 60 tablet 0  . donepezil (ARICEPT) 10 MG tablet Take 10 mg by mouth at bedtime.    Marland Kitchen doxazosin (CARDURA) 2 MG tablet Take 2 mg by mouth daily.    . hydrALAZINE (APRESOLINE) 10 MG tablet Take 1 tablet (10 mg total) by mouth every 8 (eight) hours. (Patient taking differently: Take 10 mg by mouth 3 (three) times daily. ) 90 tablet 0  . memantine (NAMENDA) 10 MG tablet Take 10 mg by mouth 2 (two) times daily.    . metoprolol tartrate (LOPRESSOR) 25 MG tablet Take 25 mg by mouth 2 (two) times daily.    . Multiple Vitamin (MULTIVITAMIN WITH MINERALS) TABS tablet Take 1 tablet by mouth daily.    . Omega-3 Fatty Acids (FISH OIL) 1000 MG CAPS Take 1 capsule by mouth daily.    Marland Kitchen PRESCRIPTION MEDICATION Chemo - [facility]    . amLODipine (NORVASC) 5 MG tablet Take 5 mg by mouth daily.  9  . doxazosin (CARDURA) 8 MG tablet Take 8 mg by mouth daily.  3  . lenalidomide (REVLIMID) 2.5 MG capsule 1 capsule daily for 14 days then rest  7 days 14 capsule 0  . ondansetron (ZOFRAN) 8 MG tablet Take 1 tablet (8 mg total) by mouth every 8 (eight) hours as needed for nausea. (Patient not taking: Reported on 01/11/2015) 30 tablet 1  . oxyCODONE (OXY IR/ROXICODONE) 5 MG immediate release tablet Take 1 tablet (5 mg total) by mouth every 4 (four) hours as needed for moderate pain. (Patient not taking: Reported on 01/05/2015) 30 tablet 0  . prochlorperazine (COMPAZINE) 10 MG tablet Take 1 tablet (10 mg  total) by mouth every 6 (six) hours as needed (Nausea or vomiting). (Patient not taking: Reported on 01/11/2015) 30 tablet 1   No current facility-administered medications for this visit.    PHYSICAL EXAMINATION: ECOG PERFORMANCE STATUS: 1 - Symptomatic but completely ambulatory  Filed Vitals:   01/11/15 1230  BP: 120/59  Pulse: 60  Temp: 97.8 F (36.6 C)  Resp: 18   Filed Weights   01/11/15 1230  Weight: 160 lb 4.8 oz (72.712 kg)    GENERAL:alert, no distress and comfortable SKIN: skin color, texture, turgor are normal, no rashes or significant lesions EYES: normal, Conjunctiva are pink and non-injected, sclera clear OROPHARYNX:no exudate, no erythema and lips, buccal mucosa, and tongue normal  NECK: supple, thyroid normal size, non-tender, without nodularity LYMPH:  no palpable lymphadenopathy in the cervical, axillary or inguinal LUNGS: clear to auscultation and percussion with normal breathing effort HEART: regular rate & rhythm and no murmurs and no lower extremity edema ABDOMEN:abdomen soft, non-tender and normal bowel sounds Musculoskeletal:no cyanosis of digits and no clubbing  NEURO: alert & oriented x 3 with fluent speech, no focal motor/sensory deficits  LABORATORY DATA:  I have reviewed the data as listed    Component Value Date/Time   NA 140 01/11/2015 1211   NA 140 01/07/2015 0425   K 3.8 01/11/2015 1211   K 3.9 01/07/2015 0425   CL 106 01/07/2015 0425   CO2 25 01/11/2015 1211   CO2 26 01/07/2015 0425   GLUCOSE 160* 01/11/2015 1211   GLUCOSE 128* 01/07/2015 0425   BUN 36.0* 01/11/2015 1211   BUN 34* 01/07/2015 0425   CREATININE 1.5* 01/11/2015 1211   CREATININE 1.36* 01/07/2015 0425   CALCIUM 8.6 01/11/2015 1211   CALCIUM 8.7* 01/07/2015 0425   PROT 7.1 01/11/2015 1211   PROT 7.5 01/05/2015 1035   ALBUMIN 2.9* 01/11/2015 1211   ALBUMIN 2.8* 01/05/2015 1035   AST 13 01/11/2015 1211   AST 22 01/05/2015 1035   ALT 23 01/11/2015 1211   ALT 25  01/05/2015 1035   ALKPHOS 135 01/11/2015 1211   ALKPHOS 109 01/05/2015 1035   BILITOT 0.73 01/11/2015 1211   BILITOT 0.8 01/05/2015 1035   GFRNONAA 45* 01/07/2015 0425   GFRAA 52* 01/07/2015 0425    No results found for: SPEP, UPEP  Lab Results  Component Value Date   WBC 6.0 01/11/2015   NEUTROABS 4.7 01/11/2015   HGB 9.6* 01/11/2015   HCT 28.3* 01/11/2015   MCV 102.5* 01/11/2015   PLT 127* 01/11/2015      Chemistry      Component Value Date/Time   NA 140 01/11/2015 1211   NA 140 01/07/2015 0425   K 3.8 01/11/2015 1211   K 3.9 01/07/2015 0425   CL 106 01/07/2015 0425   CO2 25 01/11/2015 1211   CO2 26 01/07/2015 0425   BUN 36.0* 01/11/2015 1211   BUN 34* 01/07/2015 0425   CREATININE 1.5* 01/11/2015 1211   CREATININE 1.36* 01/07/2015 0425  Component Value Date/Time   CALCIUM 8.6 01/11/2015 1211   CALCIUM 8.7* 01/07/2015 0425   ALKPHOS 135 01/11/2015 1211   ALKPHOS 109 01/05/2015 1035   AST 13 01/11/2015 1211   AST 22 01/05/2015 1035   ALT 23 01/11/2015 1211   ALT 25 01/05/2015 1035   BILITOT 0.73 01/11/2015 1211   BILITOT 0.8 01/05/2015 1035      ASSESSMENT & PLAN:  Multiple myeloma ([place]) He is doing well and appears to be responding to treatment. I will pursue treatment without further dose adjustment.  Chronic kidney disease, stage IV (severe) ([place]) He has significant improvement of his renal function which indicated response to treatment. We'll continue the same without dose adjustment.  Chronic lower back pain I suspect the back pain is from recent fracture and could be exacerbated by recent diagnosis of multiple myeloma. Low-dose dexamethasone daily should help. He has not taken any oxycodone. I will reassess his back pain in the next visit There is no indication for kyphoplasty right now.   Pancytopenia due to chemotherapy [facility]) This is likely due to recent treatment. The patient denies recent history of bleeding such as epistaxis,  hematuria or hematochezia. He is asymptomatic from the anemia or thrombocytopenia. I will observe for now.  He does not require transfusion now. I will continue the chemotherapy at current dose without dosage adjustment.  If the anemia gets progressive worse in the future, I might have to delay his treatment or adjust the chemotherapy dose.    Orders Placed This Encounter  Procedures  . SPEP & IFE with QIG    Standing Status: Future     Number of Occurrences:      Standing Expiration Date: 02/15/2016  . Kappa/lambda light chains    Standing Status: Future     Number of Occurrences:      Standing Expiration Date: 02/15/2016   All questions were answered. The patient knows to call the clinic with any problems, questions or concerns. No barriers to learning was detected. I spent 20 minutes counseling the patient face to face. The total time spent in the appointment was 25 minutes and more than 50% was on counseling and review of test results     [facility], [place], MD 01/12/2015 7:25 AM

## 2015-01-12 NOTE — Assessment & Plan Note (Signed)
He is doing well and appears to be responding to treatment. I will pursue treatment without further dose adjustment.

## 2015-01-12 NOTE — Assessment & Plan Note (Signed)
I suspect the back pain is from recent fracture and could be exacerbated by recent diagnosis of multiple myeloma. Low-dose dexamethasone daily should help. He has not taken any oxycodone. I will reassess his back pain in the next visit There is no indication for kyphoplasty right now.

## 2015-01-12 NOTE — Assessment & Plan Note (Signed)
This is likely due to recent treatment. The patient denies recent history of bleeding such as epistaxis, hematuria or hematochezia. He is asymptomatic from the anemia or thrombocytopenia. I will observe for now.  He does not require transfusion now. I will continue the chemotherapy at current dose without dosage adjustment.  If the anemia gets progressive worse in the future, I might have to delay his treatment or adjust the chemotherapy dose.

## 2015-01-14 ENCOUNTER — Ambulatory Visit (HOSPITAL_BASED_OUTPATIENT_CLINIC_OR_DEPARTMENT_OTHER): Payer: Medicare Other

## 2015-01-14 VITALS — BP 120/56 | HR 57 | Temp 97.7°F | Resp 16

## 2015-01-14 DIAGNOSIS — C9000 Multiple myeloma not having achieved remission: Secondary | ICD-10-CM | POA: Diagnosis not present

## 2015-01-14 DIAGNOSIS — Z5112 Encounter for antineoplastic immunotherapy: Secondary | ICD-10-CM | POA: Diagnosis present

## 2015-01-14 MED ORDER — ONDANSETRON HCL 8 MG PO TABS
ORAL_TABLET | ORAL | Status: AC
  Filled 2015-01-14: qty 1

## 2015-01-14 MED ORDER — ONDANSETRON HCL 8 MG PO TABS
8.0000 mg | ORAL_TABLET | Freq: Once | ORAL | Status: AC
  Administered 2015-01-14: 8 mg via ORAL

## 2015-01-14 MED ORDER — BORTEZOMIB CHEMO SQ INJECTION 3.5 MG (2.5MG/ML)
1.3000 mg/m2 | Freq: Once | INTRAMUSCULAR | Status: AC
  Administered 2015-01-14: 2.5 mg via SUBCUTANEOUS
  Filled 2015-01-14: qty 2.5

## 2015-01-14 NOTE — Patient Instructions (Signed)
[facility] Discharge Instructions for Patients Receiving Chemotherapy  Today you received the following chemotherapy agents Velcade. To help prevent nausea and vomiting after your treatment, we encourage you to take your nausea medication as directed.  If you develop nausea and vomiting that is not controlled by your nausea medication, call the clinic.   BELOW ARE SYMPTOMS THAT SHOULD BE REPORTED IMMEDIATELY:  *FEVER GREATER THAN 100.5 F  *CHILLS WITH OR WITHOUT FEVER  NAUSEA AND VOMITING THAT IS NOT CONTROLLED WITH YOUR NAUSEA MEDICATION  *UNUSUAL SHORTNESS OF BREATH  *UNUSUAL BRUISING OR BLEEDING  TENDERNESS IN MOUTH AND THROAT WITH OR WITHOUT PRESENCE OF ULCERS  *URINARY PROBLEMS  *BOWEL PROBLEMS  UNUSUAL RASH Items with * indicate a potential emergency and should be followed up as soon as possible.  Feel free to call the clinic you have any questions or concerns. The clinic phone number is [phone].  Please show the CHEMO ALERT CARD at check-in to the Emergency Department and triage nurse.    

## 2015-01-18 ENCOUNTER — Telehealth: Payer: Self-pay | Admitting: *Deleted

## 2015-01-18 ENCOUNTER — Ambulatory Visit: Payer: Medicare Other

## 2015-01-18 ENCOUNTER — Other Ambulatory Visit (HOSPITAL_BASED_OUTPATIENT_CLINIC_OR_DEPARTMENT_OTHER): Payer: Medicare Other

## 2015-01-18 DIAGNOSIS — N179 Acute kidney failure, unspecified: Secondary | ICD-10-CM

## 2015-01-18 DIAGNOSIS — C9000 Multiple myeloma not having achieved remission: Secondary | ICD-10-CM | POA: Diagnosis present

## 2015-01-18 LAB — COMPREHENSIVE METABOLIC PANEL (CC13)
ALT: 31 U/L (ref 0–55)
AST: 15 U/L (ref 5–34)
Albumin: 3 g/dL — ABNORMAL LOW (ref 3.5–5.0)
Alkaline Phosphatase: 147 U/L (ref 40–150)
Anion Gap: 9 mEq/L (ref 3–11)
BILIRUBIN TOTAL: 0.48 mg/dL (ref 0.20–1.20)
BUN: 38.4 mg/dL — AB (ref 7.0–26.0)
CO2: 25 meq/L (ref 22–29)
Calcium: 8.8 mg/dL (ref 8.4–10.4)
Chloride: 106 mEq/L (ref 98–109)
Creatinine: 1.5 mg/dL — ABNORMAL HIGH (ref 0.7–1.3)
EGFR: 46 mL/min/{1.73_m2} — AB (ref >90)
GLUCOSE: 168 mg/dL — AB (ref 70–140)
POTASSIUM: 4.1 meq/L (ref 3.5–5.1)
SODIUM: 141 meq/L (ref 136–145)
TOTAL PROTEIN: 6.5 g/dL (ref 6.4–8.3)

## 2015-01-18 LAB — CBC WITH DIFFERENTIAL/PLATELET
BASO%: 0 % (ref 0.0–2.0)
Basophils Absolute: 0 10*3/uL (ref 0.0–0.1)
EOS%: 0 % (ref 0.0–7.0)
Eosinophils Absolute: 0 10*3/uL (ref 0.0–0.5)
HCT: 30 % — ABNORMAL LOW (ref 38.4–49.9)
HEMOGLOBIN: 10.1 g/dL — AB (ref 13.0–17.1)
LYMPH%: 4 % — AB (ref 14.0–49.0)
MCH: 34.4 pg — ABNORMAL HIGH (ref 27.2–33.4)
MCHC: 33.7 g/dL (ref 32.0–36.0)
MCV: 102 fL — AB (ref 79.3–98.0)
MONO#: 0.4 10*3/uL (ref 0.1–0.9)
MONO%: 7.3 % (ref 0.0–14.0)
NEUT#: 5.4 10*3/uL (ref 1.5–6.5)
NEUT%: 88.7 % — AB (ref 39.0–75.0)
Platelets: 62 10*3/uL — ABNORMAL LOW (ref 140–400)
RBC: 2.94 10*6/uL — AB (ref 4.20–5.82)
RDW: 18.2 % — ABNORMAL HIGH (ref 11.0–14.6)
WBC: 6.1 10*3/uL (ref 4.0–10.3)
lymph#: 0.2 10*3/uL — ABNORMAL LOW (ref 0.9–3.3)
nRBC: 2 % — ABNORMAL HIGH (ref 0–0)

## 2015-01-18 NOTE — Progress Notes (Signed)
CBC reviewed by Dr. Alvy Bimler: Order received to HOLD CHEMO this week. Have pt HOLD Revlimid and follow up as scheduled 02/01/15. Spoke with patient and wife. Instructions given to HOLD Revlimid until next visit.  Wife voiced understanding, asked appropriate questions. She reports Revlimid should be delivered Thursday, she understands to hold on to it until 11/8 office visit. Wife reports pt was unusually weak this morning, almost fell during bathing and dressing (caregiver was present/ assisting.) Pt has been able to participate in PT/OT.  HGB better this week, explained treatment break may make him feel better. Instructed her to call the office for bleeding or any concerns.

## 2015-01-18 NOTE — Telephone Encounter (Signed)
Pt left VM states due to start Revlimid Thursday but has not heard from pharmacy yet.  I called [facility] and they state they will contact pt today or tomorrow to arrange delivery but pt can call them if he wants..  I called pt back and informed him of above. He will wait for their call.

## 2015-01-20 LAB — SPEP & IFE WITH QIG
ABNORMAL PROTEIN BAND3: NOT DETECTED g/dL
Abnormal Protein Band1: 0.8 g/dL
Abnormal Protein Band2: 0.7 g/dL
Albumin ELP: 3.2 g/dL — ABNORMAL LOW (ref 3.8–4.8)
Alpha-1-Globulin: 0.2 g/dL (ref 0.2–0.3)
Alpha-2-Globulin: 0.5 g/dL (ref 0.5–0.9)
BETA GLOBULIN: 0.4 g/dL (ref 0.4–0.6)
Beta 2: 1.8 g/dL — ABNORMAL HIGH (ref 0.2–0.5)
Gamma Globulin: 0.2 g/dL — ABNORMAL LOW (ref 0.8–1.7)
IGA: 1530 mg/dL — AB (ref 68–379)
IGM, SERUM: 24 mg/dL — AB (ref 41–251)
IgG (Immunoglobin G), Serum: 348 mg/dL — ABNORMAL LOW (ref 650–1600)
Total Protein, Serum Electrophoresis: 6.4 g/dL (ref 6.1–8.1)

## 2015-01-20 LAB — KAPPA/LAMBDA LIGHT CHAINS
KAPPA FREE LGHT CHN: 3.93 mg/dL — AB (ref 0.33–1.94)
Kappa:Lambda Ratio: 7.56 — ABNORMAL HIGH (ref 0.26–1.65)
Lambda Free Lght Chn: 0.52 mg/dL — ABNORMAL LOW (ref 0.57–2.63)

## 2015-01-21 ENCOUNTER — Ambulatory Visit: Payer: Medicare Other

## 2015-01-25 ENCOUNTER — Telehealth: Payer: Self-pay | Admitting: *Deleted

## 2015-01-25 ENCOUNTER — Other Ambulatory Visit: Payer: Self-pay

## 2015-01-25 NOTE — Telephone Encounter (Signed)
Pt left a message wanting to know if he should refill his hydralazine 10mg  ?   NO, per Dr Alvy Bimler Pt notified.

## 2015-02-01 ENCOUNTER — Other Ambulatory Visit (HOSPITAL_BASED_OUTPATIENT_CLINIC_OR_DEPARTMENT_OTHER): Payer: Medicare Other

## 2015-02-01 ENCOUNTER — Telehealth: Payer: Self-pay | Admitting: Hematology and Oncology

## 2015-02-01 ENCOUNTER — Ambulatory Visit (HOSPITAL_BASED_OUTPATIENT_CLINIC_OR_DEPARTMENT_OTHER): Payer: Medicare Other

## 2015-02-01 ENCOUNTER — Ambulatory Visit (HOSPITAL_BASED_OUTPATIENT_CLINIC_OR_DEPARTMENT_OTHER): Payer: Medicare Other | Admitting: Hematology and Oncology

## 2015-02-01 ENCOUNTER — Encounter: Payer: Self-pay | Admitting: Hematology and Oncology

## 2015-02-01 VITALS — BP 101/58 | HR 66 | Temp 98.1°F | Resp 20 | Ht 67.0 in | Wt 147.0 lb

## 2015-02-01 DIAGNOSIS — E46 Unspecified protein-calorie malnutrition: Secondary | ICD-10-CM

## 2015-02-01 DIAGNOSIS — C9000 Multiple myeloma not having achieved remission: Secondary | ICD-10-CM

## 2015-02-01 DIAGNOSIS — D61810 Antineoplastic chemotherapy induced pancytopenia: Secondary | ICD-10-CM | POA: Diagnosis not present

## 2015-02-01 DIAGNOSIS — Z5112 Encounter for antineoplastic immunotherapy: Secondary | ICD-10-CM

## 2015-02-01 DIAGNOSIS — M545 Low back pain, unspecified: Secondary | ICD-10-CM

## 2015-02-01 DIAGNOSIS — G8929 Other chronic pain: Secondary | ICD-10-CM

## 2015-02-01 DIAGNOSIS — I160 Hypertensive urgency: Secondary | ICD-10-CM | POA: Diagnosis not present

## 2015-02-01 DIAGNOSIS — N184 Chronic kidney disease, stage 4 (severe): Secondary | ICD-10-CM

## 2015-02-01 DIAGNOSIS — N179 Acute kidney failure, unspecified: Secondary | ICD-10-CM

## 2015-02-01 LAB — COMPREHENSIVE METABOLIC PANEL (CC13)
ALBUMIN: 3 g/dL — AB (ref 3.5–5.0)
ALK PHOS: 151 U/L — AB (ref 40–150)
ALT: 90 U/L — AB (ref 0–55)
ANION GAP: 10 meq/L (ref 3–11)
AST: 24 U/L (ref 5–34)
BUN: 37.2 mg/dL — AB (ref 7.0–26.0)
CALCIUM: 8.9 mg/dL (ref 8.4–10.4)
CHLORIDE: 104 meq/L (ref 98–109)
CO2: 26 mEq/L (ref 22–29)
CREATININE: 1.3 mg/dL (ref 0.7–1.3)
EGFR: 58 mL/min/{1.73_m2} — ABNORMAL LOW (ref >90)
Glucose: 177 mg/dl — ABNORMAL HIGH (ref 70–140)
Potassium: 4.1 mEq/L (ref 3.5–5.1)
Sodium: 140 mEq/L (ref 136–145)
Total Bilirubin: 0.65 mg/dL (ref 0.20–1.20)
Total Protein: 6.2 g/dL — ABNORMAL LOW (ref 6.4–8.3)

## 2015-02-01 LAB — CBC WITH DIFFERENTIAL/PLATELET
BASO%: 0.1 % (ref 0.0–2.0)
BASOS ABS: 0 10*3/uL (ref 0.0–0.1)
EOS%: 0.2 % (ref 0.0–7.0)
Eosinophils Absolute: 0 10*3/uL (ref 0.0–0.5)
HEMATOCRIT: 32.5 % — AB (ref 38.4–49.9)
HEMOGLOBIN: 10.8 g/dL — AB (ref 13.0–17.1)
LYMPH#: 0.2 10*3/uL — AB (ref 0.9–3.3)
LYMPH%: 7.2 % — ABNORMAL LOW (ref 14.0–49.0)
MCH: 34.8 pg — AB (ref 27.2–33.4)
MCHC: 33.2 g/dL (ref 32.0–36.0)
MCV: 104.9 fL — ABNORMAL HIGH (ref 79.3–98.0)
MONO#: 0.2 10*3/uL (ref 0.1–0.9)
MONO%: 6.2 % (ref 0.0–14.0)
NEUT#: 2.6 10*3/uL (ref 1.5–6.5)
NEUT%: 86.3 % — AB (ref 39.0–75.0)
PLATELETS: 137 10*3/uL — AB (ref 140–400)
RBC: 3.1 10*6/uL — ABNORMAL LOW (ref 4.20–5.82)
RDW: 16.5 % — AB (ref 11.0–14.6)
WBC: 3 10*3/uL — ABNORMAL LOW (ref 4.0–10.3)

## 2015-02-01 LAB — TECHNOLOGIST REVIEW

## 2015-02-01 MED ORDER — ONDANSETRON HCL 8 MG PO TABS
ORAL_TABLET | ORAL | Status: AC
  Filled 2015-02-01: qty 1

## 2015-02-01 MED ORDER — BORTEZOMIB CHEMO SQ INJECTION 3.5 MG (2.5MG/ML)
1.3000 mg/m2 | Freq: Once | INTRAMUSCULAR | Status: AC
  Administered 2015-02-01: 2.5 mg via SUBCUTANEOUS
  Filled 2015-02-01: qty 2.5

## 2015-02-01 MED ORDER — ONDANSETRON HCL 8 MG PO TABS
8.0000 mg | ORAL_TABLET | Freq: Once | ORAL | Status: AC
  Administered 2015-02-01: 8 mg via ORAL

## 2015-02-01 NOTE — Assessment & Plan Note (Signed)
This is likely due to recent treatment. The patient denies recent history of bleeding such as epistaxis, hematuria or hematochezia. He is asymptomatic from the anemia or thrombocytopenia. I will observe for now.  He does not require transfusion now. I will continue the chemotherapy at current dose without dosage adjustment.   in the meantime, I will continue to hold Revlimid.

## 2015-02-01 NOTE — Assessment & Plan Note (Signed)
His lower back pain continues to improve. He has not needed to take any pain medicine recently.  we will continue close monitoring.

## 2015-02-01 NOTE — Assessment & Plan Note (Signed)
The patient had excellent response to treatment with near normalization of his serum light chain. I will continue to hold Revlimid for now due to recent pancytopenia. I will proceed with Velcade treatment. I will see him back next month for further treatment and assessment

## 2015-02-01 NOTE — Assessment & Plan Note (Signed)
The patient is noted to have protein calorie malnutrition. He has lost some weight recently. I will hold Revlimid due to recent weight loss and pancytopenia. Encouraged the patient to increase oral intake as tolerated

## 2015-02-01 NOTE — Assessment & Plan Note (Signed)
His kidney function has fluctuated. I see some improvement of his kidney function likely related to improvement of multiple myeloma control. We will continue to observe closely.

## 2015-02-01 NOTE — Assessment & Plan Note (Signed)
When the patient was diagnosed with multiple myeloma, he had poorly controlled hypertension. Since recovery of his kidney function, his blood pressure is now running low. I recommend him to hold off taking hydralazine and amlodipine but to continue metoprolol for now.

## 2015-02-01 NOTE — Telephone Encounter (Signed)
Gave patient relative avs report and appointments for November and December.

## 2015-02-01 NOTE — Progress Notes (Signed)
[place] OFFICE PROGRESS NOTE  Patient Care Team: Lajean Manes, MD as PCP - General (Internal Medicine)  SUMMARY OF ONCOLOGIC HISTORY:   Multiple myeloma ([place])   12/07/2014 - 12/14/2014 Hospital Admission The patient was hospitalized for hypertensive urgency and renal failure. He was found to have multiple myeloma   12/08/2014 Imaging Skeletal survey showed old compression fracture without new bone lytic lesion   12/10/2014 Bone Marrow Biopsy [id number] bone marrow biopsy showed 69% plasma cell involvement   12/21/2014 -  Chemotherapy He was started on dexamethasone and Velcade   01/05/2015 - 01/07/2015 Hospital Admission The patient was hospitalized for altered mental status.   01/25/2015 Adverse Reaction Due to pancytopenia, Revlimid is placed on hold    INTERVAL HISTORY: Please see below for problem oriented charting.  the patient has lost some weight recently. His back pain has resolved. He feels well. Denies recent headache. No new focal neurological deficit.  he denies side effects on neuropathy from treatment. No recent infection  REVIEW OF SYSTEMS:   Constitutional: Denies fevers, chills or abnormal weight loss Eyes: Denies blurriness of vision Ears, nose, mouth, throat, and face: Denies mucositis or sore throat Respiratory: Denies cough, dyspnea or wheezes Cardiovascular: Denies palpitation, chest discomfort or lower extremity swelling Gastrointestinal:  Denies nausea, heartburn or change in bowel habits Skin: Denies abnormal skin rashes Lymphatics: Denies new lymphadenopathy or easy bruising Neurological:Denies numbness, tingling or new weaknesses Behavioral/Psych: Mood is stable, no new changes  All other systems were reviewed with the patient and are negative.  I have reviewed the past medical history, past surgical history, social history and family history with the patient and they are unchanged from previous note.  ALLERGIES:  is allergic to bee  venom; oxycodone; and tramadol.  MEDICATIONS:  Current Outpatient Prescriptions  Medication Sig Dispense Refill  . acetaminophen (TYLENOL) 500 MG tablet Take 1,000 mg by mouth every 6 (six) hours as needed for moderate pain.    Marland Kitchen acyclovir (ZOVIRAX) 400 MG tablet Take 1 tablet (400 mg total) by mouth daily. 60 tablet 3  . amLODipine (NORVASC) 10 MG tablet Take 10 mg by mouth daily.    . Ascorbic Acid (VITAMIN C) 1000 MG tablet Take 1,000 mg by mouth daily.    Marland Kitchen aspirin 81 MG tablet Take 81 mg by mouth daily.    Marland Kitchen dexamethasone (DECADRON) 4 MG tablet Take 1 tablet (4 mg total) by mouth daily. 60 tablet 0  . donepezil (ARICEPT) 10 MG tablet Take 10 mg by mouth at bedtime.    Marland Kitchen doxazosin (CARDURA) 8 MG tablet Take 8 mg by mouth daily.  3  . hydrALAZINE (APRESOLINE) 10 MG tablet Take 1 tablet (10 mg total) by mouth every 8 (eight) hours. (Patient taking differently: Take 10 mg by mouth 3 (three) times daily. ) 90 tablet 0  . memantine (NAMENDA) 10 MG tablet Take 10 mg by mouth 2 (two) times daily.    . metoprolol tartrate (LOPRESSOR) 25 MG tablet Take 25 mg by mouth 2 (two) times daily.    . Multiple Vitamin (MULTIVITAMIN WITH MINERALS) TABS tablet Take 1 tablet by mouth daily.    . Omega-3 Fatty Acids (FISH OIL) 1000 MG CAPS Take 1 capsule by mouth daily.    . polyethylene glycol (MIRALAX / GLYCOLAX) packet Take 17 g by mouth daily as needed.    Marland Kitchen PRESCRIPTION MEDICATION Chemo - [facility]    . lenalidomide (REVLIMID) 2.5 MG capsule 1 capsule daily for 14 days then  rest 7 days (Patient not taking: Reported on 02/01/2015) 14 capsule 0  . ondansetron (ZOFRAN) 8 MG tablet Take 1 tablet (8 mg total) by mouth every 8 (eight) hours as needed for nausea. (Patient not taking: Reported on 01/11/2015) 30 tablet 1  . oxyCODONE (OXY IR/ROXICODONE) 5 MG immediate release tablet Take 1 tablet (5 mg total) by mouth every 4 (four) hours as needed for moderate pain. (Patient not taking: Reported on 01/05/2015) 30 tablet  0  . prochlorperazine (COMPAZINE) 10 MG tablet Take 1 tablet (10 mg total) by mouth every 6 (six) hours as needed (Nausea or vomiting). (Patient not taking: Reported on 01/11/2015) 30 tablet 1   No current facility-administered medications for this visit.    PHYSICAL EXAMINATION: ECOG PERFORMANCE STATUS: 2 - Symptomatic, <50% confined to bed  Filed Vitals:   02/01/15 1254  BP: 101/58  Pulse: 66  Temp: 98.1 F (36.7 C)  Resp: 20   Filed Weights   02/01/15 1254  Weight: 147 lb (66.679 kg)    GENERAL:alert, no distress and comfortable SKIN: skin color, texture, turgor are normal, no rashes or significant lesions EYES: normal, Conjunctiva are pink and non-injected, sclera clear OROPHARYNX:no exudate, no erythema and lips, buccal mucosa, and tongue normal  NECK: supple, thyroid normal size, non-tender, without nodularity LYMPH:  no palpable lymphadenopathy in the cervical, axillary or inguinal LUNGS: clear to auscultation and percussion with normal breathing effort HEART: regular rate & rhythm and no murmurs and no lower extremity edema ABDOMEN:abdomen soft, non-tender and normal bowel sounds Musculoskeletal:no cyanosis of digits and no clubbing  NEURO: alert & oriented x 3 with fluent speech, no focal motor/sensory deficits  LABORATORY DATA:  I have reviewed the data as listed    Component Value Date/Time   NA 140 02/01/2015 1240   NA 140 01/07/2015 0425   K 4.1 02/01/2015 1240   K 3.9 01/07/2015 0425   CL 106 01/07/2015 0425   CO2 26 02/01/2015 1240   CO2 26 01/07/2015 0425   GLUCOSE 177* 02/01/2015 1240   GLUCOSE 128* 01/07/2015 0425   BUN 37.2* 02/01/2015 1240   BUN 34* 01/07/2015 0425   CREATININE 1.3 02/01/2015 1240   CREATININE 1.36* 01/07/2015 0425   CALCIUM 8.9 02/01/2015 1240   CALCIUM 8.7* 01/07/2015 0425   PROT 6.2* 02/01/2015 1240   PROT 7.5 01/05/2015 1035   ALBUMIN 3.0* 02/01/2015 1240   ALBUMIN 2.8* 01/05/2015 1035   AST 24 02/01/2015 1240   AST 22  01/05/2015 1035   ALT 90* 02/01/2015 1240   ALT 25 01/05/2015 1035   ALKPHOS 151* 02/01/2015 1240   ALKPHOS 109 01/05/2015 1035   BILITOT 0.65 02/01/2015 1240   BILITOT 0.8 01/05/2015 1035   GFRNONAA 45* 01/07/2015 0425   GFRAA 52* 01/07/2015 0425    No results found for: SPEP, UPEP  Lab Results  Component Value Date   WBC 3.0* 02/01/2015   NEUTROABS 2.6 02/01/2015   HGB 10.8* 02/01/2015   HCT 32.5* 02/01/2015   MCV 104.9* 02/01/2015   PLT 137* 02/01/2015      Chemistry      Component Value Date/Time   NA 140 02/01/2015 1240   NA 140 01/07/2015 0425   K 4.1 02/01/2015 1240   K 3.9 01/07/2015 0425   CL 106 01/07/2015 0425   CO2 26 02/01/2015 1240   CO2 26 01/07/2015 0425   BUN 37.2* 02/01/2015 1240   BUN 34* 01/07/2015 0425   CREATININE 1.3 02/01/2015 1240   CREATININE  1.36* 01/07/2015 0425      Component Value Date/Time   CALCIUM 8.9 02/01/2015 1240   CALCIUM 8.7* 01/07/2015 0425   ALKPHOS 151* 02/01/2015 1240   ALKPHOS 109 01/05/2015 1035   AST 24 02/01/2015 1240   AST 22 01/05/2015 1035   ALT 90* 02/01/2015 1240   ALT 25 01/05/2015 1035   BILITOT 0.65 02/01/2015 1240   BILITOT 0.8 01/05/2015 1035      ASSESSMENT & PLAN:  Multiple myeloma ([place]) The patient had excellent response to treatment with near normalization of his serum light chain. I will continue to hold Revlimid for now due to recent pancytopenia. I will proceed with Velcade treatment. I will see him back next month for further treatment and assessment  Pancytopenia due to chemotherapy [facility]) This is likely due to recent treatment. The patient denies recent history of bleeding such as epistaxis, hematuria or hematochezia. He is asymptomatic from the anemia or thrombocytopenia. I will observe for now.  He does not require transfusion now. I will continue the chemotherapy at current dose without dosage adjustment.   in the meantime, I will continue to hold Revlimid.  Chronic lower back pain   His lower back pain continues to improve. He has not needed to take any pain medicine recently.  we will continue close monitoring.  Chronic kidney disease, stage IV (severe) ([place])  His kidney function has fluctuated. I see some improvement of his kidney function likely related to improvement of multiple myeloma control. We will continue to observe closely.  Protein calorie malnutrition ([place])  The patient is noted to have protein calorie malnutrition. He has lost some weight recently. I will hold Revlimid due to recent weight loss and pancytopenia. Encouraged the patient to increase oral intake as tolerated  Hypertensive urgency  When the patient was diagnosed with multiple myeloma, he had poorly controlled hypertension. Since recovery of his kidney function, his blood pressure is now running low. I recommend him to hold off taking hydralazine and amlodipine but to continue metoprolol for now.   No orders of the defined types were placed in this encounter.   All questions were answered. The patient knows to call the clinic with any problems, questions or concerns. No barriers to learning was detected. I spent 25 minutes counseling the patient face to face. The total time spent in the appointment was 40 minutes and more than 50% was on counseling and review of test results     [facility], [place], MD 02/01/2015 1:23 PM

## 2015-02-03 ENCOUNTER — Other Ambulatory Visit: Payer: Self-pay | Admitting: *Deleted

## 2015-02-04 ENCOUNTER — Ambulatory Visit (HOSPITAL_BASED_OUTPATIENT_CLINIC_OR_DEPARTMENT_OTHER): Payer: Medicare Other

## 2015-02-04 VITALS — BP 115/59 | HR 60 | Temp 98.4°F | Resp 20

## 2015-02-04 DIAGNOSIS — C9000 Multiple myeloma not having achieved remission: Secondary | ICD-10-CM

## 2015-02-04 DIAGNOSIS — Z5112 Encounter for antineoplastic immunotherapy: Secondary | ICD-10-CM

## 2015-02-04 MED ORDER — ONDANSETRON HCL 8 MG PO TABS
8.0000 mg | ORAL_TABLET | Freq: Once | ORAL | Status: AC
  Administered 2015-02-04: 8 mg via ORAL

## 2015-02-04 MED ORDER — BORTEZOMIB CHEMO SQ INJECTION 3.5 MG (2.5MG/ML)
1.3000 mg/m2 | Freq: Once | INTRAMUSCULAR | Status: AC
  Administered 2015-02-04: 2.5 mg via SUBCUTANEOUS
  Filled 2015-02-04: qty 2.5

## 2015-02-04 MED ORDER — ONDANSETRON HCL 8 MG PO TABS
ORAL_TABLET | ORAL | Status: AC
  Filled 2015-02-04: qty 1

## 2015-02-04 NOTE — Patient Instructions (Signed)
[facility] Discharge Instructions for Patients Receiving Chemotherapy  Today you received the following chemotherapy agents Velcade  To help prevent nausea and vomiting after your treatment, we encourage you to take your nausea medication    If you develop nausea and vomiting that is not controlled by your nausea medication, call the clinic.   BELOW ARE SYMPTOMS THAT SHOULD BE REPORTED IMMEDIATELY:  *FEVER GREATER THAN 100.5 F  *CHILLS WITH OR WITHOUT FEVER  NAUSEA AND VOMITING THAT IS NOT CONTROLLED WITH YOUR NAUSEA MEDICATION  *UNUSUAL SHORTNESS OF BREATH  *UNUSUAL BRUISING OR BLEEDING  TENDERNESS IN MOUTH AND THROAT WITH OR WITHOUT PRESENCE OF ULCERS  *URINARY PROBLEMS  *BOWEL PROBLEMS  UNUSUAL RASH Items with * indicate a potential emergency and should be followed up as soon as possible.  Feel free to call the clinic you have any questions or concerns. The clinic phone number is [phone].  Please show the CHEMO ALERT CARD at check-in to the Emergency Department and triage nurse.   

## 2015-02-08 ENCOUNTER — Other Ambulatory Visit (HOSPITAL_BASED_OUTPATIENT_CLINIC_OR_DEPARTMENT_OTHER): Payer: Medicare Other

## 2015-02-08 ENCOUNTER — Ambulatory Visit (HOSPITAL_BASED_OUTPATIENT_CLINIC_OR_DEPARTMENT_OTHER): Payer: Medicare Other

## 2015-02-08 VITALS — BP 136/66 | HR 64 | Temp 97.6°F | Resp 18

## 2015-02-08 DIAGNOSIS — C9000 Multiple myeloma not having achieved remission: Secondary | ICD-10-CM | POA: Diagnosis present

## 2015-02-08 DIAGNOSIS — Z5112 Encounter for antineoplastic immunotherapy: Secondary | ICD-10-CM | POA: Diagnosis present

## 2015-02-08 DIAGNOSIS — N179 Acute kidney failure, unspecified: Secondary | ICD-10-CM

## 2015-02-08 LAB — CBC WITH DIFFERENTIAL/PLATELET
BASO%: 0 % (ref 0.0–2.0)
Basophils Absolute: 0 10*3/uL (ref 0.0–0.1)
EOS ABS: 0 10*3/uL (ref 0.0–0.5)
EOS%: 0.1 % (ref 0.0–7.0)
HEMATOCRIT: 34.3 % — AB (ref 38.4–49.9)
HEMOGLOBIN: 11.7 g/dL — AB (ref 13.0–17.1)
LYMPH%: 6 % — ABNORMAL LOW (ref 14.0–49.0)
MCH: 34.7 pg — ABNORMAL HIGH (ref 27.2–33.4)
MCHC: 34.1 g/dL (ref 32.0–36.0)
MCV: 101.8 fL — AB (ref 79.3–98.0)
MONO#: 0.6 10*3/uL (ref 0.1–0.9)
MONO%: 6.7 % (ref 0.0–14.0)
NEUT%: 87.2 % — ABNORMAL HIGH (ref 39.0–75.0)
NEUTROS ABS: 8.1 10*3/uL — AB (ref 1.5–6.5)
NRBC: 1 % — AB (ref 0–0)
PLATELETS: 70 10*3/uL — AB (ref 140–400)
RBC: 3.37 10*6/uL — ABNORMAL LOW (ref 4.20–5.82)
RDW: 15.4 % — AB (ref 11.0–14.6)
WBC: 9.2 10*3/uL (ref 4.0–10.3)
lymph#: 0.6 10*3/uL — ABNORMAL LOW (ref 0.9–3.3)

## 2015-02-08 LAB — COMPREHENSIVE METABOLIC PANEL (CC13)
ALBUMIN: 3.1 g/dL — AB (ref 3.5–5.0)
ALK PHOS: 137 U/L (ref 40–150)
ALT: 80 U/L — ABNORMAL HIGH (ref 0–55)
ANION GAP: 12 meq/L — AB (ref 3–11)
AST: 19 U/L (ref 5–34)
BILIRUBIN TOTAL: 0.58 mg/dL (ref 0.20–1.20)
BUN: 43.2 mg/dL — ABNORMAL HIGH (ref 7.0–26.0)
CALCIUM: 8.9 mg/dL (ref 8.4–10.4)
CO2: 24 mEq/L (ref 22–29)
CREATININE: 1.8 mg/dL — AB (ref 0.7–1.3)
Chloride: 101 mEq/L (ref 98–109)
EGFR: 38 mL/min/{1.73_m2} — AB (ref >90)
Glucose: 179 mg/dl — ABNORMAL HIGH (ref 70–140)
Potassium: 3.7 mEq/L (ref 3.5–5.1)
Sodium: 138 mEq/L (ref 136–145)
TOTAL PROTEIN: 6.2 g/dL — AB (ref 6.4–8.3)

## 2015-02-08 MED ORDER — ONDANSETRON HCL 8 MG PO TABS
8.0000 mg | ORAL_TABLET | Freq: Once | ORAL | Status: AC
  Administered 2015-02-08: 8 mg via ORAL

## 2015-02-08 MED ORDER — ONDANSETRON HCL 8 MG PO TABS
ORAL_TABLET | ORAL | Status: AC
  Filled 2015-02-08: qty 1

## 2015-02-08 MED ORDER — BORTEZOMIB CHEMO SQ INJECTION 3.5 MG (2.5MG/ML)
1.3000 mg/m2 | Freq: Once | INTRAMUSCULAR | Status: AC
  Administered 2015-02-08: 2.5 mg via SUBCUTANEOUS
  Filled 2015-02-08: qty 2.5

## 2015-02-08 NOTE — Patient Instructions (Signed)
[facility] Discharge Instructions for Patients Receiving Chemotherapy  Today you received the following chemotherapy agents Velcade. To help prevent nausea and vomiting after your treatment, we encourage you to take your nausea medication as directed.  If you develop nausea and vomiting that is not controlled by your nausea medication, call the clinic.   BELOW ARE SYMPTOMS THAT SHOULD BE REPORTED IMMEDIATELY:  *FEVER GREATER THAN 100.5 F  *CHILLS WITH OR WITHOUT FEVER  NAUSEA AND VOMITING THAT IS NOT CONTROLLED WITH YOUR NAUSEA MEDICATION  *UNUSUAL SHORTNESS OF BREATH  *UNUSUAL BRUISING OR BLEEDING  TENDERNESS IN MOUTH AND THROAT WITH OR WITHOUT PRESENCE OF ULCERS  *URINARY PROBLEMS  *BOWEL PROBLEMS  UNUSUAL RASH Items with * indicate a potential emergency and should be followed up as soon as possible.  Feel free to call the clinic you have any questions or concerns. The clinic phone number is [phone].  Please show the CHEMO ALERT CARD at check-in to the Emergency Department and triage nurse.    

## 2015-02-08 NOTE — Progress Notes (Signed)
Okay to treat despite today's labs per Dr. Alvy Bimler.   1340: Upon assessment of pt, pts family noted that pt has had increase in "tremors."  When asked to elaborate on these, family states pt has episodes of "semi-consciousness" where he goes completely out of it and if in motion has to be eased to the floor d/t mental capacity and weakness.  Pt family states these episodes happen 1-2 times per day and are starting to increase and worsen.  Pt family states they last about 30 minutes.  Cameo, RN notified who will convey message to Alvy Bimler and follow up as necessary.  Okay given to continue with Velcade SQ today but Revlimid continues to be held.  Family informed that MD notified and would follow up with them.  Pt and family discharged with no complaints or concerns.

## 2015-02-11 ENCOUNTER — Other Ambulatory Visit: Payer: Self-pay | Admitting: Hematology and Oncology

## 2015-02-11 ENCOUNTER — Encounter: Payer: Self-pay | Admitting: Hematology and Oncology

## 2015-02-11 ENCOUNTER — Ambulatory Visit (HOSPITAL_BASED_OUTPATIENT_CLINIC_OR_DEPARTMENT_OTHER): Payer: Medicare Other

## 2015-02-11 VITALS — BP 133/72 | HR 76 | Temp 97.1°F

## 2015-02-11 DIAGNOSIS — C9000 Multiple myeloma not having achieved remission: Secondary | ICD-10-CM

## 2015-02-11 DIAGNOSIS — Z5112 Encounter for antineoplastic immunotherapy: Secondary | ICD-10-CM

## 2015-02-11 DIAGNOSIS — R569 Unspecified convulsions: Secondary | ICD-10-CM

## 2015-02-11 HISTORY — DX: Unspecified convulsions: R56.9

## 2015-02-11 MED ORDER — ONDANSETRON HCL 8 MG PO TABS
8.0000 mg | ORAL_TABLET | Freq: Once | ORAL | Status: AC
  Administered 2015-02-11: 8 mg via ORAL

## 2015-02-11 MED ORDER — BORTEZOMIB CHEMO SQ INJECTION 3.5 MG (2.5MG/ML)
1.3000 mg/m2 | Freq: Once | INTRAMUSCULAR | Status: AC
  Administered 2015-02-11: 2.5 mg via SUBCUTANEOUS
  Filled 2015-02-11: qty 2.5

## 2015-02-11 MED ORDER — ONDANSETRON HCL 8 MG PO TABS
ORAL_TABLET | ORAL | Status: AC
  Filled 2015-02-11: qty 1

## 2015-02-11 NOTE — Patient Instructions (Signed)
[facility] Discharge Instructions for Patients Receiving Chemotherapy  Today you received the following chemotherapy agents:  Velcade  To help prevent nausea and vomiting after your treatment, we encourage you to take your nausea medication as prescribed.   If you develop nausea and vomiting that is not controlled by your nausea medication, call the clinic.   BELOW ARE SYMPTOMS THAT SHOULD BE REPORTED IMMEDIATELY:  *FEVER GREATER THAN 100.5 F  *CHILLS WITH OR WITHOUT FEVER  NAUSEA AND VOMITING THAT IS NOT CONTROLLED WITH YOUR NAUSEA MEDICATION  *UNUSUAL SHORTNESS OF BREATH  *UNUSUAL BRUISING OR BLEEDING  TENDERNESS IN MOUTH AND THROAT WITH OR WITHOUT PRESENCE OF ULCERS  *URINARY PROBLEMS  *BOWEL PROBLEMS  UNUSUAL RASH Items with * indicate a potential emergency and should be followed up as soon as possible.  Feel free to call the clinic you have any questions or concerns. The clinic phone number is [phone].  Please show the CHEMO ALERT CARD at check-in to the Emergency Department and triage nurse.   

## 2015-02-11 NOTE — Progress Notes (Signed)
I have received numerous report from the infusion nurses informing me that the wife has witnessed multiple spells of altered mental status. The patient also has recurrent admission to the hospital for altered mental status. I told my nurse to proceed with treatment as I do not think this is related to the treatment. I will refer the patient to neurology for further management

## 2015-02-11 NOTE — Progress Notes (Signed)
Patient's wife reports patient having "mini seizures" about 3 times a week since starting treatment. Patient becomes unresponsive for 20-30 minutes and then recovers. Discussed with Dr. Alvy Bimler and she will make a referral to neurology. Patient and wife advised of this.

## 2015-02-12 ENCOUNTER — Other Ambulatory Visit (HOSPITAL_COMMUNITY): Payer: Self-pay

## 2015-02-12 ENCOUNTER — Emergency Department (HOSPITAL_COMMUNITY): Payer: Medicare Other

## 2015-02-12 ENCOUNTER — Other Ambulatory Visit: Payer: Self-pay

## 2015-02-12 ENCOUNTER — Encounter (HOSPITAL_COMMUNITY): Payer: Self-pay | Admitting: Emergency Medicine

## 2015-02-12 ENCOUNTER — Inpatient Hospital Stay (HOSPITAL_COMMUNITY)
Admission: EM | Admit: 2015-02-12 | Discharge: 2015-02-16 | DRG: 309 | Disposition: A | Discharge status: Home Health | Payer: Medicare Other | Attending: Internal Medicine | Admitting: Internal Medicine

## 2015-02-12 DIAGNOSIS — Z66 Do not resuscitate: Secondary | ICD-10-CM | POA: Diagnosis present

## 2015-02-12 DIAGNOSIS — R55 Syncope and collapse: Secondary | ICD-10-CM | POA: Insufficient documentation

## 2015-02-12 DIAGNOSIS — M545 Low back pain: Secondary | ICD-10-CM | POA: Diagnosis present

## 2015-02-12 DIAGNOSIS — Z515 Encounter for palliative care: Secondary | ICD-10-CM

## 2015-02-12 DIAGNOSIS — R531 Weakness: Secondary | ICD-10-CM

## 2015-02-12 DIAGNOSIS — C9000 Multiple myeloma not having achieved remission: Secondary | ICD-10-CM | POA: Insufficient documentation

## 2015-02-12 DIAGNOSIS — F0390 Unspecified dementia without behavioral disturbance: Secondary | ICD-10-CM | POA: Diagnosis present

## 2015-02-12 DIAGNOSIS — R4702 Dysphasia: Secondary | ICD-10-CM | POA: Diagnosis present

## 2015-02-12 DIAGNOSIS — I129 Hypertensive chronic kidney disease with stage 1 through stage 4 chronic kidney disease, or unspecified chronic kidney disease: Secondary | ICD-10-CM | POA: Diagnosis present

## 2015-02-12 DIAGNOSIS — Z993 Dependence on wheelchair: Secondary | ICD-10-CM

## 2015-02-12 DIAGNOSIS — E876 Hypokalemia: Secondary | ICD-10-CM | POA: Diagnosis present

## 2015-02-12 DIAGNOSIS — D696 Thrombocytopenia, unspecified: Secondary | ICD-10-CM | POA: Diagnosis present

## 2015-02-12 DIAGNOSIS — D61818 Other pancytopenia: Secondary | ICD-10-CM | POA: Diagnosis present

## 2015-02-12 DIAGNOSIS — Z6823 Body mass index (BMI) 23.0-23.9, adult: Secondary | ICD-10-CM | POA: Diagnosis not present

## 2015-02-12 DIAGNOSIS — R569 Unspecified convulsions: Secondary | ICD-10-CM | POA: Diagnosis not present

## 2015-02-12 DIAGNOSIS — R627 Adult failure to thrive: Secondary | ICD-10-CM | POA: Diagnosis present

## 2015-02-12 DIAGNOSIS — N184 Chronic kidney disease, stage 4 (severe): Secondary | ICD-10-CM | POA: Diagnosis present

## 2015-02-12 DIAGNOSIS — E872 Acidosis, unspecified: Secondary | ICD-10-CM | POA: Insufficient documentation

## 2015-02-12 DIAGNOSIS — I959 Hypotension, unspecified: Secondary | ICD-10-CM | POA: Diagnosis present

## 2015-02-12 DIAGNOSIS — T380X5A Adverse effect of glucocorticoids and synthetic analogues, initial encounter: Secondary | ICD-10-CM | POA: Diagnosis present

## 2015-02-12 DIAGNOSIS — T451X5A Adverse effect of antineoplastic and immunosuppressive drugs, initial encounter: Secondary | ICD-10-CM | POA: Diagnosis present

## 2015-02-12 DIAGNOSIS — I4891 Unspecified atrial fibrillation: Principal | ICD-10-CM | POA: Diagnosis present

## 2015-02-12 DIAGNOSIS — I9589 Other hypotension: Secondary | ICD-10-CM | POA: Diagnosis not present

## 2015-02-12 DIAGNOSIS — I493 Ventricular premature depolarization: Secondary | ICD-10-CM | POA: Diagnosis not present

## 2015-02-12 DIAGNOSIS — Z79899 Other long term (current) drug therapy: Secondary | ICD-10-CM

## 2015-02-12 DIAGNOSIS — E46 Unspecified protein-calorie malnutrition: Secondary | ICD-10-CM | POA: Diagnosis present

## 2015-02-12 DIAGNOSIS — I480 Paroxysmal atrial fibrillation: Secondary | ICD-10-CM | POA: Diagnosis not present

## 2015-02-12 DIAGNOSIS — Z7982 Long term (current) use of aspirin: Secondary | ICD-10-CM

## 2015-02-12 LAB — CBC WITH DIFFERENTIAL/PLATELET
BASOS PCT: 0 %
Basophils Absolute: 0 10*3/uL (ref 0.0–0.1)
Eosinophils Absolute: 0 10*3/uL (ref 0.0–0.7)
Eosinophils Relative: 0 %
HEMATOCRIT: 31.9 % — AB (ref 39.0–52.0)
HEMOGLOBIN: 10.7 g/dL — AB (ref 13.0–17.0)
LYMPHS ABS: 0.5 10*3/uL — AB (ref 0.7–4.0)
Lymphocytes Relative: 7 %
MCH: 34.5 pg — ABNORMAL HIGH (ref 26.0–34.0)
MCHC: 33.5 g/dL (ref 30.0–36.0)
MCV: 102.9 fL — ABNORMAL HIGH (ref 78.0–100.0)
MONO ABS: 0.4 10*3/uL (ref 0.1–1.0)
Monocytes Relative: 6 %
Neutro Abs: 5.9 10*3/uL (ref 1.7–7.7)
Neutrophils Relative %: 87 %
Platelets: 37 10*3/uL — ABNORMAL LOW (ref 150–400)
RBC: 3.1 MIL/uL — AB (ref 4.22–5.81)
RDW: 15.5 % (ref 11.5–15.5)
WBC: 6.8 10*3/uL (ref 4.0–10.5)

## 2015-02-12 LAB — BASIC METABOLIC PANEL
Anion gap: 12 (ref 5–15)
BUN: 40 mg/dL — ABNORMAL HIGH (ref 6–20)
CALCIUM: 8.8 mg/dL — AB (ref 8.9–10.3)
CO2: 23 mmol/L (ref 22–32)
CREATININE: 1.66 mg/dL — AB (ref 0.61–1.24)
Chloride: 105 mmol/L (ref 101–111)
GFR calc non Af Amer: 35 mL/min — ABNORMAL LOW (ref >60)
GFR, EST AFRICAN AMERICAN: 41 mL/min — AB (ref >60)
Glucose, Bld: 164 mg/dL — ABNORMAL HIGH (ref 65–99)
Potassium: 4 mmol/L (ref 3.5–5.1)
SODIUM: 140 mmol/L (ref 135–145)

## 2015-02-12 LAB — ETHANOL

## 2015-02-12 LAB — URINALYSIS, ROUTINE W REFLEX MICROSCOPIC
BILIRUBIN URINE: NEGATIVE
Glucose, UA: NEGATIVE mg/dL
Hgb urine dipstick: NEGATIVE
KETONES UR: NEGATIVE mg/dL
Leukocytes, UA: NEGATIVE
NITRITE: NEGATIVE
PH: 5.5 (ref 5.0–8.0)
Protein, ur: NEGATIVE mg/dL
Specific Gravity, Urine: 1.019 (ref 1.005–1.030)

## 2015-02-12 LAB — RAPID URINE DRUG SCREEN, HOSP PERFORMED
Amphetamines: NOT DETECTED
BARBITURATES: NOT DETECTED
Benzodiazepines: NOT DETECTED
COCAINE: NOT DETECTED
Opiates: NOT DETECTED
Tetrahydrocannabinol: NOT DETECTED

## 2015-02-12 LAB — LACTIC ACID, PLASMA: Lactic Acid, Venous: 2.3 mmol/L (ref 0.5–2.0)

## 2015-02-12 LAB — MRSA PCR SCREENING: MRSA by PCR: NEGATIVE

## 2015-02-12 LAB — TSH: TSH: 0.12 u[IU]/mL — ABNORMAL LOW (ref 0.350–4.500)

## 2015-02-12 LAB — I-STAT TROPONIN, ED: Troponin i, poc: 0.05 ng/mL (ref 0.00–0.08)

## 2015-02-12 LAB — I-STAT CG4 LACTIC ACID, ED: LACTIC ACID, VENOUS: 4.49 mmol/L — AB (ref 0.5–2.0)

## 2015-02-12 MED ORDER — DEXAMETHASONE 4 MG PO TABS
4.0000 mg | ORAL_TABLET | Freq: Every day | ORAL | Status: DC
  Administered 2015-02-13 – 2015-02-16 (×4): 4 mg via ORAL
  Filled 2015-02-12 (×3): qty 2
  Filled 2015-02-12: qty 1
  Filled 2015-02-12: qty 2

## 2015-02-12 MED ORDER — MEMANTINE HCL 10 MG PO TABS
10.0000 mg | ORAL_TABLET | Freq: Two times a day (BID) | ORAL | Status: DC
  Administered 2015-02-12 – 2015-02-16 (×8): 10 mg via ORAL
  Filled 2015-02-12 (×3): qty 2
  Filled 2015-02-12 (×2): qty 1
  Filled 2015-02-12 (×3): qty 2

## 2015-02-12 MED ORDER — SODIUM CHLORIDE 0.9 % IV BOLUS (SEPSIS)
500.0000 mL | Freq: Once | INTRAVENOUS | Status: AC
  Administered 2015-02-12: 500 mL via INTRAVENOUS

## 2015-02-12 MED ORDER — DONEPEZIL HCL 10 MG PO TABS
10.0000 mg | ORAL_TABLET | Freq: Every day | ORAL | Status: DC
  Administered 2015-02-12 – 2015-02-15 (×4): 10 mg via ORAL
  Filled 2015-02-12 (×4): qty 1

## 2015-02-12 MED ORDER — AMIODARONE HCL IN DEXTROSE 360-4.14 MG/200ML-% IV SOLN
30.0000 mg/h | INTRAVENOUS | Status: DC
  Administered 2015-02-12 – 2015-02-14 (×4): 30 mg/h via INTRAVENOUS
  Filled 2015-02-12 (×3): qty 200

## 2015-02-12 MED ORDER — DEXTROSE 5 % IV SOLN
5.0000 mg/h | INTRAVENOUS | Status: DC
  Administered 2015-02-12: 5 mg/h via INTRAVENOUS

## 2015-02-12 MED ORDER — ACYCLOVIR 400 MG PO TABS
400.0000 mg | ORAL_TABLET | Freq: Every day | ORAL | Status: DC
  Administered 2015-02-13 – 2015-02-16 (×4): 400 mg via ORAL
  Filled 2015-02-12 (×5): qty 1

## 2015-02-12 MED ORDER — SODIUM CHLORIDE 0.9 % IV SOLN
INTRAVENOUS | Status: DC
  Administered 2015-02-12: 20:00:00 via INTRAVENOUS
  Administered 2015-02-13: 1000 mL via INTRAVENOUS
  Administered 2015-02-15: 04:00:00 via INTRAVENOUS

## 2015-02-12 MED ORDER — DILTIAZEM LOAD VIA INFUSION
5.0000 mg | Freq: Once | INTRAVENOUS | Status: AC
  Administered 2015-02-12: 5 mg via INTRAVENOUS
  Filled 2015-02-12: qty 5

## 2015-02-12 MED ORDER — AMIODARONE HCL IN DEXTROSE 360-4.14 MG/200ML-% IV SOLN
INTRAVENOUS | Status: AC
  Filled 2015-02-12: qty 200

## 2015-02-12 MED ORDER — TAMSULOSIN HCL 0.4 MG PO CAPS
0.4000 mg | ORAL_CAPSULE | Freq: Every day | ORAL | Status: DC
  Administered 2015-02-13 – 2015-02-16 (×4): 0.4 mg via ORAL
  Filled 2015-02-12 (×4): qty 1

## 2015-02-12 MED ORDER — ADULT MULTIVITAMIN W/MINERALS CH
1.0000 | ORAL_TABLET | Freq: Every day | ORAL | Status: DC
  Administered 2015-02-13 – 2015-02-16 (×4): 1 via ORAL
  Filled 2015-02-12 (×4): qty 1

## 2015-02-12 MED ORDER — SODIUM CHLORIDE 0.9 % IV SOLN
INTRAVENOUS | Status: DC
  Administered 2015-02-12: 500 mL via INTRAVENOUS

## 2015-02-12 MED ORDER — BOOST HIGH PROTEIN PO LIQD
1.0000 | Freq: Every morning | ORAL | Status: DC
  Filled 2015-02-12: qty 237

## 2015-02-12 MED ORDER — AMIODARONE HCL IN DEXTROSE 360-4.14 MG/200ML-% IV SOLN
60.0000 mg/h | INTRAVENOUS | Status: AC
  Administered 2015-02-12: 60 mg/h via INTRAVENOUS
  Filled 2015-02-12: qty 200

## 2015-02-12 MED ORDER — SODIUM CHLORIDE 0.9 % IV BOLUS (SEPSIS)
1000.0000 mL | Freq: Once | INTRAVENOUS | Status: AC
  Administered 2015-02-12: 1000 mL via INTRAVENOUS

## 2015-02-12 MED ORDER — CETYLPYRIDINIUM CHLORIDE 0.05 % MT LIQD
7.0000 mL | Freq: Two times a day (BID) | OROMUCOSAL | Status: DC
  Administered 2015-02-12 – 2015-02-16 (×8): 7 mL via OROMUCOSAL

## 2015-02-12 MED ORDER — SODIUM CHLORIDE 0.9 % IJ SOLN
3.0000 mL | Freq: Two times a day (BID) | INTRAMUSCULAR | Status: DC
  Administered 2015-02-12 – 2015-02-16 (×6): 3 mL via INTRAVENOUS

## 2015-02-12 NOTE — ED Notes (Signed)
Bed: WA19 Expected date:  Expected time:  Means of arrival:  Comments: EMS 

## 2015-02-12 NOTE — ED Provider Notes (Signed)
CSN: [id number]     Arrival date & time 02/12/15  1126 History   First MD Initiated Contact with Patient 02/12/15 1145     Chief Complaint  Patient presents with  . Loss of Consciousness     (Consider location/radiation/quality/duration/timing/severity/associated sxs/prior Treatment) Patient is a 81 y.o. male presenting with syncope.  Loss of Consciousness Episode history:  Multiple Most recent episode:  Today Duration: brief. Timing:  Intermittent Progression:  Unchanged Chronicity:  New Context comment:  While using bathroom Witnessed: yes   Relieved by:  Nothing Worsened by:  Nothing tried Associated symptoms: confusion (baseline), nausea and vomiting   Associated symptoms: no anxiety, no chest pain and no fever     Past Medical History  Diagnosis Date  . Hypertension   . Dementia   . Hepatitis B   . Dysphasia   . Chronic kidney disease   . Cancer ([place])   . Seizure ([place]) 02/11/2015   History reviewed. No pertinent past surgical history. Family History  Problem Relation Age of Onset  . Family history unknown: Yes   Social History  Substance Use Topics  . Smoking status: Never Smoker   . Smokeless tobacco: Never Used  . Alcohol Use: No    Review of Systems  Constitutional: Negative for fever.  Cardiovascular: Positive for syncope. Negative for chest pain.  Gastrointestinal: Positive for nausea and vomiting.  Psychiatric/Behavioral: Positive for confusion (baseline).  All other systems reviewed and are negative.     Allergies  Bee venom; Oxycodone; and Tramadol  Home Medications   Prior to Admission medications   Medication Sig Start Date End Date Taking? Authorizing Provider  acetaminophen (TYLENOL) 500 MG tablet Take 1,000 mg by mouth every 6 (six) hours as needed for moderate pain.   Yes Historical Provider, MD  acyclovir (ZOVIRAX) 400 MG tablet Take 1 tablet (400 mg total) by mouth daily. 12/16/14  Yes Heath Lark, MD  aspirin 81 MG tablet Take 81  mg by mouth daily.   Yes Historical Provider, MD  dexamethasone (DECADRON) 4 MG tablet TAKE 1 TABLET BY MOUTH EVERY DAY Patient taking differently: Take 1 tablet by mouth every day. 02/11/15  Yes Heath Lark, MD  donepezil (ARICEPT) 10 MG tablet Take 10 mg by mouth at bedtime.   Yes Historical Provider, MD  doxazosin (CARDURA) 8 MG tablet Take 8 mg by mouth daily. 12/21/14  Yes Historical Provider, MD  feeding supplement (BOOST HIGH PROTEIN) LIQD Take 1 Container by mouth every morning.   Yes Historical Provider, MD  memantine (NAMENDA) 10 MG tablet Take 10 mg by mouth 2 (two) times daily.   Yes Historical Provider, MD  metoprolol tartrate (LOPRESSOR) 25 MG tablet Take 25 mg by mouth 2 (two) times daily.   Yes Historical Provider, MD  Multiple Vitamin (MULTIVITAMIN WITH MINERALS) TABS tablet Take 1 tablet by mouth daily.   Yes Historical Provider, MD  polyethylene glycol (MIRALAX / GLYCOLAX) packet Take 17 g by mouth daily as needed.   Yes Historical Provider, MD  hydrALAZINE (APRESOLINE) 10 MG tablet Take 1 tablet (10 mg total) by mouth every 8 (eight) hours. Patient not taking: Reported on 02/12/2015 12/14/14   Theodis Blaze, MD  lenalidomide (REVLIMID) 2.5 MG capsule 1 capsule daily for 14 days then rest 7 days Patient not taking: Reported on 02/01/2015 01/11/15   Heath Lark, MD  ondansetron (ZOFRAN) 8 MG tablet Take 1 tablet (8 mg total) by mouth every 8 (eight) hours as needed for nausea. Patient not  taking: Reported on 01/11/2015 12/16/14   Heath Lark, MD  oxyCODONE (OXY IR/ROXICODONE) 5 MG immediate release tablet Take 1 tablet (5 mg total) by mouth every 4 (four) hours as needed for moderate pain. Patient not taking: Reported on 01/05/2015 12/14/14   Theodis Blaze, MD  prochlorperazine (COMPAZINE) 10 MG tablet Take 1 tablet (10 mg total) by mouth every 6 (six) hours as needed (Nausea or vomiting). Patient not taking: Reported on 01/11/2015 12/16/14   Heath Lark, MD   BP 82/56 mmHg  Pulse 135   Temp(Src) 97.7 F (36.5 C) (Rectal)  Resp 16  SpO2 98% Physical Exam  Constitutional: He is oriented to person, place, and time. He appears well-developed and well-nourished.  HENT:  Head: Normocephalic and atraumatic.  Eyes: Conjunctivae and EOM are normal.  Neck: Normal range of motion. Neck supple.  Cardiovascular: Normal heart sounds.  An irregularly irregular rhythm present. Tachycardia present.   Pulmonary/Chest: Effort normal and breath sounds normal. No respiratory distress.  Abdominal: He exhibits no distension. There is no tenderness. There is no rebound and no guarding.  Musculoskeletal: Normal range of motion.  Neurological: He is alert and oriented to person, place, and time. He has normal strength. No cranial nerve deficit or sensory deficit. GCS eye subscore is 4. GCS verbal subscore is 4. GCS motor subscore is 6.  MAE, no focal neuro deficits  Skin: Skin is warm and dry.  Vitals reviewed.   ED Course  Procedures (including critical care time) Labs Review Labs Reviewed  CBC WITH DIFFERENTIAL/PLATELET - Abnormal; Notable for the following:    RBC 3.10 (*)    Hemoglobin 10.7 (*)    HCT 31.9 (*)    MCV 102.9 (*)    MCH 34.5 (*)    Platelets 37 (*)    Lymphs Abs 0.5 (*)    All other components within normal limits  BASIC METABOLIC PANEL - Abnormal; Notable for the following:    Glucose, Bld 164 (*)    BUN 40 (*)    Creatinine, Ser 1.66 (*)    Calcium 8.8 (*)    GFR calc non Af Amer 35 (*)    GFR calc Af Amer 41 (*)    All other components within normal limits  I-STAT CG4 LACTIC ACID, ED - Abnormal; Notable for the following:    Lactic Acid, Venous 4.49 (*)    All other components within normal limits  CULTURE, BLOOD (ROUTINE X 2)  CULTURE, BLOOD (ROUTINE X 2)  ETHANOL  URINALYSIS, ROUTINE W REFLEX MICROSCOPIC (NOT AT [facility])  URINE RAPID DRUG SCREEN, HOSP PERFORMED  I-STAT TROPOININ, ED  I-STAT CG4 LACTIC ACID, ED    Imaging Review Ct Head Wo  Contrast  02/12/2015  CLINICAL DATA:  Patient with multiple syncopal episodes and confusion. Dizziness and lightheadedness. EXAM: CT HEAD WITHOUT CONTRAST TECHNIQUE: Contiguous axial images were obtained from the base of the skull through the vertex without intravenous contrast. COMPARISON:  CT brain 01/05/2015 FINDINGS: Ventricles and sulci are prominent. Periventricular and subcortical white matter hypodensity compatible with chronic small vessel ischemic changes. No evidence for acute cortically based infarct, intracranial hemorrhage, mass lesion or mass-effect. Orbits are unremarkable. Mastoid air cells unremarkable. Mucosal thickening within the bilateral maxillary sinuses. IMPRESSION: No acute intracranial process. Chronic small vessel ischemic changes. Electronically Signed   By: Lovey Newcomer M.D.   On: 02/12/2015 13:21   Dg Chest Portable 1 View  02/12/2015  CLINICAL DATA:  Syncope and confusion. Dizziness and lightheadedness. EXAM:  PORTABLE CHEST 1 VIEW COMPARISON:  01/05/2015 FINDINGS: The heart size and mediastinal contours are within normal limits. Chronic interstitial coarsening noted. No airspace consolidation. The visualized skeletal structures are unremarkable. IMPRESSION: 1. No acute findings. 2. Chronic interstitial coarsening. Electronically Signed   By: Kerby Moors M.D.   On: 02/12/2015 13:03   I have personally reviewed and evaluated these images and lab results as part of my medical decision-making.   EKG Interpretation None     CRITICAL CARE Performed by: Debby Freiberg   Total critical care time: 35 minutes  Critical care time was exclusive of separately billable procedures and treating other patients.  Critical care was necessary to treat or prevent imminent or life-threatening deterioration.  Critical care was time spent personally by me on the following activities: development of treatment plan with patient and/or surrogate as well as nursing, discussions with  consultants, evaluation of patient's response to treatment, examination of patient, obtaining history from patient or surrogate, ordering and performing treatments and interventions, ordering and review of laboratory studies, ordering and review of radiographic studies, pulse oximetry and re-evaluation of patient's condition.  MDM   Final diagnoses:  None    81 y.o. male with pertinent PMH of HTN, prior seizures, ckd, dementia presents with syncope x 3.  Pt has been progressively more weak over the last week, however was otherwise well until this am when he had syncope x 3 with vomiting.  On arrival pt at his baseline (confirmed with family), vitals with tachycardia, irregular, with afib with rvr on ecg.  BP hypotensive.  Given no ho afib and unclear time of onset with generalized weakness x 1 week, deferred initial cardioversion given possibility of sepsis or other pathology.  Elba Barman as above without clear etiology of symptoms.  Consulted cardiology, but no response.  Dilt 5mg  given with plan for drip.  Consulted hospitalist for admission.  I have reviewed all laboratory and imaging studies if ordered as above  afib with RVR    Debby Freiberg, MD 02/12/15 1546

## 2015-02-12 NOTE — ED Notes (Addendum)
Attempted to in and out cath pt. Was unsuccessful due to resistance.

## 2015-02-12 NOTE — Consult Note (Signed)
CARDIOLOGY CONSULT NOTE  Patient ID: Darryl Ryan MRN: [id number] DOB/AGE: [birth year] 81 y.o.  Admit date: 02/12/2015 Primary Cardiologist: New  Reason for Consultation: atrial fibrillation with RVR  HPI: 81 yo with history of multiple myeloma, pancytopenia from myeloma treatment, CKD stage IV, and dementia presented with atrial fibrillation with RVR. Patient is awake/alert but unable to give any history, likely due to dementia.  From family, he was lethargic today and syncope x 3.  He was brought to the ER and found to be in atrial fibrillation with RVR with elevated lactate.  Currently, HR in 120s with SBP 90s after getting diltiazem 5 mg IV x 1.  Review of systems complete and found to be negative unless listed above in HPI  Past Medical History: 1. Multiple myeloma: Pancytopenia from Revlimid, now on Velcade. 2. Pancytopenia: Related to Revlimid. 3. CKD stage IV 4. Low back pain. 5. HTN 6. Dementia 7. BPH  Family History  Problem Relation Age of Onset  . Family history unknown: Yes    Social History   Social History  . Marital Status: Married    Spouse Name: N/A  . Number of Children: N/A  . Years of Education: N/A   Occupational History  . Not on file.   Social History Main Topics  . Smoking status: Never Smoker   . Smokeless tobacco: Never Used  . Alcohol Use: No  . Drug Use: No  . Sexual Activity: No   Other Topics Concern  . Not on file   Social History Narrative     Prescriptions prior to admission  Medication Sig Dispense Refill Last Dose  . acetaminophen (TYLENOL) 500 MG tablet Take 1,000 mg by mouth every 6 (six) hours as needed for moderate pain.   Past Month at Unknown time  . acyclovir (ZOVIRAX) 400 MG tablet Take 1 tablet (400 mg total) by mouth daily. 60 tablet 3 02/12/2015 at Unknown time  . aspirin 81 MG tablet Take 81 mg by mouth daily.   02/12/2015 at Unknown time  . dexamethasone (DECADRON) 4 MG tablet TAKE 1 TABLET BY MOUTH EVERY  DAY (Patient taking differently: Take 1 tablet by mouth every day.) 30 tablet 0 02/12/2015 at Unknown time  . donepezil (ARICEPT) 10 MG tablet Take 10 mg by mouth at bedtime.   Past Week at Unknown time  . doxazosin (CARDURA) 8 MG tablet Take 8 mg by mouth daily.  3 02/12/2015 at Unknown time  . feeding supplement (BOOST HIGH PROTEIN) LIQD Take 1 Container by mouth every morning.   02/12/2015 at Unknown time  . memantine (NAMENDA) 10 MG tablet Take 10 mg by mouth 2 (two) times daily.   02/12/2015 at Unknown time  . metoprolol tartrate (LOPRESSOR) 25 MG tablet Take 25 mg by mouth 2 (two) times daily.   02/12/2015 at 0900  . Multiple Vitamin (MULTIVITAMIN WITH MINERALS) TABS tablet Take 1 tablet by mouth daily.   02/12/2015 at Unknown time  . polyethylene glycol (MIRALAX / GLYCOLAX) packet Take 17 g by mouth daily as needed.   02/12/2015 at Unknown time  . hydrALAZINE (APRESOLINE) 10 MG tablet Take 1 tablet (10 mg total) by mouth every 8 (eight) hours. (Patient not taking: Reported on 02/12/2015) 90 tablet 0 Taking  . lenalidomide (REVLIMID) 2.5 MG capsule 1 capsule daily for 14 days then rest 7 days (Patient not taking: Reported on 02/01/2015) 14 capsule 0 Not Taking  . ondansetron (ZOFRAN) 8 MG tablet Take 1 tablet (8 mg total)  by mouth every 8 (eight) hours as needed for nausea. (Patient not taking: Reported on 01/11/2015) 30 tablet 1 Not Taking  . oxyCODONE (OXY IR/ROXICODONE) 5 MG immediate release tablet Take 1 tablet (5 mg total) by mouth every 4 (four) hours as needed for moderate pain. (Patient not taking: Reported on 01/05/2015) 30 tablet 0 Not Taking  . prochlorperazine (COMPAZINE) 10 MG tablet Take 1 tablet (10 mg total) by mouth every 6 (six) hours as needed (Nausea or vomiting). (Patient not taking: Reported on 01/11/2015) 30 tablet 1 Not Taking    Physical exam Blood pressure 109/72, pulse 144, temperature 99.3 F (37.4 C), temperature source Oral, resp. rate 34, height 5' 9"  (1.753 m),  weight 148 lb 5.9 oz (67.3 kg), SpO2 97 %. General: NAD Neck: JVP not elevated, no thyromegaly or thyroid nodule.  Lungs: Slight crackles at bases CV: Nondisplaced PMI.  Heart rachy, irregular S1/S2, no S3/S4.  No peripheral edema.  No carotid bruit.   Abdomen: Soft, nontender, no hepatosplenomegaly, no distention.  Skin: Intact without lesions or rashes.  Neurologic: Alert but does not answer questions, relative gives all answers  Psych: Normal affect. Extremities: No clubbing or cyanosis.  HEENT: Normal.   Labs:   Lab Results  Component Value Date   WBC 6.8 02/12/2015   HGB 10.7* 02/12/2015   HCT 31.9* 02/12/2015   MCV 102.9* 02/12/2015   PLT 37* 02/12/2015    Recent Labs Lab 02/08/15 1235 02/12/15 1216  NA 138 140  K 3.7 4.0  CL  --  105  CO2 24 23  BUN 43.2* 40*  CREATININE 1.8* 1.66*  CALCIUM 8.9 8.8*  PROT 6.2*  --   BILITOT 0.58  --   ALKPHOS 137  --   ALT 80*  --   AST 19  --   GLUCOSE 179* 164*  TnI 0.05 Lactate 4.49  Radiology: - CXR: No acute findings  EKG: atrial fibrillation with RVR, LVH with repolarization abnormality  ASSESSMENT AND PLAN:  81 yo with history of multiple myeloma, pancytopenia from myeloma treatment, CKD stage IV, and dementia presented with atrial fibrillation with RVR. 1. Atrial fibrillation with RVR: SBP in 90s, HR still elevated moderately to 120s.  CHADSVASC = 3.  - Think BP too soft for diltiazem gtt, will rate control with amiodarone gtt.  - He is not going to be a good anticoagulation candidate: fall risk/dementia, thrombocytopenia (plts 37K today).  - Echo 2. Hypotension: Probably not just due to atrial fibrillation/RVR.  Suspect component of dehydration (poor po intake per family) and cannot rule out sepsis.   - Agree with IV fluid. - Would consider coverage with antibiotics, would get blood cultures.  3. Dementia: Patient is poorly communicative.   Signed: Loralie Champagne 02/12/2015

## 2015-02-12 NOTE — ED Notes (Signed)
Awake. Verbally responsive. A/O x3 with intermittent confusion. Resp even and unlabored. No audible adventitious breath sounds noted. ABC's intact. A. Fib on monitor. Dr. Colin Rhein aware. IV saline lock patent and intact.

## 2015-02-12 NOTE — ED Notes (Addendum)
Pt arrived via EMS with report of having syncope episodes x3 and confusion. Pt reported dizziness/lightheadedness, nausea and visual disturbances but denies headache or hitting head. Pt taken last dose of Chemo yesterday.

## 2015-02-12 NOTE — ED Notes (Signed)
Notified edp and nurse results from Istat lactic acid

## 2015-02-12 NOTE — H&P (Signed)
History and Physical  NOHLAN Ryan LDJ:[id number] DOB: Aug 03, 1923 DOA: 02/12/2015  Referring physician: EDP PCP: Mathews Argyle, MD   Chief Complaint: syncope/seizure activity  HPI: Darryl Ryan is a 81 y.o. male   H/o mild dementia, htn, Was recently diagnosed with multiple myeloma under active treatment, per wife patient has showed progressive decline, now has been wheelchair bound in the last two weeks ( he was able to drive in 0/3009). Patient recently started to have "seizure" like activity per wife, he was observed to have whole boyd shaking last for about 20-45mns, no tongue biting or incontinence. Today, he has another episode and also vomited, wife called EMS, [facility] found to be in afib/rvr with low blood pressure, mental status at baseline per wife, no seizure activity observed while in the ED, CT head unremarkable, labs with lactic acidosis, no fever, no leukocytosis, he was given ivf/ iv cardizem, EDP called cardiology and hospitalist called to admit the patient.  Review of Systems:  Detail per HPI, Review of systems are otherwise negative  Past Medical History  Diagnosis Date  . Hypertension   . Dementia   . Hepatitis B   . Dysphasia   . Chronic kidney disease   . Cancer ([place]   . [place] ([place] 02/11/2015   History reviewed. No pertinent past surgical history. Social History:  reports that he has never smoked. He has never used smokeless tobacco. He reports that he does not drink alcohol or use illicit drugs. Patient lives at home & has been showing progressive decline, now wheelchair bound for the last two weeks.  Allergies  Allergen Reactions  . Bee Venom Anaphylaxis  . Oxycodone     Makes patient crazy   . Tramadol Hives    Family History  Problem Relation Age of Onset  . Family history unknown: Yes      Prior to Admission medications   Medication Sig Start Date End Date Taking? Authorizing Provider  acetaminophen  (TYLENOL) 500 MG tablet Take 1,000 mg by mouth every 6 (six) hours as needed for moderate pain.   Yes Historical Provider, MD  acyclovir (ZOVIRAX) 400 MG tablet Take 1 tablet (400 mg total) by mouth daily. 12/16/14  Yes NHeath Lark MD  aspirin 81 MG tablet Take 81 mg by mouth daily.   Yes Historical Provider, MD  dexamethasone (DECADRON) 4 MG tablet TAKE 1 TABLET BY MOUTH EVERY DAY Patient taking differently: Take 1 tablet by mouth every day. 02/11/15  Yes NHeath Lark MD  donepezil (ARICEPT) 10 MG tablet Take 10 mg by mouth at bedtime.   Yes Historical Provider, MD  doxazosin (CARDURA) 8 MG tablet Take 8 mg by mouth daily. 12/21/14  Yes Historical Provider, MD  feeding supplement (BOOST HIGH PROTEIN) LIQD Take 1 Container by mouth every morning.   Yes Historical Provider, MD  memantine (NAMENDA) 10 MG tablet Take 10 mg by mouth 2 (two) times daily.   Yes Historical Provider, MD  metoprolol tartrate (LOPRESSOR) 25 MG tablet Take 25 mg by mouth 2 (two) times daily.   Yes Historical Provider, MD  Multiple Vitamin (MULTIVITAMIN WITH MINERALS) TABS tablet Take 1 tablet by mouth daily.   Yes Historical Provider, MD  polyethylene glycol (MIRALAX / GLYCOLAX) packet Take 17 g by mouth daily as needed.   Yes Historical Provider, MD  hydrALAZINE (APRESOLINE) 10 MG tablet Take 1 tablet (10 mg total) by mouth every 8 (eight) hours. Patient not taking: Reported on 02/12/2015 12/14/14  Theodis Blaze, MD  lenalidomide (REVLIMID) 2.5 MG capsule 1 capsule daily for 14 days then rest 7 days Patient not taking: Reported on 02/01/2015 01/11/15   Heath Lark, MD  ondansetron (ZOFRAN) 8 MG tablet Take 1 tablet (8 mg total) by mouth every 8 (eight) hours as needed for nausea. Patient not taking: Reported on 01/11/2015 12/16/14   Heath Lark, MD  oxyCODONE (OXY IR/ROXICODONE) 5 MG immediate release tablet Take 1 tablet (5 mg total) by mouth every 4 (four) hours as needed for moderate pain. Patient not taking: Reported on  01/05/2015 12/14/14   Theodis Blaze, MD  prochlorperazine (COMPAZINE) 10 MG tablet Take 1 tablet (10 mg total) by mouth every 6 (six) hours as needed (Nausea or vomiting). Patient not taking: Reported on 01/11/2015 12/16/14   Heath Lark, MD    Physical Exam: BP 109/72 mmHg  Pulse 144  Temp(Src) 99.3 F (37.4 C) (Oral)  Resp 34  Ht 5' 9"  (1.753 m)  Wt 148 lb 5.9 oz (67.3 kg)  BMI 21.90 kg/m2  SpO2 97%  General:  Frail, NAD Eyes: PERRL ENT: unremarkable Neck: supple, no JVD Cardiovascular: IRRR Respiratory: CTABL Abdomen: soft/ND/ND, positive bowel sounds Skin: no rash Musculoskeletal:  No edema Psychiatric: calm/cooperative Neurologic: mild dementia, not oriented to time, but to place and person, no focal deficit          Labs on Admission:  Basic Metabolic Panel:  Recent Labs Lab 02/08/15 1235 02/12/15 1216  NA 138 140  K 3.7 4.0  CL  --  105  CO2 24 23  GLUCOSE 179* 164*  BUN 43.2* 40*  CREATININE 1.8* 1.66*  CALCIUM 8.9 8.8*   Liver Function Tests:  Recent Labs Lab 02/08/15 1235  AST 19  ALT 80*  ALKPHOS 137  BILITOT 0.58  PROT 6.2*  ALBUMIN 3.1*   No results for input(s): LIPASE, AMYLASE in the last 168 hours. No results for input(s): AMMONIA in the last 168 hours. CBC:  Recent Labs Lab 02/08/15 1235 02/12/15 1216  WBC 9.2 6.8  NEUTROABS 8.1* 5.9  HGB 11.7* 10.7*  HCT 34.3* 31.9*  MCV 101.8* 102.9*  PLT 70* 37*   Cardiac Enzymes: No results for input(s): CKTOTAL, CKMB, CKMBINDEX, TROPONINI in the last 168 hours.  BNP (last 3 results) No results for input(s): BNP in the last 8760 hours.  ProBNP (last 3 results) No results for input(s): PROBNP in the last 8760 hours.  CBG: No results for input(s): GLUCAP in the last 168 hours.  Radiological Exams on Admission: Ct Head Wo Contrast  02/12/2015  CLINICAL DATA:  Patient with multiple syncopal episodes and confusion. Dizziness and lightheadedness. EXAM: CT HEAD WITHOUT CONTRAST  TECHNIQUE: Contiguous axial images were obtained from the base of the skull through the vertex without intravenous contrast. COMPARISON:  CT brain 01/05/2015 FINDINGS: Ventricles and sulci are prominent. Periventricular and subcortical white matter hypodensity compatible with chronic small vessel ischemic changes. No evidence for acute cortically based infarct, intracranial hemorrhage, mass lesion or mass-effect. Orbits are unremarkable. Mastoid air cells unremarkable. Mucosal thickening within the bilateral maxillary sinuses. IMPRESSION: No acute intracranial process. Chronic small vessel ischemic changes. Electronically Signed   By: Lovey Newcomer M.D.   On: 02/12/2015 13:21   Dg Chest Portable 1 View  02/12/2015  CLINICAL DATA:  Syncope and confusion. Dizziness and lightheadedness. EXAM: PORTABLE CHEST 1 VIEW COMPARISON:  01/05/2015 FINDINGS: The heart size and mediastinal contours are within normal limits. Chronic interstitial coarsening noted. No airspace consolidation.  The visualized skeletal structures are unremarkable. IMPRESSION: 1. No acute findings. 2. Chronic interstitial coarsening. Electronically Signed   By: Kerby Moors M.D.   On: 02/12/2015 13:03    EKG: Independently reviewed. afib/RVR  Assessment/Plan Present on Admission:  . Atrial fibrillation with RVR (HCC)   Afib/RVR: newonset, with low blood pressure, cardiology recommended amiodarone drip, due to thrombocytopenia, not a candidate for anticoagulation.echo pending, patient denies chest pain, no sob, on room air. Check tsh.  Syncope with "seizure" like activity: start seizure and aspiration precaution, ordered EEG, ct head no acute  Findings.  Lactic acidosis: no overt sign of infection, from poor perfusion due to afib/rvr/hypotension? Repeat labs, ua pending, blood culture drawn in the ED.   CKD III: stable at baseline, ua pending, renal dosing meds.  Thrombocytopenia: from multiple myeloma vs chemo. No active bleed, close  monitor, transfuse platelet if plt less than 10k.  Multiple myeloma: off revlimid due to pancytopenia, on bortezomib, last does yestderday on 11/8. Reported diffuse bone pain, prn pain meds. Continue daily decadron, and acyclovir prophylaxis.  FTT/generalized weakness/malnutrition: nutrition consult, will need PT once stabilized.   DVT prophylaxis: scd's  Consultants: cardiology  Code Status: DNR  Family Communication:  Patient and wife  Disposition Plan: admit to stepdown  Time spent: 39mns  Javonne Louissaint MD, PhD [facility] Pager [phone] 7PM-7AM, please contact night-coverage at [email or web address], password [email or web address]

## 2015-02-13 ENCOUNTER — Inpatient Hospital Stay (HOSPITAL_COMMUNITY): Payer: Medicare Other

## 2015-02-13 DIAGNOSIS — I4891 Unspecified atrial fibrillation: Secondary | ICD-10-CM

## 2015-02-13 DIAGNOSIS — R55 Syncope and collapse: Secondary | ICD-10-CM

## 2015-02-13 DIAGNOSIS — C9000 Multiple myeloma not having achieved remission: Secondary | ICD-10-CM | POA: Insufficient documentation

## 2015-02-13 DIAGNOSIS — E872 Acidosis, unspecified: Secondary | ICD-10-CM | POA: Insufficient documentation

## 2015-02-13 DIAGNOSIS — E876 Hypokalemia: Secondary | ICD-10-CM | POA: Insufficient documentation

## 2015-02-13 DIAGNOSIS — D696 Thrombocytopenia, unspecified: Secondary | ICD-10-CM | POA: Insufficient documentation

## 2015-02-13 LAB — COMPREHENSIVE METABOLIC PANEL
ALBUMIN: 2.5 g/dL — AB (ref 3.5–5.0)
ALK PHOS: 82 U/L (ref 38–126)
ALT: 33 U/L (ref 17–63)
ANION GAP: 9 (ref 5–15)
AST: 28 U/L (ref 15–41)
BILIRUBIN TOTAL: 0.5 mg/dL (ref 0.3–1.2)
BUN: 40 mg/dL — AB (ref 6–20)
CALCIUM: 8.1 mg/dL — AB (ref 8.9–10.3)
CO2: 24 mmol/L (ref 22–32)
CREATININE: 1.45 mg/dL — AB (ref 0.61–1.24)
Chloride: 107 mmol/L (ref 101–111)
GFR calc Af Amer: 48 mL/min — ABNORMAL LOW (ref >60)
GFR calc non Af Amer: 41 mL/min — ABNORMAL LOW (ref >60)
GLUCOSE: 218 mg/dL — AB (ref 65–99)
Potassium: 3.3 mmol/L — ABNORMAL LOW (ref 3.5–5.1)
SODIUM: 140 mmol/L (ref 135–145)
TOTAL PROTEIN: 4.8 g/dL — AB (ref 6.5–8.1)

## 2015-02-13 LAB — CBC
HCT: 29.4 % — ABNORMAL LOW (ref 39.0–52.0)
Hemoglobin: 10 g/dL — ABNORMAL LOW (ref 13.0–17.0)
MCH: 35 pg — AB (ref 26.0–34.0)
MCHC: 34 g/dL (ref 30.0–36.0)
MCV: 102.8 fL — ABNORMAL HIGH (ref 78.0–100.0)
PLATELETS: 28 10*3/uL — AB (ref 150–400)
RBC: 2.86 MIL/uL — ABNORMAL LOW (ref 4.22–5.81)
RDW: 15.6 % — AB (ref 11.5–15.5)
WBC: 10.8 10*3/uL — ABNORMAL HIGH (ref 4.0–10.5)

## 2015-02-13 LAB — TYPE AND SCREEN
ABO/RH(D): O POS
Antibody Screen: NEGATIVE

## 2015-02-13 LAB — PROTIME-INR
INR: 1.15 (ref 0.00–1.49)
PROTHROMBIN TIME: 14.9 s (ref 11.6–15.2)

## 2015-02-13 LAB — PROCALCITONIN: Procalcitonin: 0.63 ng/mL

## 2015-02-13 LAB — LACTIC ACID, PLASMA
LACTIC ACID, VENOUS: 3.1 mmol/L — AB (ref 0.5–2.0)
LACTIC ACID, VENOUS: 1.4 mmol/L (ref 0.5–2.0)
Lactic Acid, Venous: 1.1 mmol/L (ref 0.5–2.0)

## 2015-02-13 LAB — T4, FREE: FREE T4: 0.75 ng/dL (ref 0.61–1.12)

## 2015-02-13 LAB — MAGNESIUM
Magnesium: 2 mg/dL (ref 1.7–2.4)
Magnesium: 1.9 mg/dL (ref 1.7–2.4)

## 2015-02-13 LAB — VITAMIN B12: Vitamin B-12: 615 pg/mL (ref 180–914)

## 2015-02-13 MED ORDER — LIP MEDEX EX OINT
TOPICAL_OINTMENT | CUTANEOUS | Status: AC
  Administered 2015-02-13: 1
  Filled 2015-02-13: qty 7

## 2015-02-13 MED ORDER — DIPHENHYDRAMINE HCL 50 MG/ML IJ SOLN
12.5000 mg | Freq: Three times a day (TID) | INTRAMUSCULAR | Status: DC | PRN
  Administered 2015-02-13 – 2015-02-15 (×4): 12.5 mg via INTRAVENOUS
  Filled 2015-02-13 (×4): qty 1

## 2015-02-13 MED ORDER — POTASSIUM CHLORIDE 10 MEQ/100ML IV SOLN
10.0000 meq | INTRAVENOUS | Status: AC
  Administered 2015-02-13 (×4): 10 meq via INTRAVENOUS
  Filled 2015-02-13 (×4): qty 100

## 2015-02-13 MED ORDER — ACETAMINOPHEN 325 MG PO TABS
650.0000 mg | ORAL_TABLET | Freq: Four times a day (QID) | ORAL | Status: DC | PRN
  Administered 2015-02-13: 650 mg via ORAL
  Filled 2015-02-13: qty 2

## 2015-02-13 MED ORDER — HYDRALAZINE HCL 20 MG/ML IJ SOLN
10.0000 mg | Freq: Four times a day (QID) | INTRAMUSCULAR | Status: DC | PRN
  Administered 2015-02-13 (×2): 10 mg via INTRAVENOUS
  Filled 2015-02-13 (×3): qty 1

## 2015-02-13 MED ORDER — LORAZEPAM 2 MG/ML IJ SOLN
0.5000 mg | Freq: Three times a day (TID) | INTRAMUSCULAR | Status: DC | PRN
  Administered 2015-02-14: 0.5 mg via INTRAVENOUS
  Filled 2015-02-13: qty 1

## 2015-02-13 MED ORDER — SODIUM CHLORIDE 0.9 % IV BOLUS (SEPSIS)
500.0000 mL | Freq: Once | INTRAVENOUS | Status: AC
  Administered 2015-02-13: 500 mL via INTRAVENOUS

## 2015-02-13 MED ORDER — ENSURE ENLIVE PO LIQD
237.0000 mL | Freq: Every day | ORAL | Status: DC
  Administered 2015-02-13 – 2015-02-14 (×2): 237 mL via ORAL

## 2015-02-13 NOTE — Evaluation (Signed)
Clinical/Bedside Swallow Evaluation Patient Details  Name: Darryl Ryan MRN: [id number] Date of Birth: [birth year]  Today's Date: 02/13/2015 Time: SLP Start Time (ACUTE ONLY): 58 SLP Stop Time (ACUTE ONLY): 1634 SLP Time Calculation (min) (ACUTE ONLY): 19 min  Past Medical History:  Past Medical History  Diagnosis Date  . Hypertension   . Dementia   . Hepatitis B   . Dysphasia   . Chronic kidney disease   . Cancer ([place])   . Seizure ([place]) 02/11/2015   Past Surgical History: History reviewed. No pertinent past surgical history. HPI:  81 yr old PMH: mild dementia, HTN, dysphagia, cancer, seizure, recent diagnosis of multiple myeloma under active treatment admitted with seizure like activity. Per chart pt  found to be in afib/rvr with low blood pressure. CT head unremarkable. CXR no acute findings. Barium esophagram 11/2014 revealed only mild tertiary contractions in the mid and distal esophagus, probable small polyp in the distal esophagus just above the gastroesophageal junction. No definite hernia or reflux is seen. Bedside ordered due to pocketing pills with liquid.   Assessment / Plan / Recommendation Clinical Impression  Pt exhibited a mild cognitive based oral dysphagia initially requiring cues for labial seal around cup and straw. No indications of laryngeal compromise. Frequent eructation, has diagnosed esophageal motility disorder. Family present and agree that his confusion likely contributed to pocketing pills. Educated pt/family re: esophageal precautions (stay sitting upright, alternate liquids/solids). Recommend continue regular texture, thin liquids, pills whole in applesauce (if difficulty, crush pills).     Aspiration Risk   (mild-mod)    Diet Recommendation   Regular, thin, pills whole in applesauce  Medication Administration: Whole meds with puree    Other  Recommendations Oral Care Recommendations: Oral care BID   Follow up Recommendations  None     Frequency and Duration            Swallow Study   General HPI: 81 yr old PMH: mild dementia, HTN, dysphagia, cancer, seizure, recent diagnosis of multiple myeloma under active treatment admitted with seizure like activity. Per chart pt  found to be in afib/rvr with low blood pressure. CT head unremarkable. CXR no acute findings. Barium esophagram 11/2014 revealed only mild tertiary contractions in the mid and distal esophagus, probable small polyp in the distal esophagus just above the gastroesophageal junction. No definite hernia or reflux is seen. Bedside ordered due to pocketing pills with liquid. Type of Study: Bedside Swallow Evaluation Previous Swallow Assessment:  (none) Diet Prior to this Study: Regular;Thin liquids Temperature Spikes Noted: No Respiratory Status: Room air History of Recent Intubation: No Behavior/Cognition: Alert;Cooperative;Pleasant mood;Confused;Requires cueing Oral Cavity Assessment: Within Functional Limits Oral Care Completed by SLP: No Oral Cavity - Dentition: Adequate natural dentition Vision:  (keeps eyes closed ) Self-Feeding Abilities: Needs set up;Needs assist Patient Positioning: Upright in bed Baseline Vocal Quality: Normal Volitional Cough: Strong Volitional Swallow: Able to elicit    Oral/Motor/Sensory Function Overall Oral Motor/Sensory Function: Generalized oral weakness   Ice Chips Ice chips: Not tested   Thin Liquid Thin Liquid: Impaired Presentation: Cup;Straw Oral Phase Impairments:  (difficulty with straw d/t cognitive deficits) Pharyngeal  Phase Impairments:  (none)    Nectar Thick Nectar Thick Liquid: Not tested   Honey Thick Honey Thick Liquid: Not tested   Puree Puree: Not tested   Solid Solid: Within functional limits       Houston Siren 02/13/2015,4:52 PM   Orbie Pyo [place].[employer] [phone]

## 2015-02-13 NOTE — Care Management Note (Addendum)
Case Management Note  Patient Details  Name: Darryl Ryan MRN: [id number] Date of Birth: 02-01-1924  Subjective/Objective:                  syncope/seizure activity  Action/Plan: CM spoke with patient's nurse at the bedside. Patient's wife is not there currently. Per his RN, he has some dementia. She states he is active with [facility]. CM called [facility] and left a message notifying them of his admission. CM will continue to follow for discharge needs.   11:01am - Received a call from the [place], on-call nurse at [facility]. She states the patient will need a new order/new referral for home health services since he is scheduled to be discharged from [facility] today.   Expected Discharge Date:   (UNKNOWN)               Expected Discharge Plan:  [place]  In-House Referral:     Discharge planning Services  CM Consult  Post Acute Care Choice:    Choice offered to:     DME Arranged:    DME Agency:     HH Arranged:    [place] Agency:     Status of Service:  In process, will continue to follow  Medicare Important Message Given:    Date Medicare IM Given:    Medicare IM give by:    Date Additional Medicare IM Given:    Additional Medicare Important Message give by:     If discussed at [place] of Stay Meetings, dates discussed:    Additional Comments:  Apolonio Schneiders, RN 02/13/2015, 10:29 AM

## 2015-02-13 NOTE — Progress Notes (Addendum)
PROGRESS NOTE  Darryl Ryan OMA:[id number] DOB: [birth year] DOA: 02/12/2015 PCP: Mathews Argyle, MD  HPI/Recap of past 24 hours:  Converted to sinus rhythm on amiodarone drip, Had an likely vasovagal episode last night while on toilet Confused  Assessment/Plan: Active Problems:   Atrial fibrillation with RVR ([place])   A-fib ([place])  Afib/RVR: newonset, with low blood pressure initially, cardiology recommended amiodarone drip, due to thrombocytopenia, not a candidate for anticoagulation.echo pending, patient denies chest pain, no sob, on room air.   Suppressed tsh, free t4 pending.  Syncope with "seizure" like activity: start seizure and aspiration precaution, ordered EEG, ct head no acute findings.  Lactic acidosis: no overt sign of infection, from poor perfusion due to afib/rvr/hypotension? Repeat labs, ua unremarkable, blood culture no growth, cxr no infiltrate, no fever, no leukocytosis.   CKD III: stable at baseline, ua unremarkable, renal dosing meds.  Thrombocytopenia: from multiple myeloma vs chemo. No active bleed, close monitor, transfuse platelet if plt less than 10k.  Multiple myeloma: off revlimid due to pancytopenia, on bortezomib, last does yestderday on 11/8. Reported diffuse bone pain, prn pain meds. Continue daily decadron, and acyclovir prophylaxis.  FTT/generalized weakness/malnutrition: nutrition consult, will need PT once stabilized.   Loose stool: wife reported given him miralax prior to admission, monitor.  Hypokalemia: replace k, check mag  Elevated am blood glucose, likely steroid induced, will  Check a1c, carb modified diet for now.  DVT prophylaxis: scd's  Consultants: cardiology  Code Status: DNR  Family Communication: Patient and wife  Procedures:  none  Antibiotics:  none   Objective: BP 151/85 mmHg  Pulse 73  Temp(Src) 98 F (36.7 C) (Oral)  Resp 14  Ht 5' 9"  (1.753 m)  Wt 148 lb 5.9 oz (67.3 kg)  BMI 21.90  kg/m2  SpO2 98%  Intake/Output Summary (Last 24 hours) at 02/13/15 0836 Last data filed at 02/13/15 0400  Gross per 24 hour  Intake 2404.72 ml  Output    375 ml  Net 2029.72 ml   Filed Weights   02/12/15 1600  Weight: 148 lb 5.9 oz (67.3 kg)    Exam:   General:  NAD, confused, only oriented to self.  Cardiovascular: RRR  Respiratory: CTABL  Abdomen: Soft/ND/NT, positive BS  Musculoskeletal: No Edema  Neuro: moving all extremities spontaneously, confused.  Data Reviewed: Basic Metabolic Panel:  Recent Labs Lab 02/08/15 1235 02/12/15 1216 02/13/15 0410  NA 138 140 140  K 3.7 4.0 3.3*  CL  --  105 107  CO2 24 23 24   GLUCOSE 179* 164* 218*  BUN 43.2* 40* 40*  CREATININE 1.8* 1.66* 1.45*  CALCIUM 8.9 8.8* 8.1*  MG  --   --  2.0   Liver Function Tests:  Recent Labs Lab 02/08/15 1235 02/13/15 0410  AST 19 28  ALT 80* 33  ALKPHOS 137 82  BILITOT 0.58 0.5  PROT 6.2* 4.8*  ALBUMIN 3.1* 2.5*   No results for input(s): LIPASE, AMYLASE in the last 168 hours. No results for input(s): AMMONIA in the last 168 hours. CBC:  Recent Labs Lab 02/08/15 1235 02/12/15 1216 02/13/15 0410  WBC 9.2 6.8 10.8*  NEUTROABS 8.1* 5.9  --   HGB 11.7* 10.7* 10.0*  HCT 34.3* 31.9* 29.4*  MCV 101.8* 102.9* 102.8*  PLT 70* 37* 28*   Cardiac Enzymes:   No results for input(s): CKTOTAL, CKMB, CKMBINDEX, TROPONINI in the last 168 hours. BNP (last 3 results) No results for input(s): BNP in the last 8760  hours.  ProBNP (last 3 results) No results for input(s): PROBNP in the last 8760 hours.  CBG: No results for input(s): GLUCAP in the last 168 hours.  Recent Results (from the past 240 hour(s))  MRSA PCR Screening     Status: None   Collection Time: 02/12/15  4:00 PM  Result Value Ref Range Status   MRSA by PCR NEGATIVE NEGATIVE Final    Comment:        The GeneXpert MRSA Assay (FDA approved for NASAL specimens only), is one component of a comprehensive MRSA  colonization surveillance program. It is not intended to diagnose MRSA infection nor to guide or monitor treatment for MRSA infections.      Studies: Ct Head Wo Contrast  02/12/2015  CLINICAL DATA:  Patient with multiple syncopal episodes and confusion. Dizziness and lightheadedness. EXAM: CT HEAD WITHOUT CONTRAST TECHNIQUE: Contiguous axial images were obtained from the base of the skull through the vertex without intravenous contrast. COMPARISON:  CT brain 01/05/2015 FINDINGS: Ventricles and sulci are prominent. Periventricular and subcortical white matter hypodensity compatible with chronic small vessel ischemic changes. No evidence for acute cortically based infarct, intracranial hemorrhage, mass lesion or mass-effect. Orbits are unremarkable. Mastoid air cells unremarkable. Mucosal thickening within the bilateral maxillary sinuses. IMPRESSION: No acute intracranial process. Chronic small vessel ischemic changes. Electronically Signed   By: Lovey Newcomer M.D.   On: 02/12/2015 13:21   Dg Chest Portable 1 View  02/12/2015  CLINICAL DATA:  Syncope and confusion. Dizziness and lightheadedness. EXAM: PORTABLE CHEST 1 VIEW COMPARISON:  01/05/2015 FINDINGS: The heart size and mediastinal contours are within normal limits. Chronic interstitial coarsening noted. No airspace consolidation. The visualized skeletal structures are unremarkable. IMPRESSION: 1. No acute findings. 2. Chronic interstitial coarsening. Electronically Signed   By: Kerby Moors M.D.   On: 02/12/2015 13:03    Scheduled Meds: . acyclovir  400 mg Oral Daily  . antiseptic oral rinse  7 mL Mouth Rinse BID  . dexamethasone  4 mg Oral Daily  . donepezil  10 mg Oral QHS  . feeding supplement (ENSURE ENLIVE)  237 mL Oral Daily  . memantine  10 mg Oral BID  . multivitamin with minerals  1 tablet Oral Daily  . sodium chloride  3 mL Intravenous Q12H  . tamsulosin  0.4 mg Oral Daily    Continuous Infusions: . sodium chloride 75  mL/hr at 02/12/15 1726  . sodium chloride 75 mL/hr at 02/12/15 1931  . amiodarone 30 mg/hr (02/12/15 2328)     Time spent: 1mns  Maddison Kilner MD, PhD  [facility] Pager [phone] If 7PM-7AM, please contact night-coverage at [email or web address], password [email or web address]11/20/2016, 8:36 AM  LOS: 1 day

## 2015-02-13 NOTE — Progress Notes (Signed)
    Subjective:  Confused and demented   Objective:  Filed Vitals:   02/13/15 0350 02/13/15 0400 02/13/15 0430 02/13/15 0500  BP:  170/81 150/84 151/85  Pulse:      Temp:    98 F (36.7 C)  TempSrc:      Resp: _0 Height:      Weight:      SpO2:        Intake/Output from previous day:  Intake/Output Summary (Last 24 hours) at 02/13/15 0744 Last data filed at 02/13/15 0400  Gross per 24 hour  Intake 2404.72 ml  Output    375 ml  Net 2029.72 ml    Physical Exam: Physical exam: Well-developed well-nourished in no acute distress.  Skin is warm and dry.  HEENT is normal.  Neck is supple.  Chest is clear to auscultation with normal expansion.  Cardiovascular exam is regular rate and rhythm.  Abdominal exam nontender or distended. No masses palpated. Extremities show no edema. neuro Moves ext; demented    Lab Results: Basic Metabolic Panel:  Recent Labs  02/12/15 1216 02/13/15 0410  NA 140 140  K 4.0 3.3*  CL 105 107  CO2 23 24  GLUCOSE 164* 218*  BUN 40* 40*  CREATININE 1.66* 1.45*  CALCIUM 8.8* 8.1*  MG  --  2.0   CBC:  Recent Labs  02/12/15 1216 02/13/15 0410  WBC 6.8 10.8*  NEUTROABS 5.9  --   HGB 10.7* 10.0*  HCT 31.9* 29.4*  MCV 102.9* 102.8*  PLT 37* 28*    Assessment/Plan:  81 yo with history of multiple myeloma, pancytopenia from myeloma treatment, CKD stage IV, and dementia presented with atrial fibrillation with RVR. 1. Atrial fibrillation with RVR: Patient has converted to sinus rhythm. Would continue IV amiodarone today. We will transition to oral amiodarone tomorrow. CHADSVASC = 3.  - He is not going to be a good anticoagulation candidate: fall risk/dementia, thrombocytopenia.  - Echo pending -TSH low; would check free T4 2. Hypotension: Improved this AM. Suspect component of dehydration and cannot rule out sepsis.  - Agree with IV fluid. - Would consider coverage with antibiotics, would get blood cultures.  3.  Dementia: Patient is poorly communicative.   Kirk Ruths 02/13/2015, 7:44 AM

## 2015-02-13 NOTE — Progress Notes (Signed)
  Echocardiogram 2D Echocardiogram has been performed.  Darryl Ryan 02/13/2015, 11:40 AM

## 2015-02-13 NOTE — Progress Notes (Signed)
Utilization Review Completed.Mohsin Crum T11/20/2016  

## 2015-02-13 NOTE — Progress Notes (Signed)
Pt.has history of dementia. Pt.had multiple attempts during the night to get OOB unassisted. Bed alarm was on. Pt.pulled out IV and EKG leads off overnight. Attempted to re-orientation patient to place and situation. Hand mitts were placed to prevent pulling of lines. Pt.had elevated lactic acid levels during the shift. Mid level provider on call with [facility] was paged and notified. Received a total of two 500 mL NS boluses during the shift.  Pt.had an vagal episode when assisted by a NT and another RN at to the [facility]. While patient was attempting to have bm he became briefly unresponsive. Nursing staff and charge nurse assisted patient back into the bed. VS were taken. Patient became responsive again and was able to follow commands. Mid level provider on call was paged and notified. Received a call from lab about critical platelet value of 28. Mid-level provider on call was paged and notified. No new orders written. Was told to continue to monitor for any signs of bleeding.

## 2015-02-14 ENCOUNTER — Inpatient Hospital Stay (HOSPITAL_COMMUNITY)
Admit: 2015-02-14 | Discharge: 2015-02-14 | Disposition: A | Discharge status: Home / Self Care | Payer: Medicare Other | Attending: Internal Medicine | Admitting: Internal Medicine

## 2015-02-14 DIAGNOSIS — R627 Adult failure to thrive: Secondary | ICD-10-CM | POA: Insufficient documentation

## 2015-02-14 DIAGNOSIS — I480 Paroxysmal atrial fibrillation: Secondary | ICD-10-CM

## 2015-02-14 DIAGNOSIS — R569 Unspecified convulsions: Secondary | ICD-10-CM

## 2015-02-14 LAB — CBC
HEMATOCRIT: 30.2 % — AB (ref 39.0–52.0)
HEMOGLOBIN: 10.3 g/dL — AB (ref 13.0–17.0)
MCH: 34.8 pg — AB (ref 26.0–34.0)
MCHC: 34.1 g/dL (ref 30.0–36.0)
MCV: 102 fL — AB (ref 78.0–100.0)
Platelets: 27 10*3/uL — CL (ref 150–400)
RBC: 2.96 MIL/uL — ABNORMAL LOW (ref 4.22–5.81)
RDW: 15.8 % — ABNORMAL HIGH (ref 11.5–15.5)
WBC: 8.2 10*3/uL (ref 4.0–10.5)

## 2015-02-14 LAB — FOLATE RBC
FOLATE, HEMOLYSATE: 425.9 ng/mL
Folate, RBC: 1459 ng/mL (ref >498)
Hematocrit: 29.2 % — ABNORMAL LOW (ref 37.5–51.0)

## 2015-02-14 LAB — BASIC METABOLIC PANEL
Anion gap: 8 (ref 5–15)
BUN: 29 mg/dL — AB (ref 6–20)
CHLORIDE: 109 mmol/L (ref 101–111)
CO2: 23 mmol/L (ref 22–32)
CREATININE: 1.32 mg/dL — AB (ref 0.61–1.24)
Calcium: 8.5 mg/dL — ABNORMAL LOW (ref 8.9–10.3)
GFR calc Af Amer: 53 mL/min — ABNORMAL LOW (ref >60)
GFR calc non Af Amer: 46 mL/min — ABNORMAL LOW (ref >60)
Glucose, Bld: 148 mg/dL — ABNORMAL HIGH (ref 65–99)
Potassium: 4.3 mmol/L (ref 3.5–5.1)
Sodium: 140 mmol/L (ref 135–145)

## 2015-02-14 LAB — HEMOGLOBIN A1C
Hgb A1c MFr Bld: 6.6 % — ABNORMAL HIGH (ref 4.8–5.6)
Mean Plasma Glucose: 143 mg/dL

## 2015-02-14 LAB — MAGNESIUM: Magnesium: 2.1 mg/dL (ref 1.7–2.4)

## 2015-02-14 MED ORDER — LORAZEPAM 2 MG/ML IJ SOLN: 0.5000 mg | Freq: Once | INTRAMUSCULAR | Status: DC

## 2015-02-14 MED ORDER — HYDRALAZINE HCL 20 MG/ML IJ SOLN
20.0000 mg | Freq: Four times a day (QID) | INTRAMUSCULAR | Status: DC | PRN
Start: 2015-02-14 — End: 2015-02-16
  Administered 2015-02-14 – 2015-02-16 (×4): 20 mg via INTRAVENOUS
  Filled 2015-02-14 (×5): qty 1

## 2015-02-14 MED ORDER — AMIODARONE HCL 200 MG PO TABS
400.0000 mg | ORAL_TABLET | Freq: Two times a day (BID) | ORAL | Status: DC
  Administered 2015-02-14 – 2015-02-16 (×5): 400 mg via ORAL
  Filled 2015-02-14 (×5): qty 2

## 2015-02-14 MED ORDER — HALOPERIDOL LACTATE 5 MG/ML IJ SOLN
2.0000 mg | Freq: Once | INTRAMUSCULAR | Status: AC
  Administered 2015-02-14: 2 mg via INTRAVENOUS
  Filled 2015-02-14: qty 1

## 2015-02-14 MED ORDER — AMIODARONE HCL 200 MG PO TABS: 400.0000 mg | ORAL_TABLET | Freq: Every day | ORAL | Status: DC

## 2015-02-14 MED ORDER — HYDRALAZINE HCL 20 MG/ML IJ SOLN
10.0000 mg | Freq: Once | INTRAMUSCULAR | Status: AC
  Administered 2015-02-14: 10 mg via INTRAVENOUS
  Filled 2015-02-14: qty 1

## 2015-02-14 MED ORDER — ENSURE ENLIVE PO LIQD
237.0000 mL | Freq: Two times a day (BID) | ORAL | Status: DC
  Administered 2015-02-14 – 2015-02-16 (×3): 237 mL via ORAL

## 2015-02-14 NOTE — Progress Notes (Signed)
Initial Nutrition Assessment  INTERVENTION:   -Increase Ensure Enlive to po BID, each supplement provides 350 kcal and 20 grams of protein -Provide daily snack -Encourage PO intake -RD to continue to monitor  NUTRITION DIAGNOSIS:   Inadequate oral intake related to lethargy/confusion as evidenced by meal completion < 50%.  GOAL:   Patient will meet greater than or equal to 90% of their needs  MONITOR:   PO intake, Supplement acceptance, Labs, Weight trends, Skin, I & O's  REASON FOR ASSESSMENT:   Consult Assessment of nutrition requirement/status  ASSESSMENT:   81 y.o. male H/o mild dementia, htn, Was recently diagnosed with multiple myeloma under active treatment, per wife patient has showed progressive decline, now has been wheelchair bound in the last two weeks ( he was able to drive in 07/4006). Patient recently started to have "seizure" like activity per wife, he was observed to have whole boyd shaking last for about 20-68mns  Patient unable to provide any history. Wife at bedside. Per pt's wife, he was eating well and was tolerating anything PTA. Over the last few days she has noticed a difference in him and with that diminished appetite. She states he will eat yogurt and is drinking the Ensures provided. RD to order yogurt for bedtime snack.  Pt with some swallowing difficulty, SLP evaluated and recommended regular diet with thin liquids.   Per weight history, weight has fluctuated with recent weight gain.  Nutrition focused physical exam shows no sign of depletion of muscle mass or body fat.  Labs reviewed: Elevated BUN, Creatinine Mg WNL  Diet Order:  DIET SOFT Room service appropriate?: Yes; Fluid consistency:: Thin  Skin:  Reviewed, no issues  Last BM:  11/19  Height:   Ht Readings from Last 1 Encounters:  02/12/15 5' 9" (1.753 m)    Weight:   Wt Readings from Last 1 Encounters:  02/14/15 159 lb 6.3 oz (72.3 kg)    Ideal Body Weight:     BMI:   Body mass index is 23.53 kg/(m^2).  Estimated Nutritional Needs:   Kcal:  1800-2000  Protein:  100-110g  Fluid:  2L/day  EDUCATION NEEDS:   No education needs identified at this time  LClayton Bibles MS, RD, LDN Pager: [phone] Hours Pager: [phone]

## 2015-02-14 NOTE — Progress Notes (Signed)
    Subjective: Alert, confused. No SOB or pain.  Objective: Vital signs in last 24 hours: Temp:  [97.6 F (36.4 C)-98.7 F (37.1 C)] 97.6 F (36.4 C) (11/21 0733) Pulse Rate:  [94-107] 97 (11/21 0800) Resp:  [14-26] 19 (11/21 0800) BP: (100-217)/(62-127) 196/127 mmHg (11/21 0800) SpO2:  [100 %] 100 % (11/21 0800) Weight:  [159 lb 6.3 oz (72.3 kg)] 159 lb 6.3 oz (72.3 kg) (11/21 0500) Last BM Date: 02/12/15  Intake/Output from previous day: 11/20 0701 - 11/21 0700 In: 1904.6 [P.O.:240; I.V.:1264.6; IV Piggyback:400] Out: 450 [Urine:450] Intake/Output this shift: Total I/O In: 720 [I.V.:720] Out: -   Medications Scheduled Meds: . acyclovir  400 mg Oral Daily  . antiseptic oral rinse  7 mL Mouth Rinse BID  . dexamethasone  4 mg Oral Daily  . donepezil  10 mg Oral QHS  . feeding supplement (ENSURE ENLIVE)  237 mL Oral Daily  . memantine  10 mg Oral BID  . multivitamin with minerals  1 tablet Oral Daily  . sodium chloride  3 mL Intravenous Q12H  . tamsulosin  0.4 mg Oral Daily   Continuous Infusions: . sodium chloride 10 mL/hr at 02/13/15 2000  . sodium chloride 75 mL/hr at 02/13/15 2000  . amiodarone 30 mg/hr (02/14/15 0848)   PRN Meds:.acetaminophen, diphenhydrAMINE, hydrALAZINE, LORazepam  PE: General appearance: alert, cooperative, no distress and Seems a little aggitated Lungs: clear to auscultation bilaterally Heart: regular rate and rhythm, S1, S2 normal, no murmur, click, rub or gallop Abdomen: +BS, tense, nontender Extremities: No LEE Pulses: 2+ and symmetric Skin: No LEE Neurologic:   Alert, cooperative, not oriented.  Lab Results:   Recent Labs  02/12/15 1216 02/13/15 0410 02/14/15 0351  WBC 6.8 10.8* 8.2  HGB 10.7* 10.0* 10.3*  HCT 31.9* 29.4* 30.2*  PLT 37* 28* 27*   BMET  Recent Labs  02/12/15 1216 02/13/15 0410 02/14/15 0351  NA 140 140 140  K 4.0 3.3* 4.3  CL 105 107 109  CO2 $Re'23 24 23  'UfD$ GLUCOSE 164* 218* 148*  BUN 40* 40* 29*    CREATININE 1.66* 1.45* 1.32*  CALCIUM 8.8* 8.1* 8.5*   PT/INR  Recent Labs  02/13/15 0410  LABPROT 14.9  INR 1.15      Assessment/Plan   81 yo with history of multiple myeloma, pancytopenia from myeloma treatment, CKD stage IV, and dementia presented with atrial fibrillation with RVR.  1. Atrial fibrillation with RVR:  Still maintaining sinus rhythm.  Change to PO amiodarone today.  CHADSVASC = 3.  - He is not going to be a good anticoagulation candidate: fall risk/dementia, thrombocytopenia.  - Echo:  EF 55-60%.  No WMA. G1DD,  LA mildly dilated, trivial pericardial effusion.  -TSH low; T4 WNL  2. Hypotension:  - He is hypertensive now.  Improved after hydralazine.  Blood cultures with no growth.  UA unremarkable.    3. Dementia: Patient is poorly communicative.    LOS: 2 days    HAGER, BRYAN PA-C 02/14/2015 8:52 AM As above, patient seen and examined; patient does not respond to questions this AM; he remains in sinus. Would change amiodarone to po; continue amiodarone 400 BID for 2 weeks and then 200 mg daily. Not a candidate for anticoagulation. Other issues per primary care. Kirk Ruths

## 2015-02-14 NOTE — Procedures (Signed)
ELECTROENCEPHALOGRAM REPORT  Date of Study: 02/14/2015  Patient's Name: Darryl Ryan MRN: [id number] Date of Birth: 21-Dec-1923  Referring Provider: Dr. Rosalin Hawking  Clinical History: This is an 81 year old man with an episode of whole body shaking.   Medications: acetaminophen (TYLENOL) tablet 650 mg acyclovir (ZOVIRAX) tablet 400 mg amiodarone (PACERONE) tablet 400 mg dexamethasone (DECADRON) tablet 4 mg diphenhydrAMINE (BENADRYL) injection 12.5 mg donepezil (ARICEPT) tablet 10 mg hydrALAZINE (APRESOLINE) injection 20 mg LORazepam (ATIVAN) injection 0.5 mg memantine (NAMENDA) tablet 10 mg multivitamin with minerals tablet 1 tablet tamsulosin (FLOMAX) capsule 0.4 mg  Technical Summary: A multichannel digital EEG recording measured by the international 10-20 system with electrodes applied with paste and impedances below 5000 ohms performed as portable with EKG monitoring in an awake and asleep patient.  Hyperventilation and photic stimulation were not performed.  The digital EEG was referentially recorded, reformatted, and digitally filtered in a variety of bipolar and referential montages for optimal display.   Description: The patient is awake and asleep during the recording.  There is no clear posterior dominant rhythm. The background consists of a moderate amount of diffuse 4-5 Hz theta slowing.  During drowsiness and sleep, there is an increase in theta and delta slowing of the background with rare vertex waves seen. Hyperventilation and photic stimulation were not performed. Patient noted by technician to have whole body twitching and tremulousness during the study, with no associated electrographic correlate.  There were no epileptiform discharges or electrographic seizures seen.   EKG lead was unremarkable.  Impression: This awake and asleep EEG is abnormal due to mild to moderate diffuse slowing of the background.  Clinical Correlation of the above findings indicates  diffuse cerebral dysfunction that is non-specific in etiology and can be seen with hypoxic/ischemic injury, toxic/metabolic encephalopathies, or medication effect.  Body twitching captured did not show any electrographic correlate. Clinical correlation is advised.   Ellouise Newer, M.D.

## 2015-02-14 NOTE — Progress Notes (Signed)
EEG completed, results pending. 

## 2015-02-14 NOTE — Progress Notes (Signed)
PROGRESS NOTE  Darryl Ryan MPN:[id number] DOB: 1924/03/12 DOA: 02/12/2015 PCP: Mathews Argyle, MD  HPI/Recap of past 24 hours:  Converted to sinus rhythm on amiodarone drip, Had an likely vasovagal episode last night while on toilet Confused  Assessment/Plan: Active Problems:   Atrial fibrillation with RVR ([place])   A-fib ([place])   Faintness   Multiple myeloma without remission ([place])   Hypokalemia   Lactic acidosis   Thrombocytopenia ([facility])  Afib/RVR:  newonset, converted to sinus rhythm on amiodarone drip, due to thrombocytopenia, not a candidate for anticoagulation.echo lvef 55%, no wall motion abnormality, grade 1 diastolic dysfunction. Transitioned to oral amio per cardiology, will f/u on cardiology recommendation.  Confusion/agitation with baseline dementia: will repeat ct head if not improving.  Suppressed tsh, free t4 wnl.  Syncope with "seizure" like activity: start seizure and aspiration precaution,  EEG nonspecific changes, ct head no acute findings. No seizure observed in the hospital.  Lactic acidosis: no overt sign of infection, from poor perfusion due to afib/rvr/hypotension? ua unremarkable, blood culture no growth, cxr no infiltrate, no fever, no leukocytosis.  Normalized.  CKD III: stable at baseline, ua unremarkable, renal dosing meds.  Thrombocytopenia: from multiple myeloma vs chemo. No active bleed, close monitor, transfuse platelet if plt less than 10k.  Multiple myeloma: off revlimid due to pancytopenia, on bortezomib, last does yestderday on 11/8. Reported diffuse bone pain, prn pain meds. Continue daily decadron, and acyclovir prophylaxis.  I have talked to patient's primary oncologist Dr. Alvy Bimler, she agrees to palliative care consult.  FTT/generalized weakness/malnutrition: nutrition consult, will need PT once stabilized.   Loose stool: wife reported given him miralax prior to admission, monitor.  Hypokalemia: replace k, check  mag  Elevated am blood glucose, likely steroid induced,  a1c 6.6, carb modified diet for now.  DVT prophylaxis: scd's  Consultants:  Cardiology Oncology over the phone Palliative care  Code Status: DNR  Family Communication: Patient and wife  Procedures:  none  Antibiotics:  none   Objective: BP 165/95 mmHg  Pulse 93  Temp(Src) 98.6 F (37 C) (Oral)  Resp 18  Ht _0  (1.753 m)  Wt 159 lb 6.3 oz (72.3 kg)  BMI 23.53 kg/m2  SpO2 100%  Intake/Output Summary (Last 24 hours) at 02/14/15 1737 Last data filed at 02/14/15 1600  Gross per 24 hour  Intake 3608.44 ml  Output     75 ml  Net 3533.44 ml   Filed Weights   02/12/15 1600 02/14/15 0500  Weight: 148 lb 5.9 oz (67.3 kg) 159 lb 6.3 oz (72.3 kg)    Exam:   General:  NAD, confused, only oriented to self.  Cardiovascular: RRR  Respiratory: CTABL  Abdomen: Soft/ND/NT, positive BS  Musculoskeletal: No Edema  Neuro: moving all extremities spontaneously, confused.  Data Reviewed: Basic Metabolic Panel:  Recent Labs Lab 02/08/15 1235 02/12/15 1216 02/13/15 0410 02/13/15 0922 02/14/15 0351  NA 138 140 140  --  140  K 3.7 4.0 3.3*  --  4.3  CL  --  105 107  --  109  CO2 _1 --  23  GLUCOSE 179* 164* 218*  --  148*  BUN 43.2* 40* 40*  --  29*  CREATININE 1.8* 1.66* 1.45*  --  1.32*  CALCIUM 8.9 8.8* 8.1*  --  8.5*  MG  --   --  2.0 1.9 2.1   Liver Function Tests:  Recent Labs Lab 02/08/15 1235 02/13/15 0410  AST 19 28  ALT 80* 33  ALKPHOS 137 82  BILITOT 0.58 0.5  PROT 6.2* 4.8*  ALBUMIN 3.1* 2.5*   No results for input(s): LIPASE, AMYLASE in the last 168 hours. No results for input(s): AMMONIA in the last 168 hours. CBC:  Recent Labs Lab 02/08/15 1235 02/12/15 1216 02/13/15 0410 02/14/15 0351  WBC 9.2 6.8 10.8* 8.2  NEUTROABS 8.1* 5.9  --   --   HGB 11.7* 10.7* 10.0* 10.3*  HCT 34.3* 31.9* 29.4*  29.2* 30.2*  MCV 101.8* 102.9* 102.8* 102.0*  PLT 70* 37* 28* 27*    Cardiac Enzymes:   No results for input(s): CKTOTAL, CKMB, CKMBINDEX, TROPONINI in the last 168 hours. BNP (last 3 results) No results for input(s): BNP in the last 8760 hours.  ProBNP (last 3 results) No results for input(s): PROBNP in the last 8760 hours.  CBG: No results for input(s): GLUCAP in the last 168 hours.  Recent Results (from the past 240 hour(s))  MRSA PCR Screening     Status: None   Collection Time: 02/12/15  4:00 PM  Result Value Ref Range Status   MRSA by PCR NEGATIVE NEGATIVE Final    Comment:        The GeneXpert MRSA Assay (FDA approved for NASAL specimens only), is one component of a comprehensive MRSA colonization surveillance program. It is not intended to diagnose MRSA infection nor to guide or monitor treatment for MRSA infections.   Blood culture (routine x 2)     Status: None (Preliminary result)   Collection Time: 02/12/15  4:37 PM  Result Value Ref Range Status   Specimen Description RIGHT ANTECUBITAL  Final   Special Requests BOTTLES DRAWN AEROBIC AND ANAEROBIC 10CC  Final   Culture   Final    NO GROWTH 2 DAYS Performed at [facility]    Report Status PENDING  Incomplete  Blood culture (routine x 2)     Status: None (Preliminary result)   Collection Time: 02/12/15  4:42 PM  Result Value Ref Range Status   Specimen Description BLOOD LEFT HAND  Final   Special Requests BOTTLES DRAWN AEROBIC AND ANAEROBIC 10CC  Final   Culture   Final    NO GROWTH 2 DAYS Performed at [facility]    Report Status PENDING  Incomplete     Studies: No results found.  Scheduled Meds: . acyclovir  400 mg Oral Daily  . amiodarone  400 mg Oral BID  . antiseptic oral rinse  7 mL Mouth Rinse BID  . dexamethasone  4 mg Oral Daily  . donepezil  10 mg Oral QHS  . feeding supplement (ENSURE ENLIVE)  237 mL Oral BID BM  . LORazepam  0.5 mg Intravenous Once  . memantine  10 mg Oral BID  . multivitamin with minerals  1 tablet Oral Daily  .  sodium chloride  3 mL Intravenous Q12H  . tamsulosin  0.4 mg Oral Daily    Continuous Infusions: . sodium chloride 10 mL/hr at 02/13/15 2000  . sodium chloride 75 mL/hr at 02/13/15 2000     Time spent: 17mns  Dariane Natzke MD, PhD  [facility] Pager [phone] If 7PM-7AM, please contact night-coverage at [email or web address], password [email or web address]11/21/2016, 5:37 PM  LOS: 2 days

## 2015-02-15 DIAGNOSIS — Z515 Encounter for palliative care: Secondary | ICD-10-CM

## 2015-02-15 LAB — BASIC METABOLIC PANEL
ANION GAP: 7 (ref 5–15)
BUN: 28 mg/dL — ABNORMAL HIGH (ref 6–20)
CALCIUM: 8.6 mg/dL — AB (ref 8.9–10.3)
CO2: 24 mmol/L (ref 22–32)
Chloride: 112 mmol/L — ABNORMAL HIGH (ref 101–111)
Creatinine, Ser: 1.22 mg/dL (ref 0.61–1.24)
GFR, EST AFRICAN AMERICAN: 59 mL/min — AB (ref >60)
GFR, EST NON AFRICAN AMERICAN: 51 mL/min — AB (ref >60)
GLUCOSE: 111 mg/dL — AB (ref 65–99)
Potassium: 4.1 mmol/L (ref 3.5–5.1)
SODIUM: 143 mmol/L (ref 135–145)

## 2015-02-15 LAB — URINALYSIS, ROUTINE W REFLEX MICROSCOPIC
Glucose, UA: NEGATIVE mg/dL
KETONES UR: 15 mg/dL — AB
NITRITE: POSITIVE — AB
PH: 5.5 (ref 5.0–8.0)
Protein, ur: 100 mg/dL — AB
Specific Gravity, Urine: 1.021 (ref 1.005–1.030)

## 2015-02-15 LAB — URINE MICROSCOPIC-ADD ON: Bacteria, UA: NONE SEEN

## 2015-02-15 LAB — PROCALCITONIN: PROCALCITONIN: 0.42 ng/mL

## 2015-02-15 LAB — CBC
HCT: 28.5 % — ABNORMAL LOW (ref 39.0–52.0)
Hemoglobin: 9.6 g/dL — ABNORMAL LOW (ref 13.0–17.0)
MCH: 34.8 pg — ABNORMAL HIGH (ref 26.0–34.0)
MCHC: 33.7 g/dL (ref 30.0–36.0)
MCV: 103.3 fL — ABNORMAL HIGH (ref 78.0–100.0)
PLATELETS: 39 10*3/uL — AB (ref 150–400)
RBC: 2.76 MIL/uL — ABNORMAL LOW (ref 4.22–5.81)
RDW: 16.1 % — AB (ref 11.5–15.5)
WBC: 7.8 10*3/uL (ref 4.0–10.5)

## 2015-02-15 LAB — MAGNESIUM: MAGNESIUM: 2.1 mg/dL (ref 1.7–2.4)

## 2015-02-15 MED ORDER — DIPHENHYDRAMINE HCL 12.5 MG/5ML PO ELIX
12.5000 mg | ORAL_SOLUTION | Freq: Three times a day (TID) | ORAL | Status: DC | PRN
  Administered 2015-02-15: 12.5 mg via ORAL
  Filled 2015-02-15: qty 5

## 2015-02-15 MED ORDER — HYDRALAZINE HCL 20 MG/ML IJ SOLN
10.0000 mg | Freq: Once | INTRAMUSCULAR | Status: AC
Start: 2015-02-15 — End: 2015-02-15
  Administered 2015-02-15: 10 mg via INTRAVENOUS
  Filled 2015-02-15: qty 1

## 2015-02-15 NOTE — Progress Notes (Signed)
Transferred to 1519 via bed wife at bedside

## 2015-02-15 NOTE — Progress Notes (Signed)
Report called to Zachery Conch Rn

## 2015-02-15 NOTE — Progress Notes (Addendum)
Report received from April, RN. Agree with previous shift assessment. BP 132/68. Patient stable without complaints. Darryl Ryan. Brigitte Pulse, RN'

## 2015-02-15 NOTE — Progress Notes (Signed)
PHARMACIST - PHYSICIAN COMMUNICATION  Key Points: Use following P&T approved IV to PO diphenhydramine (Benadryl) policy. Description contains the criteria that are approved  DR:   Erlinda Hong CONCERNING: IV to Oral Route Change Policy  RECOMMENDATION: This patient is receiving diphenhydramine by the intravenous route.  Based on criteria approved by the Pharmacy and Therapeutics Committee, intravenous diphenhydramine is being converted to the equivalent oral dose form(s).   DESCRIPTION: These criteria include:  Diphenhydramine is not prescribed to treat or prevent a severe allergic reaction  Diphenhydramine is not prescribed as premedication prior to receiving blood product, biologic medication, antimicrobial, or chemotherapy agent  The patient has tolerated at least one dose of an oral or enteral medication  The patient has no evidence of active gastrointestinal bleeding or impaired GI absorption (gastrectomy, short bowel, patient on TNA or NPO).  The patient is not undergoing procedural sedation   If you have questions about this conversion, please contact the Pharmacy Department  []   [phone] )  [facility] []   [phone] )  [facility] []   [phone] )  [facility] []   [phone] )  [facility] [x]   [phone] )  [facility]

## 2015-02-15 NOTE — Progress Notes (Signed)
Received report from [facility]. Agreed with her assessment. Patient has confusion on and off,  slightly agitated. Will continue to monitor patient.

## 2015-02-15 NOTE — Consult Note (Signed)
Consultation Note Date: 02/15/2015   Patient Name: Darryl Ryan  DOB: 1923/04/10  MRN: [id number]  Age / Sex: 81 y.o., male  PCP: Lajean Manes, MD Referring Physician: Florencia Reasons, MD  Reason for Consultation: Establishing goals of care and Psychosocial/spiritual support    Clinical Assessment/Narrative:   This NP Wadie Lessen reviewed medical records, received report from team, assessed the patient and then meet at the patient's bedside along with his wife  to discuss diagnosis, prognosis, GOC, EOL wishes disposition and options.   A detailed discussion was had today regarding advanced directives.  Concepts specific to code status, artifical feeding and hydration, continued IV antibiotics and rehospitalization was had.  The difference between a aggressive medical intervention path  and a palliative comfort care path for this patient at this time was had.  Values and goals of care important to patient and family were attempted to be elicited.  Concept of Hospice and Palliative Care were discussed  Natural trajectory and expectations at EOL were discussed.  Questions and concerns addressed.  Hard Choices booklet left for review. Family encouraged to call with questions or concerns.  PMT will continue to support holistically.   H/o mild dementia, HTN, recently diagnosed with multiple myeloma under active treatment/Dr Alvy Bimler, per wife patient has showed progressive decline, now has been wheelchair bound in the last two weeks ( he was able to drive in 09/5168). Overall failure to thrive, multiple re-hospitalizations.   Family faced with advanced directive decisions and anticipatory care needs   Contacts/Participants in Discussion: wife Primary Decision Maker:    Faith Rogue Relationship to Patient  Wife HCPOA: yes    SUMMARY OF RECOMMENDATIONS  - treat the treatable, hopeful for improvement -family  is realistic about overall poor prognosis but cannot make shift to full comfort at this time - discharge home when medically stable with continued home health service, "I just need to try one more time"    Code Status/Advance Care Planning:  DNR      Code Status Orders        Start     Ordered   02/12/15 1811  Do not attempt resuscitation (DNR)   Continuous    Question Answer Comment  In the event of cardiac or respiratory ARREST Do not call a "code blue"   In the event of cardiac or respiratory ARREST Do not perform Intubation, CPR, defibrillation or ACLS   In the event of cardiac or respiratory ARREST Use medication by any route, position, wound care, and other measures to relive pain and suffering. May use oxygen, suction and manual treatment of airway obstruction as needed for comfort.      02/12/15 1810    Advance Directive Documentation        Most Recent Value   Type of Advance Directive  Living will   Pre-existing out of facility DNR order (yellow form or pink MOST form)     "MOST" Form in Place?        Other Directives:Advanced Directive and Living Will                                 MOST form introduced   Palliative Prophylaxis:   Aspiration, Bowel Regimen, Delirium Protocol, Frequent Pain Assessment, Oral Care and Turn Reposition    Psycho-social/Spiritual:  Support System: Strong Desire for further Chaplaincy support:no-strong community church support Additional Recommendations: Education on Hospice  Prognosis: Dependant on desire  for life prolonging intervetnions  Discharge Planning: Home with Home Health   Chief Complaint/ Primary Diagnoses: Present on Admission:  . Atrial fibrillation with RVR ([place]) . A-fib [facility])  I have reviewed the medical record, interviewed the patient and family, and examined the patient. The following aspects are pertinent.  Past Medical History  Diagnosis Date  . Hypertension   . Dementia   . Hepatitis B   .  Dysphasia   . Chronic kidney disease   . Cancer ([place])   . Seizure ([place]) 02/11/2015   Social History   Social History  . Marital Status: Married    Spouse Name: N/A  . Number of Children: N/A  . Years of Education: N/A   Social History Main Topics  . Smoking status: Never Smoker   . Smokeless tobacco: Never Used  . Alcohol Use: No  . Drug Use: No  . Sexual Activity: No   Other Topics Concern  . None   Social History Narrative   Family History  Problem Relation Age of Onset  . Family history unknown: Yes   Scheduled Meds: . acyclovir  400 mg Oral Daily  . amiodarone  400 mg Oral BID  . antiseptic oral rinse  7 mL Mouth Rinse BID  . dexamethasone  4 mg Oral Daily  . donepezil  10 mg Oral QHS  . feeding supplement (ENSURE ENLIVE)  237 mL Oral BID BM  . LORazepam  0.5 mg Intravenous Once  . memantine  10 mg Oral BID  . multivitamin with minerals  1 tablet Oral Daily  . sodium chloride  3 mL Intravenous Q12H  . tamsulosin  0.4 mg Oral Daily   Continuous Infusions: . sodium chloride 10 mL/hr at 02/13/15 2000  . sodium chloride 75 mL/hr at 02/15/15 0349   PRN Meds:.acetaminophen, diphenhydrAMINE, hydrALAZINE, LORazepam Medications Prior to Admission:  Prior to Admission medications   Medication Sig Start Date End Date Taking? Authorizing Provider  acetaminophen (TYLENOL) 500 MG tablet Take 1,000 mg by mouth every 6 (six) hours as needed for moderate pain.   Yes Historical Provider, MD  acyclovir (ZOVIRAX) 400 MG tablet Take 1 tablet (400 mg total) by mouth daily. 12/16/14  Yes Heath Lark, MD  aspirin 81 MG tablet Take 81 mg by mouth daily.   Yes Historical Provider, MD  dexamethasone (DECADRON) 4 MG tablet TAKE 1 TABLET BY MOUTH EVERY DAY Patient taking differently: Take 1 tablet by mouth every day. 02/11/15  Yes Heath Lark, MD  donepezil (ARICEPT) 10 MG tablet Take 10 mg by mouth at bedtime.   Yes Historical Provider, MD  doxazosin (CARDURA) 8 MG tablet Take 8 mg by  mouth daily. 12/21/14  Yes Historical Provider, MD  feeding supplement (BOOST HIGH PROTEIN) LIQD Take 1 Container by mouth every morning.   Yes Historical Provider, MD  memantine (NAMENDA) 10 MG tablet Take 10 mg by mouth 2 (two) times daily.   Yes Historical Provider, MD  metoprolol tartrate (LOPRESSOR) 25 MG tablet Take 25 mg by mouth 2 (two) times daily.   Yes Historical Provider, MD  Multiple Vitamin (MULTIVITAMIN WITH MINERALS) TABS tablet Take 1 tablet by mouth daily.   Yes Historical Provider, MD  polyethylene glycol (MIRALAX / GLYCOLAX) packet Take 17 g by mouth daily as needed.   Yes Historical Provider, MD  hydrALAZINE (APRESOLINE) 10 MG tablet Take 1 tablet (10 mg total) by mouth every 8 (eight) hours. Patient not taking: Reported on 02/12/2015 12/14/14   Theodis Blaze,  MD  lenalidomide (REVLIMID) 2.5 MG capsule 1 capsule daily for 14 days then rest 7 days Patient not taking: Reported on 02/01/2015 01/11/15   Heath Lark, MD  ondansetron (ZOFRAN) 8 MG tablet Take 1 tablet (8 mg total) by mouth every 8 (eight) hours as needed for nausea. Patient not taking: Reported on 01/11/2015 12/16/14   Heath Lark, MD  oxyCODONE (OXY IR/ROXICODONE) 5 MG immediate release tablet Take 1 tablet (5 mg total) by mouth every 4 (four) hours as needed for moderate pain. Patient not taking: Reported on 01/05/2015 12/14/14   Theodis Blaze, MD  prochlorperazine (COMPAZINE) 10 MG tablet Take 1 tablet (10 mg total) by mouth every 6 (six) hours as needed (Nausea or vomiting). Patient not taking: Reported on 01/11/2015 12/16/14   Heath Lark, MD   Allergies  Allergen Reactions  . Bee Venom Anaphylaxis  . Oxycodone     Makes patient crazy   . Tramadol Hives    Review of Systems  Unable to perform ROS   Physical Exam  Constitutional: He appears well-developed and well-nourished.  HENT:  Head: Normocephalic and atraumatic.  Cardiovascular: Normal rate and regular rhythm.   Respiratory: Effort normal and breath  sounds normal.  Neurological: He is alert.  Pleasantly confused  Skin: Skin is warm and dry.    Vital Signs: BP 123/54 mmHg  Pulse 45  Temp(Src) 98.1 F (36.7 C) (Oral)  Resp 27  Ht 5' 9"  (1.753 m)  Wt 72.3 kg (159 lb 6.3 oz)  BMI 23.53 kg/m2  SpO2 90%  SpO2: SpO2: 90 % O2 Device:SpO2: 90 % O2 Flow Rate: .   IO: Intake/output summary:  Intake/Output Summary (Last 24 hours) at 02/15/15 1244 Last data filed at 02/15/15 0600  Gross per 24 hour  Intake   1530 ml  Output    975 ml  Net    555 ml    LBM: Last BM Date: 02/12/15 Baseline Weight: Weight: 67.3 kg (148 lb 5.9 oz) Most recent weight: Weight: 72.3 kg (159 lb 6.3 oz)      Palliative Assessment/Data:  Flowsheet Rows        Most Recent Value   Intake Tab    Referral Department  Hospitalist   Unit at Time of Referral  ICU   Palliative Care Primary Diagnosis  Cancer   Date Notified  02/14/15   Palliative Care Type  New Palliative care   Reason for referral  Clarify Goals of Care   Date of Admission  02/12/15   # of days IP prior to Palliative referral  2   Clinical Assessment    Psychosocial & Spiritual Assessment    Palliative Care Outcomes       Additional Data Reviewed:  CBC:    Component Value Date/Time   WBC 7.8 02/15/2015 0345   WBC 9.2 02/08/2015 1235   HGB 9.6* 02/15/2015 0345   HGB 11.7* 02/08/2015 1235   HCT 28.5* 02/15/2015 0345   HCT 29.2* 02/13/2015 0410   HCT 34.3* 02/08/2015 1235   PLT 39* 02/15/2015 0345   PLT 70* 02/08/2015 1235   MCV 103.3* 02/15/2015 0345   MCV 101.8* 02/08/2015 1235   NEUTROABS 5.9 02/12/2015 1216   NEUTROABS 8.1* 02/08/2015 1235   LYMPHSABS 0.5* 02/12/2015 1216   LYMPHSABS 0.6* 02/08/2015 1235   MONOABS 0.4 02/12/2015 1216   MONOABS 0.6 02/08/2015 1235   EOSABS 0.0 02/12/2015 1216   EOSABS 0.0 02/08/2015 1235   BASOSABS 0.0 02/12/2015 1216   BASOSABS  0.0 02/08/2015 1235   Comprehensive Metabolic Panel:    Component Value Date/Time   NA 143 02/15/2015  0345   NA 138 02/08/2015 1235   K 4.1 02/15/2015 0345   K 3.7 02/08/2015 1235   CL 112* 02/15/2015 0345   CO2 24 02/15/2015 0345   CO2 24 02/08/2015 1235   BUN 28* 02/15/2015 0345   BUN 43.2* 02/08/2015 1235   CREATININE 1.22 02/15/2015 0345   CREATININE 1.8* 02/08/2015 1235   GLUCOSE 111* 02/15/2015 0345   GLUCOSE 179* 02/08/2015 1235   CALCIUM 8.6* 02/15/2015 0345   CALCIUM 8.9 02/08/2015 1235   AST 28 02/13/2015 0410   AST 19 02/08/2015 1235   ALT 33 02/13/2015 0410   ALT 80* 02/08/2015 1235   ALKPHOS 82 02/13/2015 0410   ALKPHOS 137 02/08/2015 1235   BILITOT 0.5 02/13/2015 0410   BILITOT 0.58 02/08/2015 1235   PROT 4.8* 02/13/2015 0410   PROT 6.2* 02/08/2015 1235   ALBUMIN 2.5* 02/13/2015 0410   ALBUMIN 3.1* 02/08/2015 1235     Time In: 0830 Time Out: 1000 Time Total: 90 min Greater than 50%  of this time was spent counseling and coordinating care related to the above assessment and plan.  Signed by: Wadie Lessen, NP  Knox Royalty, NP  02/15/2015, 12:44 PM  Please contact Palliative Medicine Team phone at [phone] for questions and concerns.

## 2015-02-15 NOTE — Progress Notes (Signed)
    Subjective: No complaints.  Objective: Vital signs in last 24 hours: Temp:  [97.5 F (36.4 C)-98.6 F (37 C)] 97.6 F (36.4 C) (11/22 0423) Pulse Rate:  [86-101] 86 (11/22 0720) Resp:  [15-24] 16 (11/22 0720) BP: (139-203)/(64-127) 158/81 mmHg (11/22 0720) SpO2:  [99 %-100 %] 99 % (11/22 0720) Last BM Date: 02/12/15  Intake/Output from previous day: 11/21 0701 - 11/22 0700 In: 4210.8 [P.O.:620; I.V.:3590.8] Out: 975 [Urine:975] Intake/Output this shift:    Medications Scheduled Meds: . acyclovir  400 mg Oral Daily  . amiodarone  400 mg Oral BID  . antiseptic oral rinse  7 mL Mouth Rinse BID  . dexamethasone  4 mg Oral Daily  . donepezil  10 mg Oral QHS  . feeding supplement (ENSURE ENLIVE)  237 mL Oral BID BM  . LORazepam  0.5 mg Intravenous Once  . memantine  10 mg Oral BID  . multivitamin with minerals  1 tablet Oral Daily  . sodium chloride  3 mL Intravenous Q12H  . tamsulosin  0.4 mg Oral Daily   Continuous Infusions: . sodium chloride 10 mL/hr at 02/13/15 2000  . sodium chloride 75 mL/hr at 02/15/15 0349   PRN Meds:.acetaminophen, diphenhydrAMINE, hydrALAZINE, LORazepam  PE: General appearance: alert, cooperative, no distress and He was sitting on the [facility] Lungs: clear to auscultation bilaterally Heart: regular rate and rhythm, S1, S2 normal, no murmur, click, rub or gallop Extremities: No LEE Pulses: 2+ and symmetric Skin: warm and dry Neurologic: Grossly normal  Lab Results:   Recent Labs  02/13/15 0410 02/14/15 0351 02/15/15 0345  WBC 10.8* 8.2 7.8  HGB 10.0* 10.3* 9.6*  HCT 29.4*  29.2* 30.2* 28.5*  PLT 28* 27* 39*   BMET  Recent Labs  02/13/15 0410 02/14/15 0351 02/15/15 0345  NA 140 140 143  K 3.3* 4.3 4.1  CL 107 109 112*  CO2 _0 GLUCOSE 218* 148* 111*  BUN 40* 29* 28*  CREATININE 1.45* 1.32* 1.22  CALCIUM 8.1* 8.5* 8.6*   PT/INR  Recent Labs  02/13/15 0410  LABPROT 14.9  INR 1.15     Assessment/Plan        Atrial fibrillation with RVR ([facility]) Continues to maintain NSR.  Infrequent PVCs.   Amiodarone change to 466m BID yesterday.  Continue and change to 2021mdaily on Dec 5.  -CHADSVASC = 3.  He is not going to be a good anticoagulation candidate: fall risk/dementia, thrombocytopenia.  - Echo: EF 55-60%. No WMA. G1DD, LA mildly dilated, trivial pericardial effusion.  -TSH low; T4 WNL  Hypotension:  - controlled this morning. Blood cultures with no growth. UA unremarkable.     Dementia   Faintness   Multiple myeloma without remission ([facility])   Hypokalemia: WNL now.   Lactic acidosis   Thrombocytopenia ([facility])   FTT (failure to thrive) in adult    LOS: 3 days    HAGER, BRYAN PA-C 02/15/2015 8:01 AM  As above; patient seen and examined; remains in sinus; continue amiodarone as outlined above; no anticoagulation given thrombocytopenia and fall risk; will sign off; please call with questions. BrKirk Ruths

## 2015-02-15 NOTE — Progress Notes (Signed)
PROGRESS NOTE  Darryl Ryan JSH:[id number] DOB: 08/05/1923 DOA: 02/12/2015 PCP: Mathews Argyle, MD  HPI/Recap of past 24 hours:  No agitation, calm and pleasant, wife and a friend in room Noticed some Sediment in foley  Assessment/Plan: Active Problems:   Atrial fibrillation with RVR ([place])   A-fib ([place])   Faintness   Multiple myeloma without remission ([place])   Hypokalemia   Lactic acidosis   Thrombocytopenia ([facility])   FTT (failure to thrive) in adult   Palliative care encounter  Afib/RVR:  newonset, converted to sinus rhythm on amiodarone drip, due to thrombocytopenia, not a candidate for anticoagulation.echo lVEF 55%, no wall motion abnormality, grade 1 diastolic dysfunction. Transitioned to oral amio per cardiology, will f/u on cardiology recommendation.  Confusion/agitation with baseline dementia: improving,   Suppressed tsh, free t4 wnl.  Syncope with "seizure" like activity: start seizure and aspiration precaution,  EEG nonspecific changes, ct head no acute findings. No seizure observed in the hospital.  Lactic acidosis: no overt sign of infection, from poor perfusion due to afib/rvr/hypotension? ua unremarkable, blood culture no growth, cxr no infiltrate, no fever, no leukocytosis.  Normalized.  CKD III:  Cr 1.66 on admission, 1.22 on 11/22 ua unremarkable on admission, renal dosing meds.   Thrombocytopenia: from multiple myeloma vs chemo. No active bleed, close monitor, transfuse platelet if plt less than 10k. Slightly better today.  Multiple myeloma: off revlimid due to pancytopenia, on bortezomib, last does yestderday on 11/8. Reported diffuse bone pain, prn pain meds. Continue daily decadron, and acyclovir prophylaxis.  I have talked to patient's primary oncologist Dr. Alvy Bimler, she agrees to palliative care consult.  FTT/generalized weakness/malnutrition: nutrition consult,  PT consult, wife does not want SNF, want home health at discharge  Loose  stool: wife reported given him miralax prior to admission, monitor.  Hypokalemia: replace k, mag wnl  Elevated am blood glucose, likely steroid induced,  a1c 6.6, carb modified diet for now.  DVT prophylaxis: scd's  Consultants:  Cardiology Oncology over the phone Palliative care  Code Status: DNR  Family Communication: Patient and wife  Disposition: transfer out of stepdown on 11/22, likely home with home health in 1-2 days  Procedures:  EEG  Antibiotics:  none   Objective: BP 123/54 mmHg  Pulse 45  Temp(Src) 97.7 F (36.5 C) (Oral)  Resp 27  Ht 5' 9"  (1.753 m)  Wt 159 lb 6.3 oz (72.3 kg)  BMI 23.53 kg/m2  SpO2 90%  Intake/Output Summary (Last 24 hours) at 02/15/15 1411 Last data filed at 02/15/15 0945  Gross per 24 hour  Intake   1770 ml  Output    975 ml  Net    795 ml   Filed Weights   02/12/15 1600 02/14/15 0500  Weight: 148 lb 5.9 oz (67.3 kg) 159 lb 6.3 oz (72.3 kg)    Exam:   General:  NAD, no agitation this am, calm and pleasant, oriented to person only.  Cardiovascular: RRR  Respiratory: CTABL  Abdomen: Soft/ND/NT, positive BS  Musculoskeletal: No Edema  Neuro: moving all extremities spontaneously, confused.  Data Reviewed: Basic Metabolic Panel:  Recent Labs Lab 02/12/15 1216 02/13/15 0410 02/13/15 0922 02/14/15 0351 02/15/15 0345  NA 140 140  --  140 143  K 4.0 3.3*  --  4.3 4.1  CL 105 107  --  109 112*  CO2 23 24  --  23 24  GLUCOSE 164* 218*  --  148* 111*  BUN 40* 40*  --  29* 28*  CREATININE 1.66* 1.45*  --  1.32* 1.22  CALCIUM 8.8* 8.1*  --  8.5* 8.6*  MG  --  2.0 1.9 2.1 2.1   Liver Function Tests:  Recent Labs Lab 02/13/15 0410  AST 28  ALT 33  ALKPHOS 82  BILITOT 0.5  PROT 4.8*  ALBUMIN 2.5*   No results for input(s): LIPASE, AMYLASE in the last 168 hours. No results for input(s): AMMONIA in the last 168 hours. CBC:  Recent Labs Lab 02/12/15 1216 02/13/15 0410 02/14/15 0351 02/15/15 0345    WBC 6.8 10.8* 8.2 7.8  NEUTROABS 5.9  --   --   --   HGB 10.7* 10.0* 10.3* 9.6*  HCT 31.9* 29.4*  29.2* 30.2* 28.5*  MCV 102.9* 102.8* 102.0* 103.3*  PLT 37* 28* 27* 39*   Cardiac Enzymes:   No results for input(s): CKTOTAL, CKMB, CKMBINDEX, TROPONINI in the last 168 hours. BNP (last 3 results) No results for input(s): BNP in the last 8760 hours.  ProBNP (last 3 results) No results for input(s): PROBNP in the last 8760 hours.  CBG: No results for input(s): GLUCAP in the last 168 hours.  Recent Results (from the past 240 hour(s))  MRSA PCR Screening     Status: None   Collection Time: 02/12/15  4:00 PM  Result Value Ref Range Status   MRSA by PCR NEGATIVE NEGATIVE Final    Comment:        The GeneXpert MRSA Assay (FDA approved for NASAL specimens only), is one component of a comprehensive MRSA colonization surveillance program. It is not intended to diagnose MRSA infection nor to guide or monitor treatment for MRSA infections.   Blood culture (routine x 2)     Status: None (Preliminary result)   Collection Time: 02/12/15  4:37 PM  Result Value Ref Range Status   Specimen Description RIGHT ANTECUBITAL  Final   Special Requests BOTTLES DRAWN AEROBIC AND ANAEROBIC 10CC  Final   Culture   Final    NO GROWTH 3 DAYS Performed at [facility]    Report Status PENDING  Incomplete  Blood culture (routine x 2)     Status: None (Preliminary result)   Collection Time: 02/12/15  4:42 PM  Result Value Ref Range Status   Specimen Description BLOOD LEFT HAND  Final   Special Requests BOTTLES DRAWN AEROBIC AND ANAEROBIC 10CC  Final   Culture   Final    NO GROWTH 3 DAYS Performed at [facility]    Report Status PENDING  Incomplete     Studies: No results found.  Scheduled Meds: . acyclovir  400 mg Oral Daily  . amiodarone  400 mg Oral BID  . antiseptic oral rinse  7 mL Mouth Rinse BID  . dexamethasone  4 mg Oral Daily  . donepezil  10 mg Oral QHS  .  feeding supplement (ENSURE ENLIVE)  237 mL Oral BID BM  . LORazepam  0.5 mg Intravenous Once  . memantine  10 mg Oral BID  . multivitamin with minerals  1 tablet Oral Daily  . sodium chloride  3 mL Intravenous Q12H  . tamsulosin  0.4 mg Oral Daily    Continuous Infusions:     Time spent: 32mns  Darryl Meinhardt MD, PhD  [facility] Pager [phone] If 7PM-7AM, please contact night-coverage at [email or web address], password [email or web address]11/22/2016, 2:11 PM  LOS: 3 days

## 2015-02-16 DIAGNOSIS — Z66 Do not resuscitate: Secondary | ICD-10-CM | POA: Insufficient documentation

## 2015-02-16 LAB — BASIC METABOLIC PANEL
ANION GAP: 7 (ref 5–15)
BUN: 30 mg/dL — ABNORMAL HIGH (ref 6–20)
CHLORIDE: 112 mmol/L — AB (ref 101–111)
CO2: 24 mmol/L (ref 22–32)
Calcium: 8.7 mg/dL — ABNORMAL LOW (ref 8.9–10.3)
Creatinine, Ser: 1.33 mg/dL — ABNORMAL HIGH (ref 0.61–1.24)
GFR calc non Af Amer: 46 mL/min — ABNORMAL LOW (ref >60)
GFR, EST AFRICAN AMERICAN: 53 mL/min — AB (ref >60)
Glucose, Bld: 97 mg/dL (ref 65–99)
Potassium: 4 mmol/L (ref 3.5–5.1)
Sodium: 143 mmol/L (ref 135–145)

## 2015-02-16 LAB — CBC
HEMATOCRIT: 27.9 % — AB (ref 39.0–52.0)
HEMOGLOBIN: 9.4 g/dL — AB (ref 13.0–17.0)
MCH: 34.9 pg — ABNORMAL HIGH (ref 26.0–34.0)
MCHC: 33.7 g/dL (ref 30.0–36.0)
MCV: 103.7 fL — ABNORMAL HIGH (ref 78.0–100.0)
Platelets: 43 10*3/uL — ABNORMAL LOW (ref 150–400)
RBC: 2.69 MIL/uL — ABNORMAL LOW (ref 4.22–5.81)
RDW: 16.5 % — ABNORMAL HIGH (ref 11.5–15.5)
WBC: 6.4 10*3/uL (ref 4.0–10.5)

## 2015-02-16 LAB — URINE CULTURE: CULTURE: NO GROWTH

## 2015-02-16 MED ORDER — AMIODARONE HCL 400 MG PO TABS: 400.0000 mg | ORAL_TABLET | Freq: Two times a day (BID) | ORAL | Status: AC

## 2015-02-16 NOTE — Discharge Summary (Signed)
Physician Discharge Summary  Darryl Ryan PRF:[id number] DOB: 02-09-1924 DOA: 02/12/2015  PCP: Mathews Argyle, MD  Admit date: 02/12/2015 Discharge date: 02/16/2015  Time spent: 20 minutes  Recommendations for Outpatient Follow-up:  1. Follow up with PCP in 2-3 weeks   Discharge Diagnoses:  Active Problems:   Atrial fibrillation with RVR (HCC)   A-fib (HCC)   Faintness   Multiple myeloma without remission (HCC)   Hypokalemia   Lactic acidosis   Thrombocytopenia (HCC)   FTT (failure to thrive) in adult   Palliative care encounter   DNR (do not resuscitate)   Discharge Condition: Stable  Diet recommendation: Soft diet with thin liquids  Filed Weights   02/12/15 1600 02/14/15 0500 02/15/15 1817  Weight: 67.3 kg (148 lb 5.9 oz) 72.3 kg (159 lb 6.3 oz) 72.5 kg (159 lb 13.3 oz)    History of present illness:  Please review dictated H and P from 11/19 for details. Briefly, 81 y.o. male h/o mild dementia, htn, Patient was recently diagnosed with multiple myeloma under active treatment, per wife patient has showed progressive decline. Patient was admitted for afib with RVR and syncope.  Hospital Course:  Afib/RVR:  New onset, converted to sinus rhythm initially on amiodarone drip, due to thrombocytopenia and fall risk, not a candidate for anticoagulation.echo lVEF 55%, no wall motion abnormality, grade 1 diastolic dysfunction. Patient was transitioned to oral amio per cardiology, with recs for close f/u per cardiology  Confusion/agitation with baseline dementia: improving,   Suppressed tsh, free t4 wnl.  Syncope with "seizure" like activity: start seizure and aspiration precaution, EEG nonspecific changes, ct head no acute findings. No seizure observed in the hospital.  Lactic acidosis: no overt sign of infection, from poor perfusion due to afib/rvr/hypotension? ua was unremarkable, blood culture no growth, cxr no infiltrate, no fever, no leukocytosis.   Normalized.  CKD III:  Cr 1.66 on admission, 1.22 on 11/22 ua unremarkable on admission, renal dosing meds.   Thrombocytopenia: from multiple myeloma vs chemo. No active bleed. Per Oncology  Multiple myeloma: off revlimid due to pancytopenia, on bortezomib, last does on 11/8. Reported diffuse bone pain, prn pain meds. Continue daily decadron, and acyclovir prophylaxis. - Dr. Alvy Bimler has agreed to palliative care consult - Per Palliative Care, pt's wife would like to have pt home first before considering nursing home/hospice placement   FTT/generalized weakness/malnutrition: nutrition consult, PT consult, wife does not want SNF, want home health at discharge. Pt's wife is open to SNF if she becomes unable to care for her husband  Loose stool: wife reported given him miralax prior to admission, stabile  Hypokalemia: replaceed   Elevated am blood glucose, likely steroid induced, a1c 6.6, carb modified diet for now.  DVT prophylaxis: scd's while admitted  Consultations:  Hematology  Cardiology  Discharge Exam: Filed Vitals:   02/15/15 1817 02/15/15 2024 02/16/15 0525 02/16/15 1402  BP: 132/68 126/78 164/78 118/69  Pulse: 91 92 78 84  Temp: 98.1 F (36.7 C) 97.6 F (36.4 C) 97.5 F (36.4 C) 97.5 F (36.4 C)  TempSrc: Axillary Oral Oral Oral  Resp: 20 22 20 20   Height: 5' 7"  (1.702 m)     Weight: 72.5 kg (159 lb 13.3 oz)     SpO2: 99% 99% 100% 98%    General: Awake, in nad Cardiovascular: regular, s1, s2 Respiratory: normal resp effort, no wheezing  Discharge Instructions     Medication List    STOP taking these medications  hydrALAZINE 10 MG tablet  Commonly known as:  APRESOLINE     lenalidomide 2.5 MG capsule  Commonly known as:  REVLIMID     ondansetron 8 MG tablet  Commonly known as:  ZOFRAN     oxyCODONE 5 MG immediate release tablet  Commonly known as:  Oxy IR/ROXICODONE     prochlorperazine 10 MG tablet  Commonly known as:   COMPAZINE      TAKE these medications        acetaminophen 500 MG tablet  Commonly known as:  TYLENOL  Take 1,000 mg by mouth every 6 (six) hours as needed for moderate pain.     acyclovir 400 MG tablet  Commonly known as:  ZOVIRAX  Take 1 tablet (400 mg total) by mouth daily.     amiodarone 400 MG tablet  Commonly known as:  PACERONE  Take 1 tablet (400 mg total) by mouth 2 (two) times daily.     aspirin 81 MG tablet  Take 81 mg by mouth daily.     dexamethasone 4 MG tablet  Commonly known as:  DECADRON  TAKE 1 TABLET BY MOUTH EVERY DAY     donepezil 10 MG tablet  Commonly known as:  ARICEPT  Take 10 mg by mouth at bedtime.     doxazosin 8 MG tablet  Commonly known as:  CARDURA  Take 8 mg by mouth daily.     feeding supplement Liqd  Take 1 Container by mouth every morning.     memantine 10 MG tablet  Commonly known as:  NAMENDA  Take 10 mg by mouth 2 (two) times daily.     metoprolol tartrate 25 MG tablet  Commonly known as:  LOPRESSOR  Take 25 mg by mouth 2 (two) times daily.     multivitamin with minerals Tabs tablet  Take 1 tablet by mouth daily.     polyethylene glycol packet  Commonly known as:  MIRALAX / GLYCOLAX  Take 17 g by mouth daily as needed.       Allergies  Allergen Reactions  . Bee Venom Anaphylaxis  . Oxycodone     Makes patient crazy   . Tramadol Hives   Follow-up Information    Follow up with [facility].   Specialty:  [place]   Why:  HHRN/HHPT/OT/aide/social worker   Contact information:   [place] [id number] [phone]       Follow up with Mathews Argyle, MD. Schedule an appointment as soon as possible for a visit in 2 weeks.   Specialty:  Internal Medicine   Why:  Hospital follow up   Contact information:   [address] [place] [address] [phone]        The results of significant diagnostics from this hospitalization (including imaging,  microbiology, ancillary and laboratory) are listed below for reference.    Significant Diagnostic Studies: Ct Head Wo Contrast  02/12/2015  CLINICAL DATA:  Patient with multiple syncopal episodes and confusion. Dizziness and lightheadedness. EXAM: CT HEAD WITHOUT CONTRAST TECHNIQUE: Contiguous axial images were obtained from the base of the skull through the vertex without intravenous contrast. COMPARISON:  CT brain 01/05/2015 FINDINGS: Ventricles and sulci are prominent. Periventricular and subcortical white matter hypodensity compatible with chronic small vessel ischemic changes. No evidence for acute cortically based infarct, intracranial hemorrhage, mass lesion or mass-effect. Orbits are unremarkable. Mastoid air cells unremarkable. Mucosal thickening within the bilateral maxillary sinuses. IMPRESSION: No acute intracranial process.  Chronic small vessel ischemic changes. Electronically Signed   By: Lovey Newcomer M.D.   On: 02/12/2015 13:21   Dg Chest Portable 1 View  02/12/2015  CLINICAL DATA:  Syncope and confusion. Dizziness and lightheadedness. EXAM: PORTABLE CHEST 1 VIEW COMPARISON:  01/05/2015 FINDINGS: The heart size and mediastinal contours are within normal limits. Chronic interstitial coarsening noted. No airspace consolidation. The visualized skeletal structures are unremarkable. IMPRESSION: 1. No acute findings. 2. Chronic interstitial coarsening. Electronically Signed   By: Kerby Moors M.D.   On: 02/12/2015 13:03    Microbiology: Recent Results (from the past 240 hour(s))  MRSA PCR Screening     Status: None   Collection Time: 02/12/15  4:00 PM  Result Value Ref Range Status   MRSA by PCR NEGATIVE NEGATIVE Final    Comment:        The GeneXpert MRSA Assay (FDA approved for NASAL specimens only), is one component of a comprehensive MRSA colonization surveillance program. It is not intended to diagnose MRSA infection nor to guide or monitor treatment for MRSA infections.    Blood culture (routine x 2)     Status: None (Preliminary result)   Collection Time: 02/12/15  4:37 PM  Result Value Ref Range Status   Specimen Description RIGHT ANTECUBITAL  Final   Special Requests BOTTLES DRAWN AEROBIC AND ANAEROBIC 10CC  Final   Culture   Final    NO GROWTH 4 DAYS Performed at [facility]    Report Status PENDING  Incomplete  Blood culture (routine x 2)     Status: None (Preliminary result)   Collection Time: 02/12/15  4:42 PM  Result Value Ref Range Status   Specimen Description BLOOD LEFT HAND  Final   Special Requests BOTTLES DRAWN AEROBIC AND ANAEROBIC 10CC  Final   Culture   Final    NO GROWTH 4 DAYS Performed at [facility]    Report Status PENDING  Incomplete  Culture, Urine     Status: None   Collection Time: 02/15/15  6:56 PM  Result Value Ref Range Status   Specimen Description URINE, CLEAN CATCH  Final   Special Requests NONE  Final   Culture   Final    NO GROWTH 1 DAY Performed at [facility]    Report Status 02/16/2015 FINAL  Final     Labs: Basic Metabolic Panel:  Recent Labs Lab 02/12/15 1216 02/13/15 0410 02/13/15 0922 02/14/15 0351 02/15/15 0345 02/16/15 0524  NA 140 140  --  140 143 143  K 4.0 3.3*  --  4.3 4.1 4.0  CL 105 107  --  109 112* 112*  CO2 23 24  --  23 24 24   GLUCOSE 164* 218*  --  148* 111* 97  BUN 40* 40*  --  29* 28* 30*  CREATININE 1.66* 1.45*  --  1.32* 1.22 1.33*  CALCIUM 8.8* 8.1*  --  8.5* 8.6* 8.7*  MG  --  2.0 1.9 2.1 2.1  --    Liver Function Tests:  Recent Labs Lab 02/13/15 0410  AST 28  ALT 33  ALKPHOS 82  BILITOT 0.5  PROT 4.8*  ALBUMIN 2.5*   No results for input(s): LIPASE, AMYLASE in the last 168 hours. No results for input(s): AMMONIA in the last 168 hours. CBC:  Recent Labs Lab 02/12/15 1216 02/13/15 0410 02/14/15 0351 02/15/15 0345 02/16/15 0524  WBC 6.8 10.8* 8.2 7.8 6.4  NEUTROABS 5.9  --   --   --   --  HGB 10.7* 10.0* 10.3* 9.6* 9.4*   HCT 31.9* 29.4*  29.2* 30.2* 28.5* 27.9*  MCV 102.9* 102.8* 102.0* 103.3* 103.7*  PLT 37* 28* 27* 39* 43*   Cardiac Enzymes: No results for input(s): CKTOTAL, CKMB, CKMBINDEX, TROPONINI in the last 168 hours. BNP: BNP (last 3 results) No results for input(s): BNP in the last 8760 hours.  ProBNP (last 3 results) No results for input(s): PROBNP in the last 8760 hours.  CBG: No results for input(s): GLUCAP in the last 168 hours.   Signed:  CHIU, Orpah Melter  [facility] 02/16/2015, 6:29 PM

## 2015-02-16 NOTE — Care Management Note (Signed)
Case Management Note  Patient Details  Name: Darryl Ryan MRN: [id number] Date of Birth: 02/27/1924  Subjective/Objective:Confirmed w/[facility] rep. Patient active w/HHRN/PT/OT/aide/social worker.Per spouse will need ambulance transp home.                    Action/Plan:d/c home w/HHC.   Expected Discharge Date:   (UNKNOWN)               Expected Discharge Plan:  [place]  In-House Referral:     Discharge planning Services  CM Consult  Post Acute Care Choice:  Home Health ([facility]HHRN/PT/OT/aide/social worker) Choice offered to:     DME Arranged:    DME Agency:     HH Arranged:  RN, PT, OT, Nurse's Aide, Social Work [employer] Agency:  [place]  Status of Service:  Completed, signed off  Medicare Important Message Given:  Yes Date Medicare IM Given:    Medicare IM give by:    Date Additional Medicare IM Given:    Additional Medicare Important Message give by:     If discussed at [place] of Stay Meetings, dates discussed:    Additional Comments:  Dessa Phi, RN 02/16/2015, 12:29 PM

## 2015-02-16 NOTE — Care Management Important Message (Signed)
Important Message  Patient Details  Name: Darryl Ryan MRN: [id number] Date of Birth: [birth year]   Medicare Important Message Given:  Yes    Camillo Flaming 02/16/2015, 10:10 AMImportant Message  Patient Details  Name: Darryl Ryan MRN: [id number] Date of Birth: May 04, 1923   Medicare Important Message Given:  Yes    Camillo Flaming 02/16/2015, 10:10 AM

## 2015-02-16 NOTE — Evaluation (Signed)
Physical Therapy Evaluation Patient Details Name: Darryl Ryan MRN: [id number] DOB: 06/07/1923 Today's Date: 02/16/2015   History of Present Illness  81 yo with history of multiple myeloma, pancytopenia from myeloma treatment, CKD stage IV, HTN, Hep B and dementia presented with atrial fibrillation with RVR  Clinical Impression  Pt admitted with above diagnosis. Pt currently with functional limitations due to the deficits listed below (see PT Problem List).  Pt will benefit from skilled PT to increase their independence and safety with mobility to allow discharge to the venue listed below.   Pt requiring mod assist for mobility at this time, however did progress to min assist towards end of session.  Spouse reports she can have family assist at home.  Per previous notes in chart, spouse resistant to SNF placement so recommend HHPT and 24/7 assist at home.  Pt and spouse educated to have pt mobilize at home with assist since he requires assist today and has been falling at home prior to arrival.     Follow Up Recommendations Home health PT;Supervision/Assistance - 24 hour    Equipment Recommendations  None recommended by PT    Recommendations for Other Services       Precautions / Restrictions Precautions Precautions: Fall      Mobility  Bed Mobility Overal bed mobility: Needs Assistance Bed Mobility: Supine to Sit     Supine to sit: Min guard     General bed mobility comments: provided hand for pt to self assist, increased time and effort  Transfers Overall transfer level: Needs assistance Equipment used: Rolling walker (2 wheeled) Transfers: Sit to/from Stand Sit to Stand: Mod assist;From elevated surface         General transfer comment: multimodal cues for safe technique, assist to rise, performed again after break from walking to sitting in recliner and less assist required on second transfer  Ambulation/Gait Ambulation/Gait assistance: Min assist;Min  guard Ambulation Distance (Feet): 160 Feet Assistive device: Rolling walker (2 wheeled) Gait Pattern/deviations: Step-through pattern;Trunk flexed;Decreased stride length     General Gait Details: initial min assist for steadying however improved with distance, verbal and visual cues for safe use of RW and posture  Stairs            Wheelchair Mobility    Modified Rankin (Stroke Patients Only)       Balance Overall balance assessment: History of Falls                                           Pertinent Vitals/Pain Pain Assessment: No/denies pain    Home Living Family/patient expects to be discharged to:: Private residence Living Arrangements: Spouse/significant other   Type of Home: House Home Access: Ramped entrance     Home Layout: One level Home Equipment: Walker - 2 wheels;Wheelchair - manual      Prior Function Level of Independence: Independent with assistive device(s)         Comments: spouse reports he ambulates with RW household distances however falls have increased     Hand Dominance        Extremity/Trunk Assessment   Upper Extremity Assessment: Generalized weakness           Lower Extremity Assessment: Generalized weakness         Communication   Communication: No difficulties  Cognition Arousal/Alertness: Awake/alert Behavior During Therapy: WFL for tasks assessed/performed Overall Cognitive   Status: History of cognitive impairments - at baseline (dementia)                      General Comments      Exercises        Assessment/Plan    PT Assessment Patient needs continued PT services  PT Diagnosis Difficulty walking   PT Problem List Decreased strength;Decreased mobility;Decreased knowledge of use of DME;Decreased activity tolerance;Decreased safety awareness  PT Treatment Interventions DME instruction;Gait training;Functional mobility training;Therapeutic activities;Therapeutic  exercise;Patient/family education;Balance training   PT Goals (Current goals can be found in the Care Plan section) Acute Rehab PT Goals PT Goal Formulation: With patient Time For Goal Achievement: 02/23/15 Potential to Achieve Goals: Good    Frequency Min 3X/week   Barriers to discharge        Co-evaluation               End of Session Equipment Utilized During Treatment: Gait belt Activity Tolerance: Patient limited by fatigue Patient left: with call bell/phone within reach;in chair;with chair alarm set;with family/visitor present           Time: 1137-1153 PT Time Calculation (min) (ACUTE ONLY): 16 min   Charges:   PT Evaluation $Initial PT Evaluation Tier I: 1 Procedure     PT G Codes:        ,KATHrine E 02/16/2015, 1:56 PM Kati , PT, DPT 02/16/2015 Pager: [phone]   

## 2015-02-16 NOTE — Progress Notes (Signed)
Patient discharged to home by [facility]. IVs/tele removed, AVS/scripts given to wife. Patient stable at time of discharge.

## 2015-02-17 LAB — CULTURE, BLOOD (ROUTINE X 2)
CULTURE: NO GROWTH
CULTURE: NO GROWTH

## 2015-02-18 ENCOUNTER — Encounter (HOSPITAL_BASED_OUTPATIENT_CLINIC_OR_DEPARTMENT_OTHER): Payer: Self-pay | Admitting: *Deleted

## 2015-02-18 ENCOUNTER — Emergency Department (HOSPITAL_BASED_OUTPATIENT_CLINIC_OR_DEPARTMENT_OTHER): Payer: Medicare Other

## 2015-02-18 ENCOUNTER — Emergency Department (HOSPITAL_BASED_OUTPATIENT_CLINIC_OR_DEPARTMENT_OTHER)
Admission: EM | Admit: 2015-02-18 | Discharge: 2015-02-18 | Disposition: A | Discharge status: Home / Self Care | Payer: Medicare Other | Attending: Emergency Medicine | Admitting: Emergency Medicine

## 2015-02-18 DIAGNOSIS — Z859 Personal history of malignant neoplasm, unspecified: Secondary | ICD-10-CM | POA: Diagnosis not present

## 2015-02-18 DIAGNOSIS — K5900 Constipation, unspecified: Secondary | ICD-10-CM | POA: Diagnosis not present

## 2015-02-18 DIAGNOSIS — F0390 Unspecified dementia without behavioral disturbance: Secondary | ICD-10-CM | POA: Diagnosis not present

## 2015-02-18 DIAGNOSIS — N189 Chronic kidney disease, unspecified: Secondary | ICD-10-CM | POA: Diagnosis not present

## 2015-02-18 DIAGNOSIS — Z79899 Other long term (current) drug therapy: Secondary | ICD-10-CM | POA: Diagnosis not present

## 2015-02-18 DIAGNOSIS — I129 Hypertensive chronic kidney disease with stage 1 through stage 4 chronic kidney disease, or unspecified chronic kidney disease: Secondary | ICD-10-CM | POA: Diagnosis not present

## 2015-02-18 DIAGNOSIS — Z8619 Personal history of other infectious and parasitic diseases: Secondary | ICD-10-CM | POA: Insufficient documentation

## 2015-02-18 DIAGNOSIS — Z7982 Long term (current) use of aspirin: Secondary | ICD-10-CM | POA: Insufficient documentation

## 2015-02-18 LAB — CBC WITH DIFFERENTIAL/PLATELET
BASOS ABS: 0 10*3/uL (ref 0.0–0.1)
Basophils Relative: 0 %
EOS PCT: 1 %
Eosinophils Absolute: 0.1 10*3/uL (ref 0.0–0.7)
HEMATOCRIT: 28.9 % — AB (ref 39.0–52.0)
Hemoglobin: 9.6 g/dL — ABNORMAL LOW (ref 13.0–17.0)
LYMPHS ABS: 0.5 10*3/uL — AB (ref 0.7–4.0)
LYMPHS PCT: 7 %
MCH: 34.3 pg — AB (ref 26.0–34.0)
MCHC: 33.2 g/dL (ref 30.0–36.0)
MCV: 103.2 fL — AB (ref 78.0–100.0)
MONO ABS: 0.6 10*3/uL (ref 0.1–1.0)
MONOS PCT: 9 %
Neutro Abs: 5.6 10*3/uL (ref 1.7–7.7)
Neutrophils Relative %: 83 %
PLATELETS: 74 10*3/uL — AB (ref 150–400)
RBC: 2.8 MIL/uL — ABNORMAL LOW (ref 4.22–5.81)
RDW: 15.3 % (ref 11.5–15.5)
WBC: 6.7 10*3/uL (ref 4.0–10.5)

## 2015-02-18 LAB — LIPASE, BLOOD: LIPASE: 24 U/L (ref 11–51)

## 2015-02-18 LAB — COMPREHENSIVE METABOLIC PANEL
ALT: 30 U/L (ref 17–63)
ANION GAP: 6 (ref 5–15)
AST: 25 U/L (ref 15–41)
Albumin: 2.7 g/dL — ABNORMAL LOW (ref 3.5–5.0)
Alkaline Phosphatase: 80 U/L (ref 38–126)
BUN: 39 mg/dL — ABNORMAL HIGH (ref 6–20)
CHLORIDE: 108 mmol/L (ref 101–111)
CO2: 25 mmol/L (ref 22–32)
Calcium: 8.4 mg/dL — ABNORMAL LOW (ref 8.9–10.3)
Creatinine, Ser: 1.58 mg/dL — ABNORMAL HIGH (ref 0.61–1.24)
GFR, EST AFRICAN AMERICAN: 43 mL/min — AB (ref >60)
GFR, EST NON AFRICAN AMERICAN: 37 mL/min — AB (ref >60)
Glucose, Bld: 119 mg/dL — ABNORMAL HIGH (ref 65–99)
POTASSIUM: 4.3 mmol/L (ref 3.5–5.1)
SODIUM: 139 mmol/L (ref 135–145)
Total Bilirubin: 0.7 mg/dL (ref 0.3–1.2)
Total Protein: 4.9 g/dL — ABNORMAL LOW (ref 6.5–8.1)

## 2015-02-18 MED ORDER — MILK AND MOLASSES ENEMA
1.0000 | Freq: Once | RECTAL | Status: DC
  Filled 2015-02-18: qty 250

## 2015-02-18 MED ORDER — FLEET ENEMA 7-19 GM/118ML RE ENEM
1.0000 | ENEMA | Freq: Once | RECTAL | Status: AC
  Administered 2015-02-18: 1 via RECTAL
  Filled 2015-02-18: qty 1

## 2015-02-18 MED ORDER — IOHEXOL 300 MG/ML  SOLN
90.0000 mL | Freq: Once | INTRAMUSCULAR | Status: AC | PRN
  Administered 2015-02-18: 90 mL via INTRAVENOUS

## 2015-02-18 MED ORDER — SODIUM CHLORIDE 0.9 % IV BOLUS (SEPSIS)
500.0000 mL | Freq: Once | INTRAVENOUS | Status: AC
  Administered 2015-02-18: 500 mL via INTRAVENOUS

## 2015-02-18 MED ORDER — IOHEXOL 300 MG/ML  SOLN
25.0000 mL | Freq: Once | INTRAMUSCULAR | Status: AC | PRN
  Administered 2015-02-18: 25 mL via ORAL

## 2015-02-18 NOTE — ED Provider Notes (Signed)
CSN: [id number]     Arrival date & time 02/18/15  4034 History   First MD Initiated Contact with Patient 02/18/15 1021     No chief complaint on file.    (Consider location/radiation/quality/duration/timing/severity/associated sxs/prior Treatment) Patient is a 81 y.o. male presenting with general illness. The history is provided by the patient.  Illness Severity:  Mild Onset quality:  Sudden Duration:  1 week Timing:  Constant Progression:  Unchanged Chronicity:  New Associated symptoms: no abdominal pain, no chest pain, no congestion, no diarrhea, no fever, no headaches, no myalgias, no rash, no shortness of breath and no vomiting    81 yo M with a chief complaint of constipation. This been going on for about a week. Patient was just in the ICU for a week and a half for A. fib with RVR and failure to thrive. Family is concerned because he is not. Since he was in the hospital. Unsure if he had flatus. Denies abdominal pain vomiting nausea. Denies prior abdominal surgery.  Past Medical History  Diagnosis Date  . Hypertension   . Dementia   . Hepatitis B   . Dysphasia   . Cancer ([place])   . Seizure ([place]) 02/11/2015  . Chronic kidney disease    History reviewed. No pertinent past surgical history. Family History  Problem Relation Age of Onset  . Family history unknown: Yes   Social History  Substance Use Topics  . Smoking status: Never Smoker   . Smokeless tobacco: Never Used  . Alcohol Use: No    Review of Systems  Constitutional: Negative for fever and chills.  HENT: Negative for congestion and facial swelling.   Eyes: Negative for discharge and visual disturbance.  Respiratory: Negative for shortness of breath.   Cardiovascular: Negative for chest pain and palpitations.  Gastrointestinal: Positive for constipation and abdominal distention. Negative for vomiting, abdominal pain and diarrhea.  Musculoskeletal: Negative for myalgias and arthralgias.  Skin: Negative for  color change and rash.  Neurological: Negative for tremors, syncope and headaches.  Psychiatric/Behavioral: Negative for confusion and dysphoric mood.      Allergies  Bee venom; Oxycodone; and Tramadol  Home Medications   Prior to Admission medications   Medication Sig Start Date End Date Taking? Authorizing Provider  acetaminophen (TYLENOL) 500 MG tablet Take 1,000 mg by mouth every 6 (six) hours as needed for moderate pain.   Yes Historical Provider, MD  acyclovir (ZOVIRAX) 400 MG tablet Take 1 tablet (400 mg total) by mouth daily. 12/16/14  Yes Heath Lark, MD  amiodarone (PACERONE) 400 MG tablet Take 1 tablet (400 mg total) by mouth 2 (two) times daily. 02/16/15  Yes Donne Hazel, MD  aspirin 81 MG tablet Take 81 mg by mouth daily.   Yes Historical Provider, MD  donepezil (ARICEPT) 10 MG tablet Take 10 mg by mouth at bedtime.   Yes Historical Provider, MD  doxazosin (CARDURA) 8 MG tablet Take 8 mg by mouth daily. 12/21/14  Yes Historical Provider, MD  feeding supplement (BOOST HIGH PROTEIN) LIQD Take 1 Container by mouth every morning.   Yes Historical Provider, MD  memantine (NAMENDA) 10 MG tablet Take 10 mg by mouth 2 (two) times daily.   Yes Historical Provider, MD  metoprolol tartrate (LOPRESSOR) 25 MG tablet Take 25 mg by mouth 2 (two) times daily.   Yes Historical Provider, MD  Multiple Vitamin (MULTIVITAMIN WITH MINERALS) TABS tablet Take 1 tablet by mouth daily.   Yes Historical Provider, MD  polyethylene glycol (  MIRALAX / GLYCOLAX) packet Take 17 g by mouth daily as needed.   Yes Historical Provider, MD   BP 117/74 mmHg  Pulse 75  Temp(Src) 98 F (36.7 C) (Oral)  Resp 18  Ht 5' 7"  (1.702 m)  Wt 150 lb (68.04 kg)  BMI 23.49 kg/m2  SpO2 96% Physical Exam  Constitutional: He is oriented to person, place, and time. He appears well-developed and well-nourished.  HENT:  Head: Normocephalic and atraumatic.  Eyes: EOM are normal. Pupils are equal, round, and reactive to  light.  Neck: Normal range of motion. Neck supple. No JVD present.  Cardiovascular: Normal rate and regular rhythm.  Exam reveals no gallop and no friction rub.   No murmur heard. Pulmonary/Chest: No respiratory distress. He has no wheezes.  Abdominal: He exhibits distension. There is no rebound and no guarding.  Musculoskeletal: Normal range of motion.  Neurological: He is alert and oriented to person, place, and time.  Skin: No rash noted. No pallor.  Psychiatric: He has a normal mood and affect. His behavior is normal.  Nursing note and vitals reviewed.   ED Course  Procedures (including critical care time) Labs Review Labs Reviewed  CBC WITH DIFFERENTIAL/PLATELET - Abnormal; Notable for the following:    RBC 2.80 (*)    Hemoglobin 9.6 (*)    HCT 28.9 (*)    MCV 103.2 (*)    MCH 34.3 (*)    Platelets 74 (*)    Lymphs Abs 0.5 (*)    All other components within normal limits  COMPREHENSIVE METABOLIC PANEL - Abnormal; Notable for the following:    Glucose, Bld 119 (*)    BUN 39 (*)    Creatinine, Ser 1.58 (*)    Calcium 8.4 (*)    Total Protein 4.9 (*)    Albumin 2.7 (*)    GFR calc non Af Amer 37 (*)    GFR calc Af Amer 43 (*)    All other components within normal limits  LIPASE, BLOOD    Imaging Review Ct Abdomen Pelvis W Contrast  02/18/2015  CLINICAL DATA:  Abdominal bloating and fullness for a little over 1 week. The patient reports she has not had a bowel movement in approximately 1 week. EXAM: CT ABDOMEN AND PELVIS WITH CONTRAST TECHNIQUE: Multidetector CT imaging of the abdomen and pelvis was performed using the standard protocol following bolus administration of intravenous contrast. CONTRAST:  25 mL OMNIPAQUE IOHEXOL 300 MG/ML SOLN, 90 mL OMNIPAQUE IOHEXOL 300 MG/ML SOLN COMPARISON:  CT abdomen and pelvis 10/26/2014. MRI lumbar spine 12/13/2014. FINDINGS: There are small to moderate bilateral pleural effusions. Trace amount pericardial fluid is noted. Cardiomegaly  is identified. Compressive atelectasis is seen in the lung bases. Bilateral renal cysts are unchanged. Multiple bilateral nonobstructing renal stones are also identified. There is no hydronephrosis or ureteral stone. Calcifications in the spleen consistent with old granulomatous disease are again seen. Subtly increased attenuation dependently within the gallbladder may be due to sludge or small stones. There is no evidence of cholecystitis. The pancreas, adrenal glands and biliary tree are unremarkable. Trace amount of perihepatic and free pelvic fluid is seen. There is also a small amount of fluid in bilateral fat containing inguinal hernias. Moderate stool burden throughout the colon is identified. The colon is otherwise unremarkable. The appendix is not visualized but no periappendiceal inflammatory process is seen. There is no lymphadenopathy. Aortoiliac atherosclerosis without aneurysm is noted. Bones demonstrate mild compression fracture deformities of T11, L1, L2 and L4.  Vertebral body height loss appears greatest at L1 were is estimated at 25%. The L2 compression fracture appears new since the comparison MRI. The other fractures are present on the examination. Scattered small lucencies and bone are consistent with the patient's history of multiple myeloma. IMPRESSION: Small volume of free abdominal pelvic fluid is nonspecific but may be secondary to enteritis or possibly IV fluids. Moderate stool burden throughout the colon. Unchanged nonobstructing bilateral renal stones. T11, L1 and L4 compression fractures are seen on the comparison lumbar spine MRI. The patient's L2 compression fracture appears new since the that examination. Possible tiny gallstones versus gallbladder sludge without evidence of cholecystitis. Small to moderate bilateral pleural effusions with associated compressive basilar atelectasis. Cardiomegaly. Electronically Signed   By: Inge Rise M.D.   On: 02/18/2015 11:45   I have  personally reviewed and evaluated these images and lab results as part of my medical decision-making.   EKG Interpretation None      MDM   Final diagnoses:  Constipation, unspecified constipation type    81 yo M with a chief complaint of constipation. Rectal exam with hard stool just beyond my finger. Abdomen distended decreased bowel sounds. Concern for possible small bowel obstruction CT scan with contrast ordered. CT scan negative for obstruction.  Concern for free fluid in the pelvis, patient with recent ICU stay feel likely IV fluid administration vs enteritis without n/v/d. Discussed results with family, suggested this be treated at home. Family is concerned because he is 66 and his wife is 79. Feel like they're unable to do enemas at home. Patient was given a fleets enema with very minimal output. Family now requesting discharge home. We'll have them do 8 scoops of MiraLAX in 32 ounces of water. PCP follow-up.  1:57 PM:  I have discussed the diagnosis/risks/treatment options with the patient and family and believe the pt to be eligible for discharge home to follow-up with PCP. We also discussed returning to the ED immediately if new or worsening sx occur. We discussed the sx which are most concerning (e.g., sudden worsening pain, fever, inability to tolerate by mouth) that necessitate immediate return. Medications administered to the patient during their visit and any new prescriptions provided to the patient are listed below.  Medications given during this visit Medications  milk and molasses enema (250 mLs Rectal Not Given 02/18/15 1312)  sodium chloride 0.9 % bolus 500 mL (0 mLs Intravenous Stopped 02/18/15 1148)  iohexol (OMNIPAQUE) 300 MG/ML solution 25 mL (25 mLs Oral Contrast Given 02/18/15 1117)  iohexol (OMNIPAQUE) 300 MG/ML solution 90 mL (90 mLs Intravenous Contrast Given 02/18/15 1117)  sodium phosphate (FLEET) 7-19 GM/118ML enema 1 enema (1 enema Rectal Given 02/18/15 1207)     New Prescriptions   No medications on file    The patient appears reasonably screen and/or stabilized for discharge and I doubt any other medical condition or other [facility] requiring further screening, evaluation, or treatment in the ED at this time prior to discharge.      Deno Etienne, DO 02/18/15 1358

## 2015-02-18 NOTE — ED Notes (Signed)
C/o no BM since Saturday. Was discharged from [facility] ICU on Wednesday for a-fib. Pt c/o feeling bloated and full.  No c/o pain.

## 2015-02-18 NOTE — ED Notes (Signed)
Clear fluid on pad from fleets enema and small amt of brown liquid in bed pan.

## 2015-02-18 NOTE — Discharge Instructions (Signed)
Take 8 scoops of MiraLAX in 32 ounces of a drink of your choice. Stay near a bathroom.  Constipation, Adult Constipation is when a person has fewer than three bowel movements a week, has difficulty having a bowel movement, or has stools that are dry, hard, or larger than normal. As people grow older, constipation is more common. A low-fiber diet, not taking in enough fluids, and taking certain medicines may make constipation worse.  CAUSES   Certain medicines, such as antidepressants, pain medicine, iron supplements, antacids, and water pills.   Certain diseases, such as diabetes, irritable bowel syndrome (IBS), thyroid disease, or depression.   Not drinking enough water.   Not eating enough fiber-rich foods.   Stress or travel.   Lack of physical activity or exercise.   Ignoring the urge to have a bowel movement.   Using laxatives too much.  SIGNS AND SYMPTOMS   Having fewer than three bowel movements a week.   Straining to have a bowel movement.   Having stools that are hard, dry, or larger than normal.   Feeling full or bloated.   Pain in the lower abdomen.   Not feeling relief after having a bowel movement.  DIAGNOSIS  Your health care provider will take a medical history and perform a physical exam. Further testing may be done for severe constipation. Some tests may include:  A barium enema X-ray to examine your rectum, colon, and, sometimes, your small intestine.   A sigmoidoscopy to examine your lower colon.   A colonoscopy to examine your entire colon. TREATMENT  Treatment will depend on the severity of your constipation and what is causing it. Some dietary treatments include drinking more fluids and eating more fiber-rich foods. Lifestyle treatments may include regular exercise. If these diet and lifestyle recommendations do not help, your health care provider may recommend taking over-the-counter laxative medicines to help you have bowel movements.  Prescription medicines may be prescribed if over-the-counter medicines do not work.  HOME CARE INSTRUCTIONS   Eat foods that have a lot of fiber, such as fruits, vegetables, whole grains, and beans.  Limit foods high in fat and processed sugars, such as french fries, hamburgers, cookies, candies, and soda.   A fiber supplement may be added to your diet if you cannot get enough fiber from foods.   Drink enough fluids to keep your urine clear or pale yellow.   Exercise regularly or as directed by your health care provider.   Go to the restroom when you have the urge to go. Do not hold it.   Only take over-the-counter or prescription medicines as directed by your health care provider. Do not take other medicines for constipation without talking to your health care provider first.  [place] IF:   You have bright red blood in your stool.   Your constipation lasts for more than 4 days or gets worse.   You have abdominal or rectal pain.   You have thin, pencil-like stools.   You have unexplained weight loss. MAKE SURE YOU:   Understand these instructions.  Will watch your condition.  Will get help right away if you are not doing well or get worse.   This information is not intended to replace advice given to you by your health care provider. Make sure you discuss any questions you have with your health care provider.   Document Released: 12/09/2003 Document Revised: 04/02/2014 Document Reviewed: 12/22/2012 Elsevier Interactive Patient Education [employer].

## 2015-02-18 NOTE — ED Notes (Signed)
Pt family member at desk wanting to know what they are waiting on. Instructed family we were waiting on the MD to see pt, This is what I had told them earlier. Family member asked to go back to room for other pt confidentiallity. States he will wait in hallway.

## 2015-02-28 ENCOUNTER — Telehealth: Payer: Self-pay | Admitting: *Deleted

## 2015-02-28 NOTE — Telephone Encounter (Signed)
Wife states pt is "very weak" from the waist down.  He is "wobbly" and she needs help to transport him.  Says he is not any worse, but not a lot better either.  She intends to keep appt w/ Dr. Alvy Bimler tomorrow.  She has help arranged to help bring him here tomorrow.  Wife wants to discuss his treatment and asks if it might be an option to decrease his Velcade dose or to just take Revlimid alone?  Instructed wife she can discuss pt's condition and ongoing treatment options w/ Dr. Alvy Bimler tomorrow.  She agreed.

## 2015-02-28 NOTE — Telephone Encounter (Signed)
-----   Message from Heath Lark, MD sent at 02/28/2015  9:26 AM EST ----- Regarding: how is he doing? Is he better? If not, we might have to reschedule his Rx/appt

## 2015-03-01 ENCOUNTER — Encounter: Payer: Medicare Other | Admitting: Nutrition

## 2015-03-01 ENCOUNTER — Ambulatory Visit (HOSPITAL_BASED_OUTPATIENT_CLINIC_OR_DEPARTMENT_OTHER): Payer: Medicare Other | Admitting: Hematology and Oncology

## 2015-03-01 ENCOUNTER — Ambulatory Visit: Payer: Medicare Other

## 2015-03-01 ENCOUNTER — Other Ambulatory Visit (HOSPITAL_BASED_OUTPATIENT_CLINIC_OR_DEPARTMENT_OTHER): Payer: Medicare Other

## 2015-03-01 ENCOUNTER — Telehealth: Payer: Self-pay | Admitting: Hematology and Oncology

## 2015-03-01 VITALS — BP 144/83 | HR 51 | Temp 98.6°F | Resp 18 | Ht 67.0 in | Wt 165.4 lb

## 2015-03-01 DIAGNOSIS — C9000 Multiple myeloma not having achieved remission: Secondary | ICD-10-CM | POA: Diagnosis present

## 2015-03-01 DIAGNOSIS — N179 Acute kidney failure, unspecified: Secondary | ICD-10-CM

## 2015-03-01 DIAGNOSIS — K5900 Constipation, unspecified: Secondary | ICD-10-CM

## 2015-03-01 DIAGNOSIS — K5909 Other constipation: Secondary | ICD-10-CM | POA: Insufficient documentation

## 2015-03-01 DIAGNOSIS — I4891 Unspecified atrial fibrillation: Secondary | ICD-10-CM | POA: Diagnosis not present

## 2015-03-01 DIAGNOSIS — D61818 Other pancytopenia: Secondary | ICD-10-CM

## 2015-03-01 DIAGNOSIS — N184 Chronic kidney disease, stage 4 (severe): Secondary | ICD-10-CM

## 2015-03-01 LAB — COMPREHENSIVE METABOLIC PANEL
ALBUMIN: 2.6 g/dL — AB (ref 3.5–5.0)
ALK PHOS: 148 U/L (ref 40–150)
ALT: 49 U/L (ref 0–55)
AST: 25 U/L (ref 5–34)
Anion Gap: 10 mEq/L (ref 3–11)
BILIRUBIN TOTAL: 0.5 mg/dL (ref 0.20–1.20)
BUN: 21.3 mg/dL (ref 7.0–26.0)
CALCIUM: 8.7 mg/dL (ref 8.4–10.4)
CO2: 28 mEq/L (ref 22–29)
Chloride: 102 mEq/L (ref 98–109)
Creatinine: 1.4 mg/dL — ABNORMAL HIGH (ref 0.7–1.3)
EGFR: 52 mL/min/{1.73_m2} — ABNORMAL LOW (ref >90)
GLUCOSE: 170 mg/dL — AB (ref 70–140)
POTASSIUM: 4.2 meq/L (ref 3.5–5.1)
SODIUM: 140 meq/L (ref 136–145)
TOTAL PROTEIN: 5.5 g/dL — AB (ref 6.4–8.3)

## 2015-03-01 LAB — CBC WITH DIFFERENTIAL/PLATELET
BASO%: 0.3 % (ref 0.0–2.0)
BASOS ABS: 0 10*3/uL (ref 0.0–0.1)
EOS ABS: 0 10*3/uL (ref 0.0–0.5)
EOS%: 0.1 % (ref 0.0–7.0)
HEMATOCRIT: 30.8 % — AB (ref 38.4–49.9)
HEMOGLOBIN: 10.2 g/dL — AB (ref 13.0–17.1)
LYMPH#: 0.4 10*3/uL — AB (ref 0.9–3.3)
LYMPH%: 5.1 % — ABNORMAL LOW (ref 14.0–49.0)
MCH: 33.8 pg — AB (ref 27.2–33.4)
MCHC: 33 g/dL (ref 32.0–36.0)
MCV: 102.3 fL — AB (ref 79.3–98.0)
MONO#: 0.4 10*3/uL (ref 0.1–0.9)
MONO%: 5.2 % (ref 0.0–14.0)
NEUT%: 89.3 % — ABNORMAL HIGH (ref 39.0–75.0)
NEUTROS ABS: 6.3 10*3/uL (ref 1.5–6.5)
Platelets: 118 10*3/uL — ABNORMAL LOW (ref 140–400)
RBC: 3.01 10*6/uL — ABNORMAL LOW (ref 4.20–5.82)
RDW: 15.1 % — AB (ref 11.0–14.6)
WBC: 7.1 10*3/uL (ref 4.0–10.3)

## 2015-03-01 NOTE — Telephone Encounter (Signed)
per pof to sch pt appt-gave pt copy of avs °

## 2015-03-01 NOTE — Assessment & Plan Note (Signed)
Despite significant improvement of his myeloma control as documented by his blood tests from October 2016, he continues to have significant failure to thrive with multiple other medical issues. I do not believe they are related to his treatment. I recommend we stop treatment and to focus on supportive care only. I will not proceed with treatment today. I will start to initiate dexamethasone taper over the next 3 weeks. I plan to give him a treatment break and bring him back next month for repeat blood work and further assessment

## 2015-03-01 NOTE — Assessment & Plan Note (Signed)
This is likely due to recent treatment. The patient denies recent history of bleeding such as epistaxis, hematuria or hematochezia. He is asymptomatic from the low platelet count. I will observe for now.  As mentioned above, I will stop his chemotherapy and focus on supportive care.

## 2015-03-01 NOTE — Assessment & Plan Note (Addendum)
The patient have chronic constipation. I recommend scheduled Miralax and Senokot, with goal of 1 bowel movement every other day if possible.

## 2015-03-01 NOTE — Progress Notes (Signed)
[place] OFFICE PROGRESS NOTE  Patient Care Team: Lajean Manes, MD as PCP - General (Internal Medicine) Heath Lark, MD as Consulting Physician (Hematology and Oncology)  SUMMARY OF ONCOLOGIC HISTORY:   Multiple myeloma not having achieved remission ([place])   12/07/2014 - 12/14/2014 Hospital Admission The patient was hospitalized for hypertensive urgency and renal failure. He was found to have multiple myeloma   12/08/2014 Imaging Skeletal survey showed old compression fracture without new bone lytic lesion   12/10/2014 Bone Marrow Biopsy [id number] bone marrow biopsy showed 69% plasma cell involvement   12/21/2014 - 02/11/2015 Chemotherapy He was started on dexamethasone and Velcade   01/05/2015 - 01/07/2015 Hospital Admission The patient was hospitalized for altered mental status.   01/25/2015 Adverse Reaction Due to pancytopenia, Revlimid is placed on hold   02/12/2015 - 02/16/2015 Hospital Admission He was admitted to the hospital for failure to thrive    INTERVAL HISTORY: Please see below for problem oriented charting. He is seen today for further follow-up. His wife is struggling to take care of the patient. She has home health aide and has hired additional help to take care of him. He is dependent on caregivers for all activities of daily living. He had severe, irregular bowel movement. He has severe constipation followed by significant diarrhea followed by now constipation again. His oral intake is erratic. He has been sleeping a lot recently. Since last hospitalization, there were no reported signs and symptoms of infection such as fevers, chills or cough. He has no further confusion episode.  REVIEW OF SYSTEMS:   Constitutional: Denies fevers, chills or abnormal weight loss Eyes: Denies blurriness of vision Ears, nose, mouth, throat, and face: Denies mucositis or sore throat Respiratory: Denies cough, dyspnea or wheezes Cardiovascular: Denies palpitation, chest  discomfort or lower extremity swelling Skin: Denies abnormal skin rashes Lymphatics: Denies new lymphadenopathy or easy bruising Neurological:Denies numbness, tingling or new weaknesses Behavioral/Psych: Mood is stable, no new changes  All other systems were reviewed with the patient and are negative.  I have reviewed the past medical history, past surgical history, social history and family history with the patient and they are unchanged from previous note.  ALLERGIES:  is allergic to bee venom; oxycodone; and tramadol.  MEDICATIONS:  Current Outpatient Prescriptions  Medication Sig Dispense Refill  . acetaminophen (TYLENOL) 500 MG tablet Take 1,000 mg by mouth every 6 (six) hours as needed for moderate pain.    Marland Kitchen amiodarone (PACERONE) 400 MG tablet Take 1 tablet (400 mg total) by mouth 2 (two) times daily. 60 tablet 0  . aspirin 81 MG tablet Take 81 mg by mouth daily.    Marland Kitchen dexamethasone (DECADRON) 4 MG tablet Take 4 mg by mouth daily.    Marland Kitchen donepezil (ARICEPT) 10 MG tablet Take 10 mg by mouth at bedtime.    Marland Kitchen doxazosin (CARDURA) 8 MG tablet Take 8 mg by mouth daily.  3  . feeding supplement (BOOST HIGH PROTEIN) LIQD Take 1 Container by mouth every morning.    . hydrALAZINE (APRESOLINE) 10 MG tablet Take 10 mg by mouth 2 (two) times daily.    . memantine (NAMENDA) 10 MG tablet Take 10 mg by mouth 2 (two) times daily.    . metoprolol tartrate (LOPRESSOR) 25 MG tablet Take 25 mg by mouth 2 (two) times daily.    . Multiple Vitamin (MULTIVITAMIN WITH MINERALS) TABS tablet Take 1 tablet by mouth daily.    . polyethylene glycol (MIRALAX / GLYCOLAX) packet Take 17  g by mouth daily as needed.     No current facility-administered medications for this visit.    PHYSICAL EXAMINATION: ECOG PERFORMANCE STATUS: 3 - Symptomatic, >50% confined to bed  Filed Vitals:   03/01/15 1228  BP: 144/83  Pulse: 51  Temp: 98.6 F (37 C)  Resp: 18   Filed Weights   03/01/15 1228  Weight: 165 lb 6.4 oz  (75.025 kg)    GENERAL:alert, no distress and comfortable. He looks somewhat sedated SKIN: skin color, texture, turgor are normal, no rashes or significant lesions EYES: normal, Conjunctiva are pink and non-injected, sclera clear OROPHARYNX:no exudate, no erythema and lips, buccal mucosa, and tongue normal  Musculoskeletal:no cyanosis of digits and no clubbing  NEURO: alert & oriented x 3 with fluent speech, no focal motor/sensory deficits  LABORATORY DATA:  I have reviewed the data as listed    Component Value Date/Time   NA 140 03/01/2015 1214   NA 139 02/18/2015 1050   K 4.2 03/01/2015 1214   K 4.3 02/18/2015 1050   CL 108 02/18/2015 1050   CO2 28 03/01/2015 1214   CO2 25 02/18/2015 1050   GLUCOSE 170* 03/01/2015 1214   GLUCOSE 119* 02/18/2015 1050   BUN 21.3 03/01/2015 1214   BUN 39* 02/18/2015 1050   CREATININE 1.4* 03/01/2015 1214   CREATININE 1.58* 02/18/2015 1050   CALCIUM 8.7 03/01/2015 1214   CALCIUM 8.4* 02/18/2015 1050   PROT 5.5* 03/01/2015 1214   PROT 4.9* 02/18/2015 1050   ALBUMIN 2.6* 03/01/2015 1214   ALBUMIN 2.7* 02/18/2015 1050   AST 25 03/01/2015 1214   AST 25 02/18/2015 1050   ALT 49 03/01/2015 1214   ALT 30 02/18/2015 1050   ALKPHOS 148 03/01/2015 1214   ALKPHOS 80 02/18/2015 1050   BILITOT 0.50 03/01/2015 1214   BILITOT 0.7 02/18/2015 1050   GFRNONAA 37* 02/18/2015 1050   GFRAA 43* 02/18/2015 1050    No results found for: SPEP, UPEP  Lab Results  Component Value Date   WBC 7.1 03/01/2015   NEUTROABS 6.3 03/01/2015   HGB 10.2* 03/01/2015   HCT 30.8* 03/01/2015   MCV 102.3* 03/01/2015   PLT 118* 03/01/2015      Chemistry      Component Value Date/Time   NA 140 03/01/2015 1214   NA 139 02/18/2015 1050   K 4.2 03/01/2015 1214   K 4.3 02/18/2015 1050   CL 108 02/18/2015 1050   CO2 28 03/01/2015 1214   CO2 25 02/18/2015 1050   BUN 21.3 03/01/2015 1214   BUN 39* 02/18/2015 1050   CREATININE 1.4* 03/01/2015 1214   CREATININE 1.58*  02/18/2015 1050      Component Value Date/Time   CALCIUM 8.7 03/01/2015 1214   CALCIUM 8.4* 02/18/2015 1050   ALKPHOS 148 03/01/2015 1214   ALKPHOS 80 02/18/2015 1050   AST 25 03/01/2015 1214   AST 25 02/18/2015 1050   ALT 49 03/01/2015 1214   ALT 30 02/18/2015 1050   BILITOT 0.50 03/01/2015 1214   BILITOT 0.7 02/18/2015 1050      ASSESSMENT & PLAN:  Multiple myeloma not having achieved remission ([place]) Despite significant improvement of his myeloma control as documented by his blood tests from October 2016, he continues to have significant failure to thrive with multiple other medical issues. I do not believe they are related to his treatment. I recommend we stop treatment and to focus on supportive care only. I will not proceed with treatment today. I will start  to initiate dexamethasone taper over the next 3 weeks. I plan to give him a treatment break and bring him back next month for repeat blood work and further assessment  Chronic kidney disease, stage IV (severe) ([place]) He has intermittent acute on chronic renal failure. His renal function tests today is satisfactory. We will continue close monitoring and risk factor modification  Other pancytopenia ([place]) This is likely due to recent treatment. The patient denies recent history of bleeding such as epistaxis, hematuria or hematochezia. He is asymptomatic from the low platelet count. I will observe for now.  As mentioned above, I will stop his chemotherapy and focus on supportive care.  A-fib [facility]) The patient is currently on high-dose amiodarone for atrial fibrillation. Due to risk of fall, he is only on aspirin and not on anticoagulation therapy His blood pressure is good and his heart rate is regulated on amiodarone. He does not see a cardiologist. I will defer to primary care physician for medical management, hopefully we can wean him off amiodarone in the future.  Constipation, chronic The patient have chronic  constipation. I recommend scheduled Miralax and Senokot, with goal of 1 bowel movement every other day if possible.   Orders Placed This Encounter  Procedures  . SPEP & IFE with QIG    Standing Status: Future     Number of Occurrences:      Standing Expiration Date: 04/04/2016  . Kappa/lambda light chains    Standing Status: Future     Number of Occurrences:      Standing Expiration Date: 04/04/2016   All questions were answered. The patient knows to call the clinic with any problems, questions or concerns. No barriers to learning was detected. I spent 25 minutes counseling the patient face to face. The total time spent in the appointment was 40 minutes and more than 50% was on counseling and review of test results     [facility], [place], MD 03/01/2015 2:04 PM

## 2015-03-01 NOTE — Assessment & Plan Note (Signed)
The patient is currently on high-dose amiodarone for atrial fibrillation. Due to risk of fall, he is only on aspirin and not on anticoagulation therapy His blood pressure is good and his heart rate is regulated on amiodarone. He does not see a cardiologist. I will defer to primary care physician for medical management, hopefully we can wean him off amiodarone in the future.

## 2015-03-01 NOTE — Assessment & Plan Note (Signed)
He has intermittent acute on chronic renal failure. His renal function tests today is satisfactory. We will continue close monitoring and risk factor modification

## 2015-03-04 ENCOUNTER — Ambulatory Visit: Payer: Medicare Other

## 2015-03-08 ENCOUNTER — Ambulatory Visit: Payer: Medicare Other

## 2015-03-08 ENCOUNTER — Other Ambulatory Visit: Payer: Medicare Other

## 2015-03-11 ENCOUNTER — Ambulatory Visit: Payer: Medicare Other

## 2015-03-14 ENCOUNTER — Institutional Professional Consult (permissible substitution): Payer: Medicare Other | Admitting: Internal Medicine

## 2015-03-14 ENCOUNTER — Other Ambulatory Visit: Payer: Self-pay

## 2015-04-18 ENCOUNTER — Other Ambulatory Visit: Payer: Self-pay | Admitting: Hematology and Oncology

## 2015-04-18 DIAGNOSIS — C9000 Multiple myeloma not having achieved remission: Secondary | ICD-10-CM

## 2015-04-19 ENCOUNTER — Other Ambulatory Visit (HOSPITAL_BASED_OUTPATIENT_CLINIC_OR_DEPARTMENT_OTHER): Payer: Medicare Other

## 2015-04-19 DIAGNOSIS — C9000 Multiple myeloma not having achieved remission: Secondary | ICD-10-CM | POA: Diagnosis not present

## 2015-04-19 LAB — CBC WITH DIFFERENTIAL/PLATELET
BASO%: 0 % (ref 0.0–2.0)
Basophils Absolute: 0 10*3/uL (ref 0.0–0.1)
EOS%: 0 % (ref 0.0–7.0)
Eosinophils Absolute: 0 10*3/uL (ref 0.0–0.5)
HEMATOCRIT: 35.6 % — AB (ref 38.4–49.9)
HGB: 11.2 g/dL — ABNORMAL LOW (ref 13.0–17.1)
LYMPH#: 1.1 10*3/uL (ref 0.9–3.3)
LYMPH%: 10.7 % — AB (ref 14.0–49.0)
MCH: 32.6 pg (ref 27.2–33.4)
MCHC: 31.5 g/dL — AB (ref 32.0–36.0)
MCV: 103.5 fL — ABNORMAL HIGH (ref 79.3–98.0)
MONO#: 0.7 10*3/uL (ref 0.1–0.9)
MONO%: 7.1 % (ref 0.0–14.0)
NEUT%: 82.2 % — AB (ref 39.0–75.0)
NEUTROS ABS: 8.3 10*3/uL — AB (ref 1.5–6.5)
PLATELETS: 139 10*3/uL — AB (ref 140–400)
RBC: 3.44 10*6/uL — AB (ref 4.20–5.82)
RDW: 14.7 % — ABNORMAL HIGH (ref 11.0–14.6)
WBC: 10.1 10*3/uL (ref 4.0–10.3)

## 2015-04-19 LAB — COMPREHENSIVE METABOLIC PANEL
ALT: 33 U/L (ref 0–55)
AST: 21 U/L (ref 5–34)
Albumin: 2.9 g/dL — ABNORMAL LOW (ref 3.5–5.0)
Alkaline Phosphatase: 136 U/L (ref 40–150)
Anion Gap: 10 mEq/L (ref 3–11)
BILIRUBIN TOTAL: 0.34 mg/dL (ref 0.20–1.20)
BUN: 34.7 mg/dL — AB (ref 7.0–26.0)
CHLORIDE: 101 meq/L (ref 98–109)
CO2: 28 meq/L (ref 22–29)
CREATININE: 1.3 mg/dL (ref 0.7–1.3)
Calcium: 8.5 mg/dL (ref 8.4–10.4)
EGFR: 54 mL/min/{1.73_m2} — ABNORMAL LOW (ref >90)
Glucose: 148 mg/dl — ABNORMAL HIGH (ref 70–140)
Potassium: 4.3 mEq/L (ref 3.5–5.1)
Sodium: 140 mEq/L (ref 136–145)
TOTAL PROTEIN: 6.2 g/dL — AB (ref 6.4–8.3)

## 2015-04-20 LAB — KAPPA/LAMBDA LIGHT CHAINS
IG KAPPA FREE LIGHT CHAIN: 79.3 mg/L — AB (ref 3.30–19.40)
IG LAMBDA FREE LIGHT CHAIN: 16.07 mg/L (ref 5.71–26.30)
Kappa/Lambda FluidC Ratio: 4.93 — ABNORMAL HIGH (ref 0.26–1.65)

## 2015-04-21 LAB — MULTIPLE MYELOMA PANEL, SERUM
ALBUMIN SERPL ELPH-MCNC: 3.1 g/dL (ref 2.9–4.4)
ALBUMIN/GLOB SERPL: 1.2 (ref 0.7–1.7)
ALPHA 1: 0.2 g/dL (ref 0.0–0.4)
ALPHA2 GLOB SERPL ELPH-MCNC: 0.6 g/dL (ref 0.4–1.0)
B-Globulin SerPl Elph-Mcnc: 1.5 g/dL — ABNORMAL HIGH (ref 0.7–1.3)
GLOBULIN, TOTAL: 2.7 g/dL (ref 2.2–3.9)
Gamma Glob SerPl Elph-Mcnc: 0.4 g/dL (ref 0.4–1.8)
IGG (IMMUNOGLOBIN G), SERUM: 446 mg/dL — AB (ref 700–1600)
IGM (IMMUNOGLOBIN M), SRM: 50 mg/dL (ref 15–143)
IgA, Qn, Serum: 744 mg/dL — ABNORMAL HIGH (ref 61–437)
M Protein SerPl Elph-Mcnc: 0.3 g/dL — ABNORMAL HIGH
Total Protein: 5.8 g/dL — ABNORMAL LOW (ref 6.0–8.5)

## 2015-04-26 ENCOUNTER — Ambulatory Visit (HOSPITAL_BASED_OUTPATIENT_CLINIC_OR_DEPARTMENT_OTHER): Payer: Medicare Other | Admitting: Hematology and Oncology

## 2015-04-26 ENCOUNTER — Telehealth: Payer: Self-pay | Admitting: *Deleted

## 2015-04-26 ENCOUNTER — Telehealth: Payer: Self-pay | Admitting: Hematology and Oncology

## 2015-04-26 VITALS — BP 155/89 | HR 57 | Temp 97.5°F | Resp 18 | Wt 156.1 lb

## 2015-04-26 DIAGNOSIS — E46 Unspecified protein-calorie malnutrition: Secondary | ICD-10-CM | POA: Diagnosis not present

## 2015-04-26 DIAGNOSIS — D61810 Antineoplastic chemotherapy induced pancytopenia: Secondary | ICD-10-CM | POA: Diagnosis not present

## 2015-04-26 DIAGNOSIS — N184 Chronic kidney disease, stage 4 (severe): Secondary | ICD-10-CM

## 2015-04-26 DIAGNOSIS — C9000 Multiple myeloma not having achieved remission: Secondary | ICD-10-CM | POA: Diagnosis present

## 2015-04-26 NOTE — Telephone Encounter (Signed)
Home Palliative Care Consult called to [facility] and s/w Kacy.  She will give referral to Billey Chang, NP to make home visit.

## 2015-04-26 NOTE — Progress Notes (Signed)
[place] OFFICE PROGRESS NOTE  Patient Care Team: Lajean Manes, MD as PCP - General (Internal Medicine) Heath Lark, MD as Consulting Physician (Hematology and Oncology)  SUMMARY OF ONCOLOGIC HISTORY:   Multiple myeloma not having achieved remission ([place])   12/07/2014 - 12/14/2014 Hospital Admission The patient was hospitalized for hypertensive urgency and renal failure. He was found to have multiple myeloma   12/08/2014 Imaging Skeletal survey showed old compression fracture without new bone lytic lesion   12/10/2014 Bone Marrow Biopsy [id number] bone marrow biopsy showed 69% plasma cell involvement   12/21/2014 - 02/11/2015 Chemotherapy He was started on dexamethasone and Velcade   01/05/2015 - 01/07/2015 Hospital Admission The patient was hospitalized for altered mental status.   01/25/2015 Adverse Reaction Due to pancytopenia, Revlimid is placed on hold   02/12/2015 - 02/16/2015 Hospital Admission He was admitted to the hospital for failure to thrive    INTERVAL HISTORY: Please see below for problem oriented charting. He returns for further follow-up. The patient's wife struggled to take care of him and has been paying nursing staff round-the-clock to care for him at home. His appetite is poor. He is weak. He has occasional confusion. Denies recent pain of infection.  REVIEW OF SYSTEMS:   Constitutional: Denies fevers, chills or abnormal weight loss Eyes: Denies blurriness of vision Ears, nose, mouth, throat, and face: Denies mucositis or sore throat Respiratory: Denies cough, dyspnea or wheezes Cardiovascular: Denies palpitation, chest discomfort or lower extremity swelling Gastrointestinal:  Denies nausea, heartburn or change in bowel habits Skin: Denies abnormal skin rashes Lymphatics: Denies new lymphadenopathy or easy bruising Neurological:Denies numbness, tingling or new weaknesses Behavioral/Psych: Mood is stable, no new changes  All other systems were  reviewed with the patient and are negative.  I have reviewed the past medical history, past surgical history, social history and family history with the patient and they are unchanged from previous note.  ALLERGIES:  is allergic to bee venom; oxycodone; and tramadol.  MEDICATIONS:  Current Outpatient Prescriptions  Medication Sig Dispense Refill  . acetaminophen (TYLENOL) 500 MG tablet Take 1,000 mg by mouth every 6 (six) hours as needed for moderate pain.    Marland Kitchen amiodarone (PACERONE) 400 MG tablet Take 1 tablet (400 mg total) by mouth 2 (two) times daily. 60 tablet 0  . aspirin 81 MG tablet Take 81 mg by mouth daily.    Marland Kitchen dexamethasone (DECADRON) 4 MG tablet Take 4 mg by mouth daily.    Marland Kitchen donepezil (ARICEPT) 10 MG tablet Take 10 mg by mouth at bedtime.    Marland Kitchen doxazosin (CARDURA) 8 MG tablet Take 8 mg by mouth daily.  3  . feeding supplement (BOOST HIGH PROTEIN) LIQD Take 1 Container by mouth every morning.    . hydrALAZINE (APRESOLINE) 10 MG tablet Take 10 mg by mouth 2 (two) times daily.    . memantine (NAMENDA) 10 MG tablet Take 10 mg by mouth 2 (two) times daily.    . metoprolol tartrate (LOPRESSOR) 25 MG tablet Take 25 mg by mouth 2 (two) times daily.    . Multiple Vitamin (MULTIVITAMIN WITH MINERALS) TABS tablet Take 1 tablet by mouth daily.    . polyethylene glycol (MIRALAX / GLYCOLAX) packet Take 17 g by mouth daily as needed.     No current facility-administered medications for this visit.    PHYSICAL EXAMINATION: ECOG PERFORMANCE STATUS: 3 - Symptomatic, >50% confined to bed  Filed Vitals:   04/26/15 1045  BP: 155/89  Pulse: 57  Temp: 97.5 F (36.4 C)  Resp: 18   Filed Weights   04/26/15 1045  Weight: 156 lb 1.6 oz (70.806 kg)    GENERAL:alert, no distress and comfortable. He looks to debilitated, sitting on a wheelchair SKIN: skin color, texture, turgor are normal, no rashes or significant lesions EYES: normal, Conjunctiva are pink and non-injected, sclera  clear Musculoskeletal:no cyanosis of digits and no clubbing  NEURO: alert & oriented x 3 with fluent speech, no focal motor/sensory deficits  LABORATORY DATA:  I have reviewed the data as listed    Component Value Date/Time   NA 140 04/19/2015 1108   NA 139 02/18/2015 1050   K 4.3 04/19/2015 1108   K 4.3 02/18/2015 1050   CL 108 02/18/2015 1050   CO2 28 04/19/2015 1108   CO2 25 02/18/2015 1050   GLUCOSE 148* 04/19/2015 1108   GLUCOSE 119* 02/18/2015 1050   BUN 34.7* 04/19/2015 1108   BUN 39* 02/18/2015 1050   CREATININE 1.3 04/19/2015 1108   CREATININE 1.58* 02/18/2015 1050   CALCIUM 8.5 04/19/2015 1108   CALCIUM 8.4* 02/18/2015 1050   PROT 6.2* 04/19/2015 1108   PROT 5.8* 04/19/2015 1108   PROT 4.9* 02/18/2015 1050   ALBUMIN 2.9* 04/19/2015 1108   ALBUMIN 2.7* 02/18/2015 1050   AST 21 04/19/2015 1108   AST 25 02/18/2015 1050   ALT 33 04/19/2015 1108   ALT 30 02/18/2015 1050   ALKPHOS 136 04/19/2015 1108   ALKPHOS 80 02/18/2015 1050   BILITOT 0.34 04/19/2015 1108   BILITOT 0.7 02/18/2015 1050   GFRNONAA 37* 02/18/2015 1050   GFRAA 43* 02/18/2015 1050    No results found for: SPEP, UPEP  Lab Results  Component Value Date   WBC 10.1 04/19/2015   NEUTROABS 8.3* 04/19/2015   HGB 11.2* 04/19/2015   HCT 35.6* 04/19/2015   MCV 103.5* 04/19/2015   PLT 139* 04/19/2015      Chemistry      Component Value Date/Time   NA 140 04/19/2015 1108   NA 139 02/18/2015 1050   K 4.3 04/19/2015 1108   K 4.3 02/18/2015 1050   CL 108 02/18/2015 1050   CO2 28 04/19/2015 1108   CO2 25 02/18/2015 1050   BUN 34.7* 04/19/2015 1108   BUN 39* 02/18/2015 1050   CREATININE 1.3 04/19/2015 1108   CREATININE 1.58* 02/18/2015 1050      Component Value Date/Time   CALCIUM 8.5 04/19/2015 1108   CALCIUM 8.4* 02/18/2015 1050   ALKPHOS 136 04/19/2015 1108   ALKPHOS 80 02/18/2015 1050   AST 21 04/19/2015 1108   AST 25 02/18/2015 1050   ALT 33 04/19/2015 1108   ALT 30 02/18/2015 1050    BILITOT 0.34 04/19/2015 1108   BILITOT 0.7 02/18/2015 1050       ASSESSMENT & PLAN:  Multiple myeloma not having achieved remission ([place]) The patient continues to have very poor performance status. I recommend discontinuation of all treatment and focus on supportive management and palliative care. His wife agreed with the plan of care. I recommend he continues on acyclovir until his current prescription is finished I will consult palliative care/hospice service  Pancytopenia due to chemotherapy [facility]) This is likely due to recent treatment. The patient denies recent history of bleeding such as epistaxis, hematuria or hematochezia. He is asymptomatic from the low platelet count. I will observe for now.  As mentioned above, I will stop his chemotherapy and focus on supportive care.    Protein calorie malnutrition (  [place])  The patient is noted to have protein calorie malnutrition. He has lost some weight recently. Encouraged the patient to increase oral intake as tolerated       Orders Placed This Encounter  Procedures  . Comprehensive metabolic panel    Standing Status: Future     Number of Occurrences:      Standing Expiration Date: 05/30/2016  . CBC with Differential/Platelet    Standing Status: Future     Number of Occurrences:      Standing Expiration Date: 05/30/2016  . Multiple Myeloma Panel (SPEP&IFE w/QIG)    Standing Status: Future     Number of Occurrences:      Standing Expiration Date: 05/30/2016  . Kappa/lambda light chains    Standing Status: Future     Number of Occurrences:      Standing Expiration Date: 05/30/2016   All questions were answered. The patient knows to call the clinic with any problems, questions or concerns. No barriers to learning was detected. I spent 15 minutes counseling the patient face to face. The total time spent in the appointment was 20 minutes and more than 50% was on counseling and review of test results     [facility], [place], MD 04/26/2015  5:01 PM

## 2015-04-26 NOTE — Telephone Encounter (Signed)
Pt confirmed labs/ov per 01/31 POF, gave pt AVS and Calendar... KJ °

## 2015-04-27 ENCOUNTER — Encounter: Payer: Self-pay | Admitting: Hematology and Oncology

## 2015-04-27 NOTE — Assessment & Plan Note (Signed)
The patient continues to have very poor performance status. I recommend discontinuation of all treatment and focus on supportive management and palliative care. His wife agreed with the plan of care. I recommend he continues on acyclovir until his current prescription is finished I will consult palliative care/hospice service

## 2015-04-27 NOTE — Assessment & Plan Note (Signed)
The patient is noted to have protein calorie malnutrition. He has lost some weight recently. Encouraged the patient to increase oral intake as tolerated

## 2015-04-27 NOTE — Assessment & Plan Note (Signed)
This is likely due to recent treatment. The patient denies recent history of bleeding such as epistaxis, hematuria or hematochezia. He is asymptomatic from the low platelet count. I will observe for now.  As mentioned above, I will stop his chemotherapy and focus on supportive care.

## 2015-04-28 ENCOUNTER — Telehealth: Payer: Self-pay | Admitting: *Deleted

## 2015-04-28 NOTE — Telephone Encounter (Signed)
Call from Billey Chang, NP with [facility].  He is at home of pt and says wife is in agreement with [place] if Dr. Alvy Bimler agrees.   Informed Josh of verbal order ok w/ Dr. Alvy Bimler for [facility].  She will be attending MD and please ask Hospice MD to provide symptom management orders.  Merrily Pew will proceed w/ Hospice admission.

## 2015-04-29 ENCOUNTER — Encounter: Payer: Self-pay | Admitting: *Deleted

## 2015-04-29 ENCOUNTER — Telehealth: Payer: Self-pay | Admitting: *Deleted

## 2015-04-29 NOTE — Telephone Encounter (Signed)
Call from Darryl Ryan, Darryl Ryan, w/ update on pt. His BP has been up at 204/103.  Dr. Felipa Eth added Norvasc 5 mg daily and pt to continue Hydralazine TID.  Pt had confusion and was found on floor this morning by his wife.  He was not injured.  Hospice is arranging for pt to have bed alarm and rails added to hospital bed.

## 2015-05-03 ENCOUNTER — Telehealth: Payer: Self-pay | Admitting: *Deleted

## 2015-05-03 NOTE — Telephone Encounter (Signed)
TC from pt's Hospice nurse requesting ok for saline nasal spray (pt has occasional minor nose bleed), c/o abd gas discomfort and a congested cough. Gave verbal ok for request of antacid/anti-gas med of choice and for guaifenisen cough med and saline nasal spray.

## 2015-05-09 ENCOUNTER — Telehealth: Payer: Self-pay | Admitting: Internal Medicine

## 2015-05-09 NOTE — Telephone Encounter (Signed)
New Message :  Darryl Ryan called in wanting to speak with a nurse about if the pt should get his Amiodarone refilled because his HR has been in the 50's. Please f/u with her

## 2015-05-09 NOTE — Telephone Encounter (Signed)
Spoke with hospice nurse and let her know that he had never seen [facility] and had canceled his appointment in Dec.  She will ask Dr Felipa Eth again.  Will call back if needed.

## 2015-05-17 ENCOUNTER — Telehealth: Payer: Self-pay

## 2015-05-17 ENCOUNTER — Telehealth: Payer: Self-pay | Admitting: *Deleted

## 2015-05-17 NOTE — Telephone Encounter (Signed)
Brandon Melnick, RN Hospice reports there is no injury from the falls, does not believe wife will agree to take pt to ED- but she will ask. They will continue the seraquil for agitation

## 2015-05-17 NOTE — Telephone Encounter (Signed)
Maura called from hospice and is requesting a return call from Dr. Alvy Bimler.  Patient fell 3 x's over the weekend and since the fall has been experiencing an increase in agitation, restlessness with hallucinations.  Patient is getting seraquel however it is not helping with his symptoms.

## 2015-05-17 NOTE — Telephone Encounter (Signed)
He should probably be brought in to the ED for further eval in case he sustained injury during the fall

## 2015-05-20 ENCOUNTER — Telehealth: Payer: Self-pay | Admitting: *Deleted

## 2015-05-20 NOTE — Telephone Encounter (Signed)
Brandon Melnick, RN called from Hospice. States patient has been more agitated. Has started Haldol 2-4 mg every 4 hours as needed.  Is wondering if this would be related to acyclovir?

## 2015-05-23 ENCOUNTER — Telehealth: Payer: Self-pay | Admitting: *Deleted

## 2015-05-23 NOTE — Telephone Encounter (Signed)
LVM for Hospice RN informing Agitation not from Acyclovir but ok to stop Acyclovir per Dr. Alvy Bimler.  Please call back if any questions.

## 2015-05-23 NOTE — Telephone Encounter (Signed)
Not from acyclovir OK to stop acyclovir

## 2015-05-27 ENCOUNTER — Telehealth: Payer: Self-pay | Admitting: *Deleted

## 2015-05-27 NOTE — Telephone Encounter (Signed)
[facility] called to ask about status of Revlimid.  Informed Pharmacy of Revlimid has been discontinued d/t disease progression and pt on Hospice.

## 2015-06-22 ENCOUNTER — Telehealth: Payer: Self-pay | Admitting: *Deleted

## 2015-06-22 NOTE — Telephone Encounter (Signed)
Call from Hospice RN, Brandon Melnick.   States pt w/ increased periods of agitation.  Wife doesn't like the effect Haldol has on pt, makes his like a "zombie."   So Hospice has increased dose of Seroquel to BID.  He also seems to have some submandibular swelling but it doesn't seem to cause pt any problems.  Wife continues to be ok w/ Symptom management/ Palliative Care measures only.  She still has extra help in the home to assist w/ care.  Serena Colonel for the update for for Hospice to continue to provide symptom management.

## 2015-07-07 ENCOUNTER — Telehealth: Payer: Self-pay | Admitting: *Deleted

## 2015-07-07 NOTE — Telephone Encounter (Signed)
Hospice RN, Brandon Melnick,  w/ update on pt's condition.  Pt has had several syncopal episodes in the past few weeks.  He passed out this morning, was incontinent of urine and also vomited.  Caregivers called 911 but then canceled once they were able to get pt up and back to bed.   RN also reports distended abd,  Hyperactive BS and tenderness over right upper quadrant.  Reports pt had BM today.  She gave pt compazine for the n/v.   Suggested to Hospice RN to consult with Hospice MD for any appropriate interventions and to make sure pt is comfortable.   Dr. Alvy Bimler requests Hospice MD provide symptom management orders.

## 2015-07-12 ENCOUNTER — Telehealth: Payer: Self-pay | Admitting: *Deleted

## 2015-07-12 NOTE — Telephone Encounter (Signed)
-----   Message from Heath Lark, MD sent at 07/11/2015 11:22 AM EDT ----- Regarding: appointment next week Can you call his wife to check on him today or tomorrow? If he is still debilitated, Recommend continue hospice care and then proceed to cancel his appt next week

## 2015-07-12 NOTE — Telephone Encounter (Signed)
Pt's wife reports pt's condition continues to decline.  He is getting more agitated and Hospice has increase Seroquel to twice daily and Haldol prn.  Wife says pt is also "passing out"  A lot more frequently.  Says this has been going on since November but getting more frequent.  Their goal is to keep patient at home and keep him comfortable and safe.  She has private duty help around the clock and Hospice home care.  Wife would like to cancel appt here next week stating too much of a hardship for pt to leave the house.   She calls Hospice for any problems or concerns and they will contact us for orders and updates as needed.   I canceled appts next week.

## 2015-07-19 ENCOUNTER — Ambulatory Visit: Payer: Medicare Other | Admitting: Hematology and Oncology

## 2015-07-19 ENCOUNTER — Other Ambulatory Visit: Payer: Medicare Other

## 2015-10-11 ENCOUNTER — Telehealth: Payer: Self-pay | Admitting: *Deleted

## 2015-10-11 NOTE — Telephone Encounter (Signed)
Darryl Melnick, RN from Hospice called with update. States patient is more lethargic, not swallowing or speaking. Pt was disimpacted today and given bath.

## 2015-10-14 ENCOUNTER — Telehealth: Payer: Self-pay

## 2015-10-14 NOTE — Telephone Encounter (Signed)
Glenna from [employer] called to verify Dr Alvy Bimler is at [facility] and she will be signing the death certificate. S/w Tammi RN and told funeral home that yes Dr Alvy Bimler will sign certificate.

## 2015-10-25 DEATH — deceased

## 2017-01-24 IMAGING — CR DG LUMBAR SPINE 2-3V
3 series · 3 of 3 positions shown · non-contrast
Comparison: CT abdomen and pelvis is 10/26/2014.

CLINICAL DATA: Low back and bilateral hip pain for several weeks.
No known injury. Initial encounter.

EXAM:
LUMBAR SPINE - 2-3 VIEW

[t l-spine a.p.]
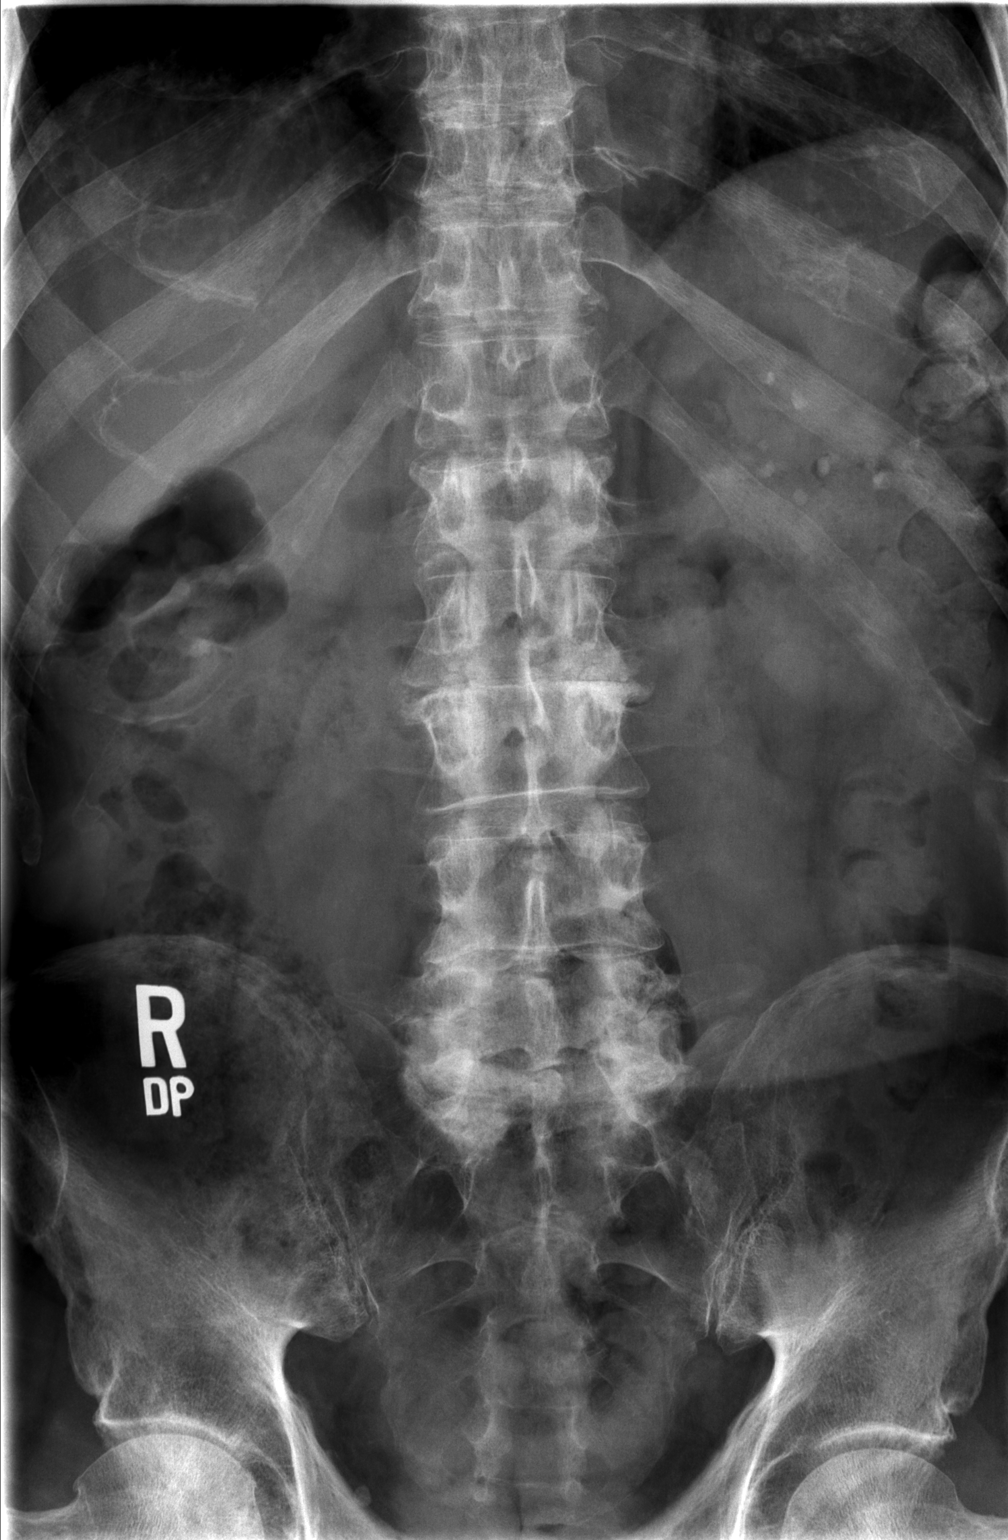

[t l-spine lat]
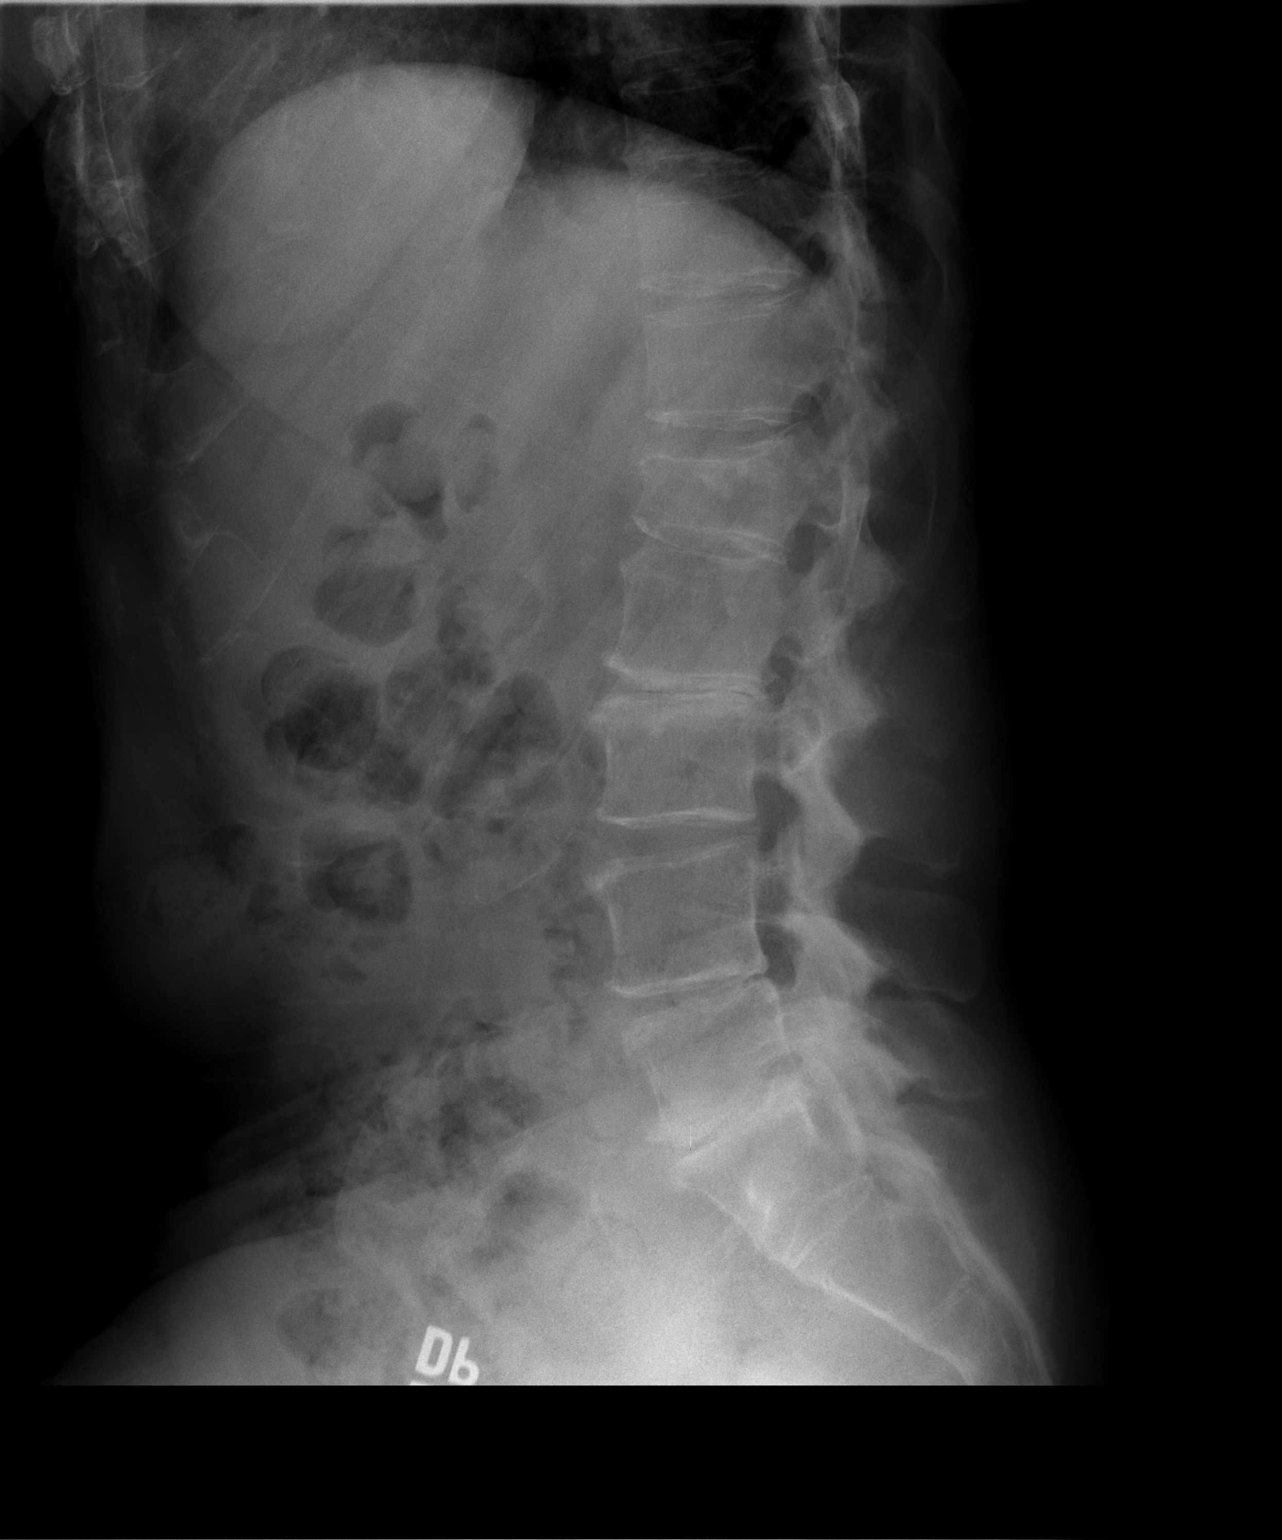

[t l-spine l5-s1 spot]
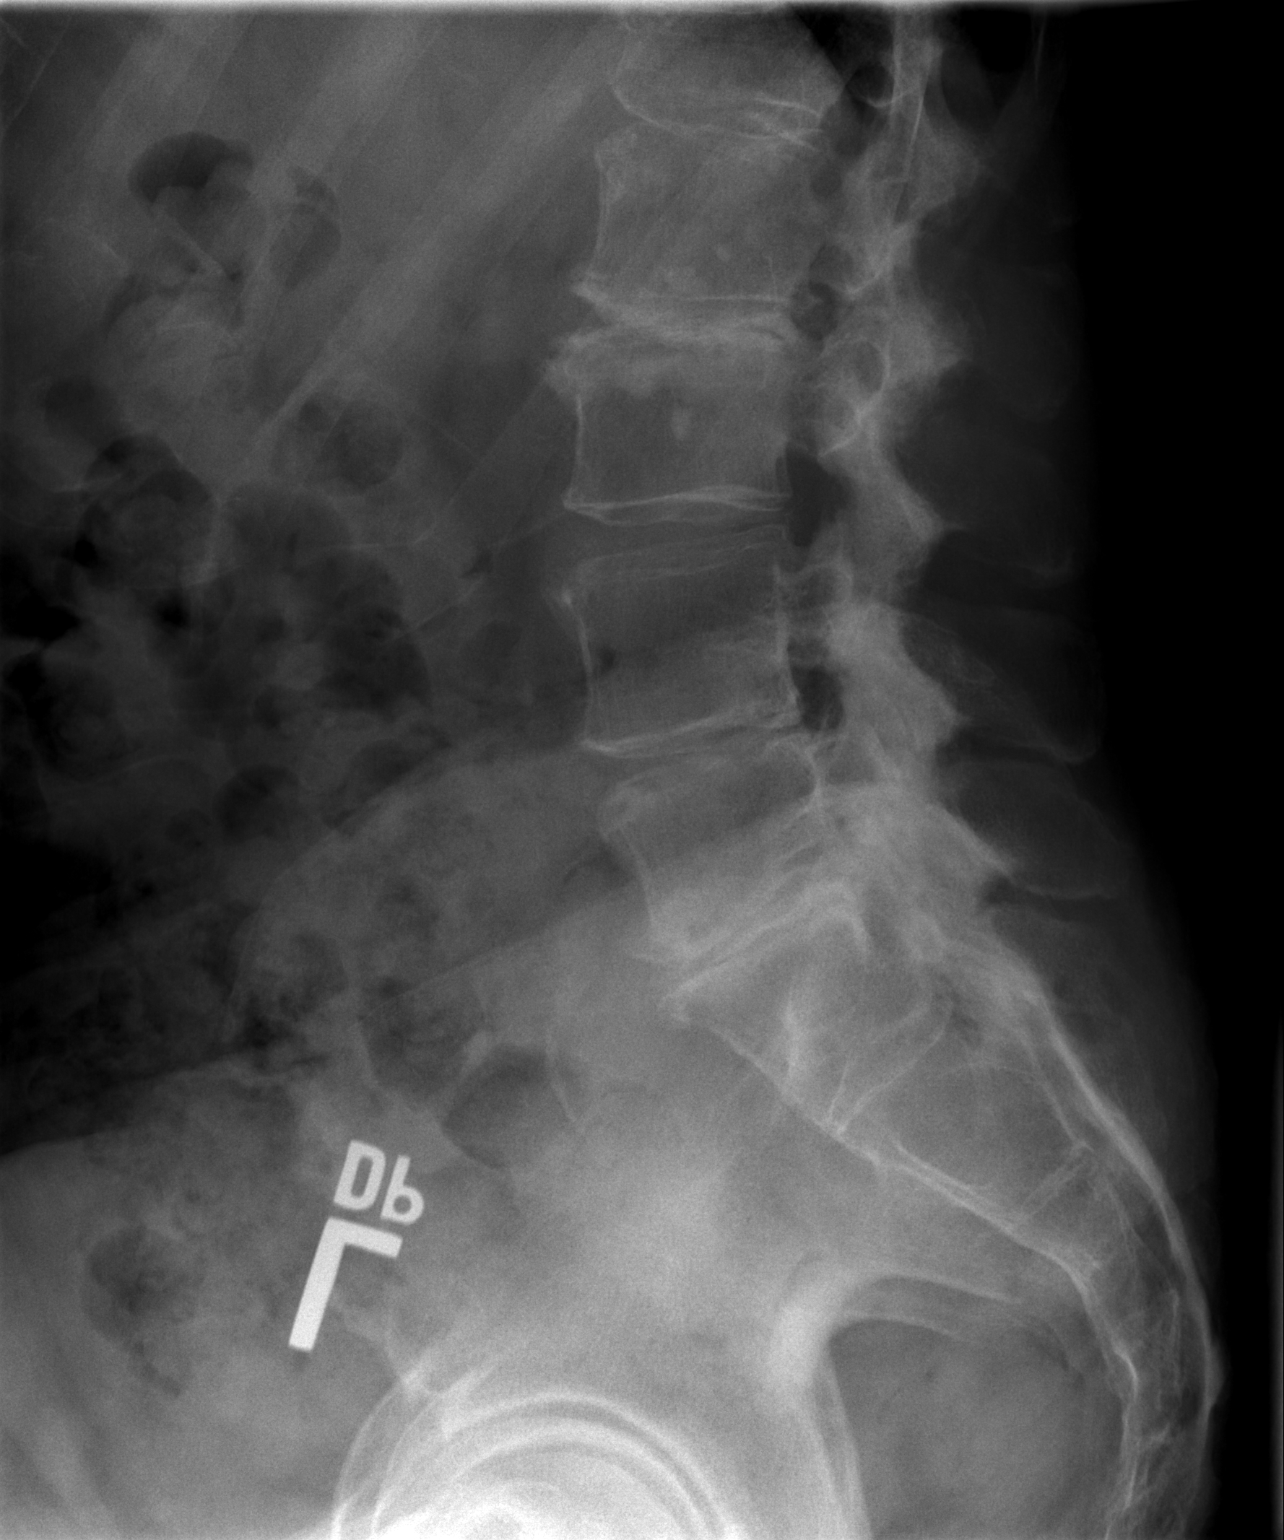

[3 of 3 positions shown; findings below may reference images not displayed]

FINDINGS: The patient has an acute or subacute superior endplate compression
fracture of L1 with vertebral body height loss of approximately 30%.
No other fracture is identified. Multilevel loss of disc space
height is noted. Multiple bilateral renal stones are seen as on the
prior examination.
IMPRESSION: Acute or subacute mild superior endplate compression fracture of L1.

Marked multilevel spondylosis.

Multiple bilateral renal stones.

## 2017-01-26 IMAGING — RF DG ESOPHAGUS
16 of 19 series · 19 of 24 positions shown · non-contrast
Comparison: None.

CLINICAL DATA: Dysphagia

EXAM:
ESOPHOGRAM/BARIUM SWALLOW
TECHNIQUE: Single contrast examination was performed using  thin barium.
FLUOROSCOPY TIME:  Radiation Exposure Index (as provided by the
fluoroscopic device): 144 Gy per sq cm
If the device does not provide the exposure index:
Fluoroscopy Time:  2 minutes 0 second
Number of Acquired Images:

[Series 1: run · 2 of 8 slices shown (1 of 16)]
[im 1/8]
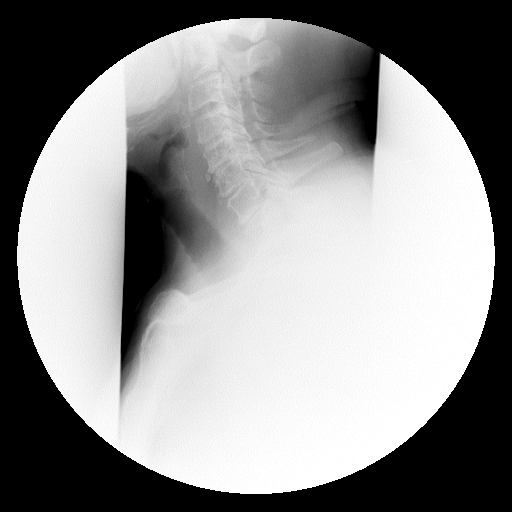
[im 4/8]
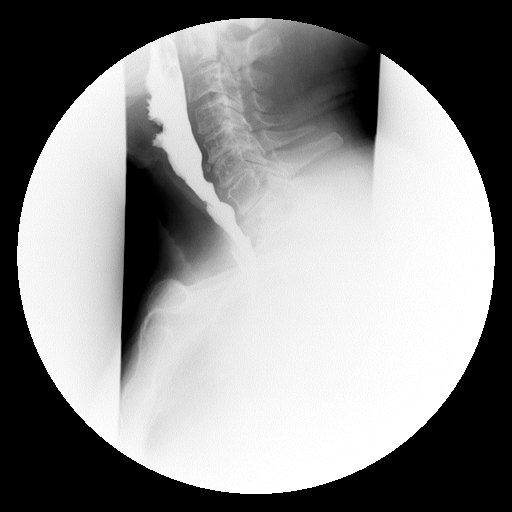

[Series 2: run · 3 of 7 slices shown (2 of 16)]
[im 1/7]
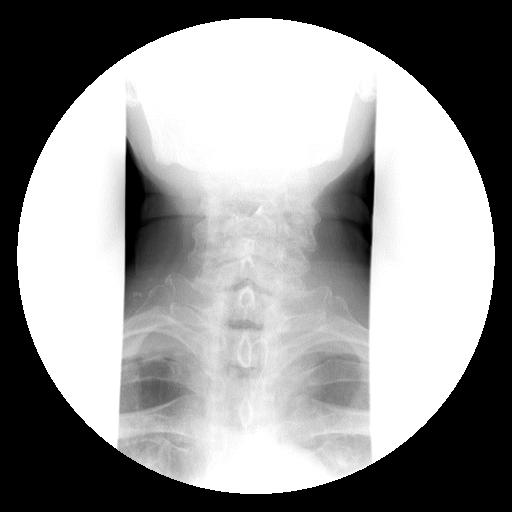
[im 4/7]
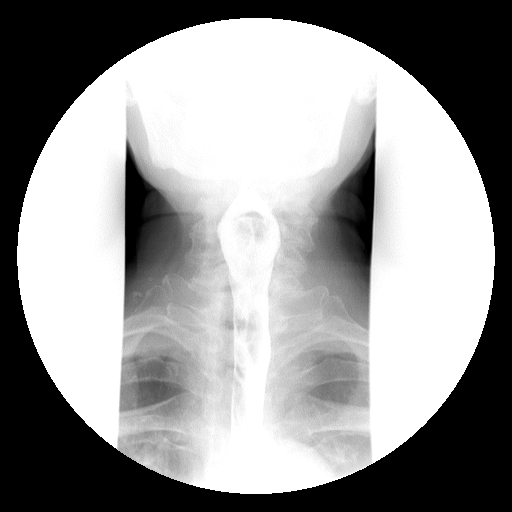
[im 7/7]
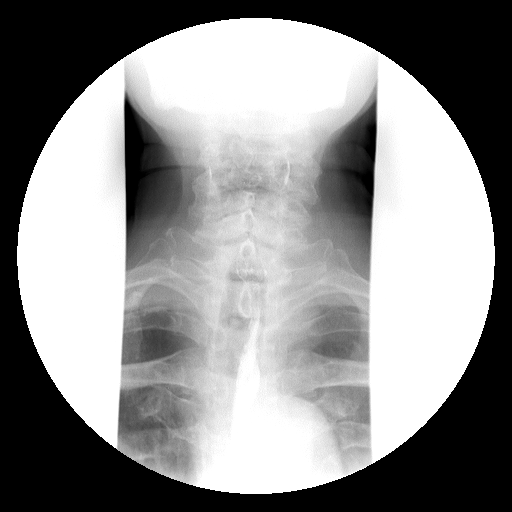

[Series 3: run · 1 of 3 slices shown (3 of 16)]
[im 1/3]
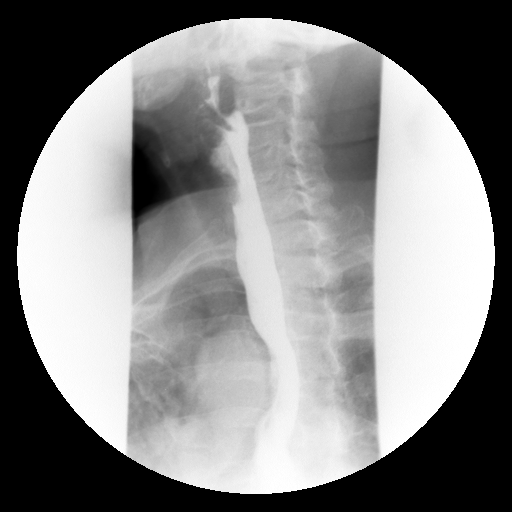

[Series 6: run · 1 of 1 slices shown (4 of 16)]
[im 1/1]
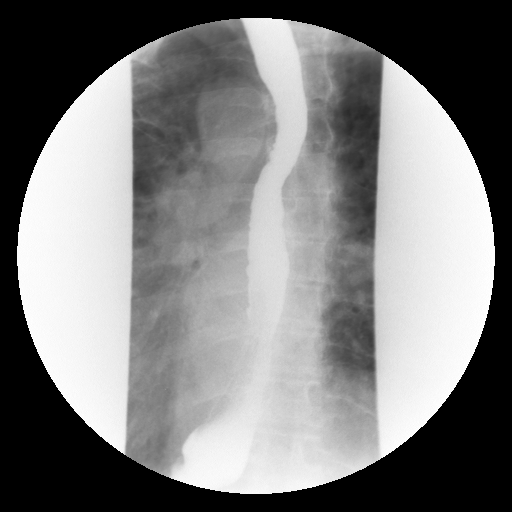

[Series 7: run · 1 of 1 slices shown (5 of 16)]
[im 1/1]
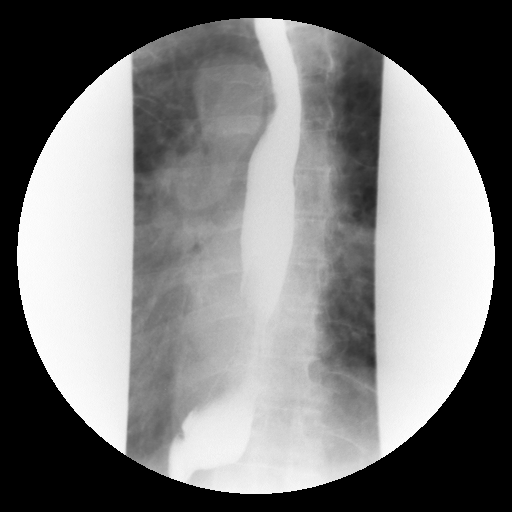

[Series 8: run · 1 of 1 slices shown (6 of 16)]
[im 1/1]
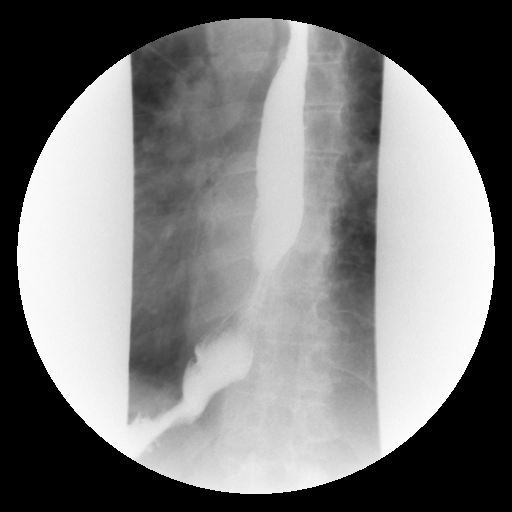

[Series 10: run · 1 of 1 slices shown (7 of 16)]
[im 1/1]
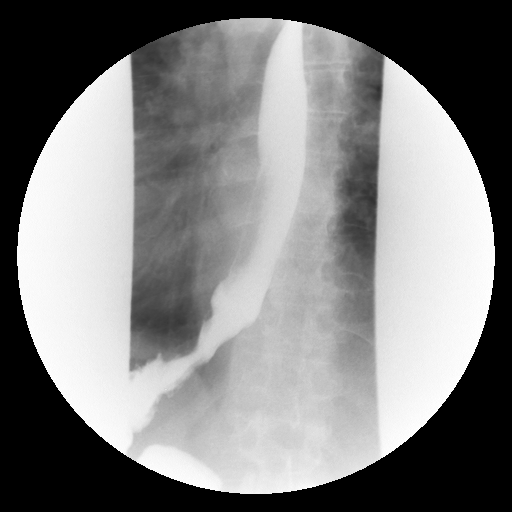

[Series 11: run · 1 of 1 slices shown (8 of 16)]
[im 1/1]
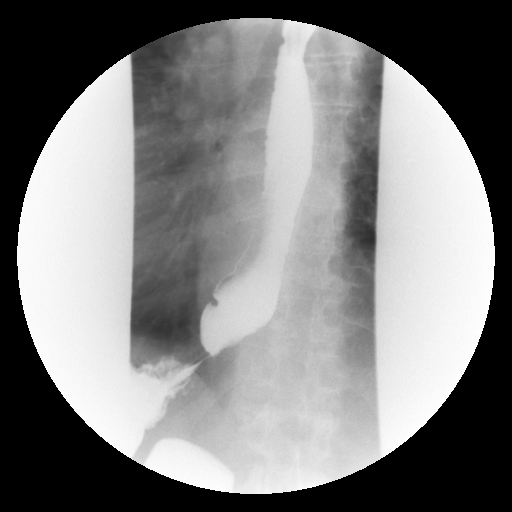

[Series 13: run · 1 of 1 slices shown (9 of 16)]
[im 1/1]
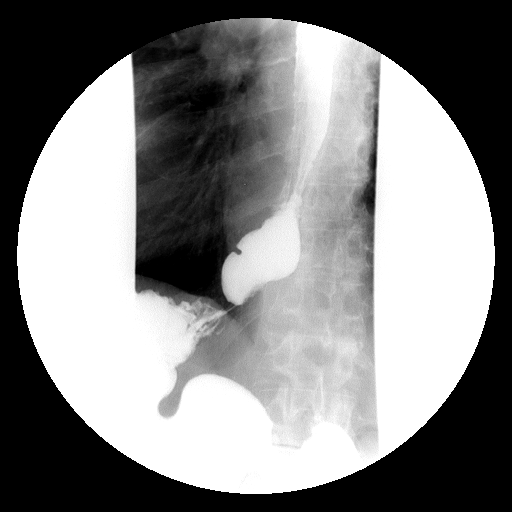

[Series 14: run · 1 of 1 slices shown (10 of 16)]
[im 1/1]
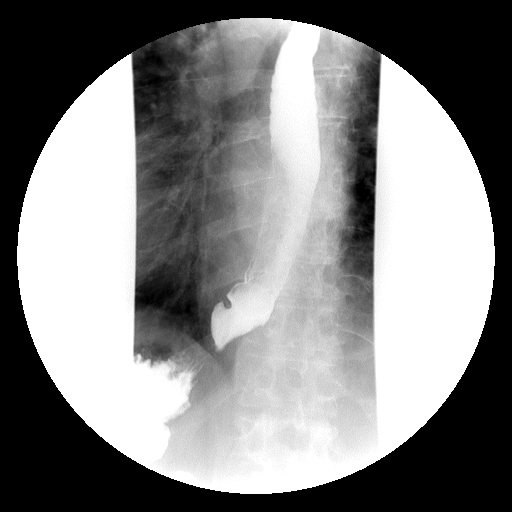

[Series 17: run · 1 of 1 slices shown (11 of 16)]
[im 1/1]
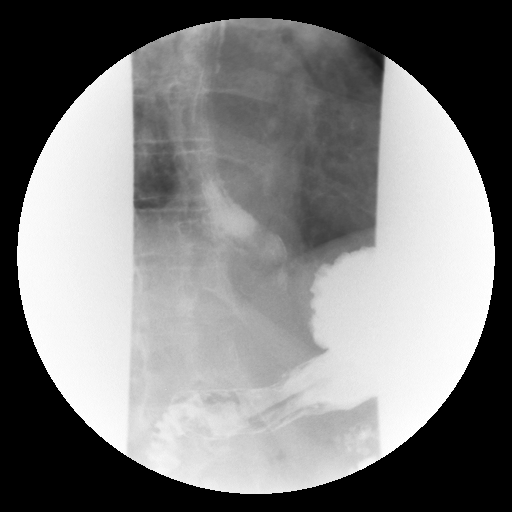

[Series 18: run · 1 of 1 slices shown (12 of 16)]
[im 1/1]
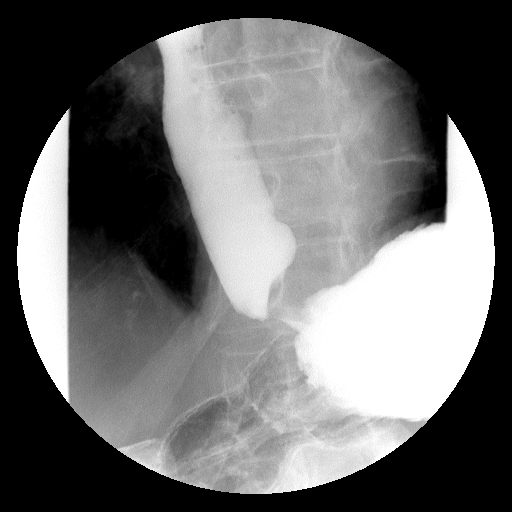

[Series 19: run · 1 of 1 slices shown (13 of 16)]
[im 1/1]
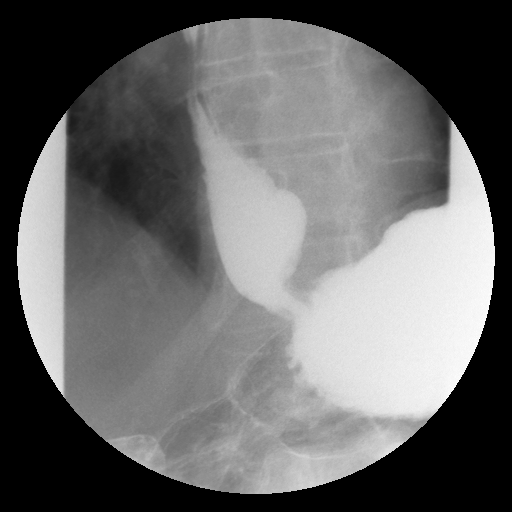

[Series 20: run · 1 of 5 slices shown (14 of 16)]
[im 1/5]
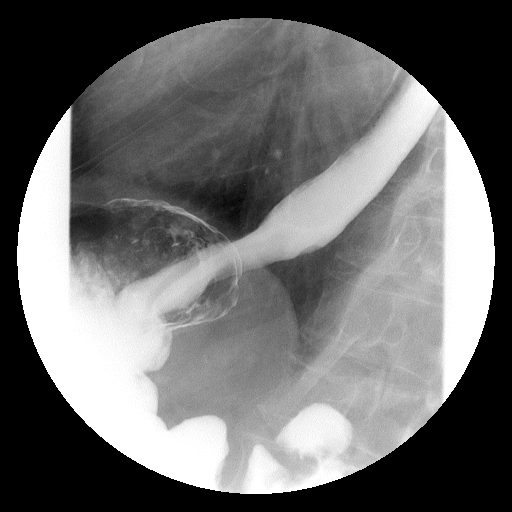

[Series 21: run · 1 of 1 slices shown (15 of 16)]
[im 1/1]
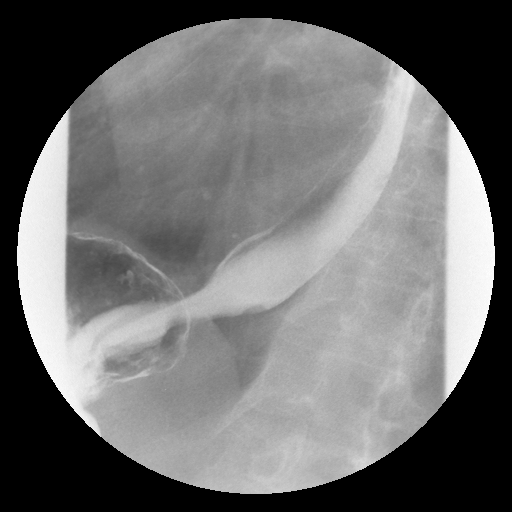

[Series 22: run · 1 of 1 slices shown (16 of 16)]
[im 1/1]
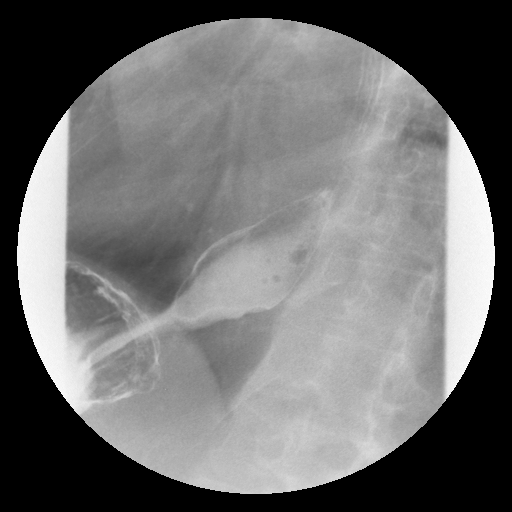

[19 of 24 positions shown; findings below may reference images not displayed]

FINDINGS: The study was begun in the direct lateral projection, to assess for
possible aspiration. Rapid sequence spot films show the swallowing
mechanism to be normal. No aspiration or penetration is seen. Only
mild tertiary contractions are noted in the mid and distal
esophagus. No definite hiatal hernia is seen. However there is an
indentation upon the lower esophagus just above the gastroesophageal
junction. On additional obliques, this appears to represent an
intrinsic polypoid structure, and may represent a distal esophageal
polyp. No gastroesophageal reflux is seen.
IMPRESSION: 1. Only mild tertiary contractions in the mid and distal esophagus.
2. Probable small polyp in the distal esophagus just above the
gastroesophageal junction. No definite hernia or reflux is seen.
3. No aspiration or penetration is noted.
